# Patient Record
Sex: Male | Born: 1955 | Race: White | Hispanic: No | Smoking: Never smoker
Health system: Southern US, Community
[De-identification: ages and names within clinical notes are randomized; demographics above are authoritative.]

## PROBLEM LIST (undated history)

## (undated) DIAGNOSIS — T7840XA Allergy, unspecified, initial encounter: Secondary | ICD-10-CM

## (undated) DIAGNOSIS — K5792 Diverticulitis of intestine, part unspecified, without perforation or abscess without bleeding: Secondary | ICD-10-CM

## (undated) DIAGNOSIS — R112 Nausea with vomiting, unspecified: Secondary | ICD-10-CM

## (undated) DIAGNOSIS — E785 Hyperlipidemia, unspecified: Secondary | ICD-10-CM

## (undated) DIAGNOSIS — Z9889 Other specified postprocedural states: Secondary | ICD-10-CM

## (undated) DIAGNOSIS — I10 Essential (primary) hypertension: Secondary | ICD-10-CM

## (undated) HISTORY — DX: Diverticulitis of intestine, part unspecified, without perforation or abscess without bleeding: K57.92

## (undated) HISTORY — PX: OTHER SURGICAL HISTORY: SHX169

## (undated) HISTORY — PX: HERNIA REPAIR: SHX51

## (undated) HISTORY — DX: Allergy, unspecified, initial encounter: T78.40XA

## (undated) HISTORY — DX: Hyperlipidemia, unspecified: E78.5

## (undated) HISTORY — PX: EYE SURGERY: SHX253

## (undated) HISTORY — DX: Essential (primary) hypertension: I10

---

## 2009-12-24 HISTORY — PX: COLONOSCOPY: SHX174

## 2010-01-19 LAB — HM COLONOSCOPY

## 2012-01-09 ENCOUNTER — Ambulatory Visit (INDEPENDENT_AMBULATORY_CARE_PROVIDER_SITE_OTHER): Payer: BC Managed Care – PPO

## 2012-01-09 DIAGNOSIS — J019 Acute sinusitis, unspecified: Secondary | ICD-10-CM

## 2012-01-09 DIAGNOSIS — H66009 Acute suppurative otitis media without spontaneous rupture of ear drum, unspecified ear: Secondary | ICD-10-CM

## 2012-01-26 ENCOUNTER — Ambulatory Visit (INDEPENDENT_AMBULATORY_CARE_PROVIDER_SITE_OTHER): Payer: BC Managed Care – PPO | Admitting: Family Medicine

## 2012-01-26 VITALS — BP 118/76 | HR 78 | Temp 98.2°F | Resp 18 | Ht 73.5 in | Wt 214.6 lb

## 2012-01-26 DIAGNOSIS — E785 Hyperlipidemia, unspecified: Secondary | ICD-10-CM

## 2012-01-26 DIAGNOSIS — J019 Acute sinusitis, unspecified: Secondary | ICD-10-CM

## 2012-01-26 DIAGNOSIS — I10 Essential (primary) hypertension: Secondary | ICD-10-CM

## 2012-01-26 MED ORDER — LEVOFLOXACIN 500 MG PO TABS: 500.0000 mg | ORAL_TABLET | Freq: Every day | ORAL | Status: AC

## 2012-01-26 NOTE — Progress Notes (Signed)
  Subjective:    Patient ID: Ricardo Garcia, male    DOB: [birth year], 56 y.o.   MRN: [id number]  Cough The current episode started more than 1 month ago. The problem has been unchanged. The problem occurs constantly. The cough is non-productive. Associated symptoms include headaches and postnasal drip. Pertinent negatives include no chest pain, chills, ear congestion, ear pain, fever or sore throat. He has tried rest for the symptoms. The treatment provided mild relief. There is no history of asthma, bronchitis, COPD or emphysema.  Sore Throat  Associated symptoms include coughing and headaches. Pertinent negatives include no ear pain.  Sinusitis Associated symptoms include coughing and headaches. Pertinent negatives include no chills, ear pain or sore throat.   The [place] he took earlier in January helped but did not clear the infection   Review of Systems  Constitutional: Negative for fever and chills.  HENT: Positive for postnasal drip. Negative for ear pain and sore throat.   Respiratory: Positive for cough.   Cardiovascular: Negative for chest pain.  Neurological: Positive for headaches.       Objective:   Physical Exam  Constitutional: He is oriented to person, place, and time. He appears well-developed and well-nourished.  HENT:  Head: Normocephalic and atraumatic.  Right Ear: External ear normal.  Left Ear: External ear normal.  Eyes: Conjunctivae are normal. Pupils are equal, round, and reactive to light.  Neck: Normal range of motion. Neck supple.  Cardiovascular: Normal rate and normal heart sounds.   Pulmonary/Chest: Effort normal and breath sounds normal.  Neurological: He is alert and oriented to person, place, and time.  Skin: Skin is warm and dry.          Assessment & Plan:  Acute sinusitis over a month long.  Partial improvement with omnicef

## 2012-01-26 NOTE — Patient Instructions (Signed)

## 2012-03-07 ENCOUNTER — Other Ambulatory Visit: Payer: Self-pay | Admitting: Otolaryngology

## 2012-03-18 ENCOUNTER — Other Ambulatory Visit: Payer: Self-pay

## 2012-04-29 ENCOUNTER — Ambulatory Visit: Payer: Self-pay | Admitting: Emergency Medicine

## 2012-06-03 ENCOUNTER — Ambulatory Visit (INDEPENDENT_AMBULATORY_CARE_PROVIDER_SITE_OTHER): Payer: No Typology Code available for payment source | Admitting: Emergency Medicine

## 2012-06-03 ENCOUNTER — Encounter: Payer: Self-pay | Admitting: Emergency Medicine

## 2012-06-03 VITALS — BP 128/81 | HR 78 | Temp 97.7°F | Resp 16 | Ht 73.0 in | Wt 219.3 lb

## 2012-06-03 DIAGNOSIS — E785 Hyperlipidemia, unspecified: Secondary | ICD-10-CM

## 2012-06-03 DIAGNOSIS — I10 Essential (primary) hypertension: Secondary | ICD-10-CM

## 2012-06-03 DIAGNOSIS — E789 Disorder of lipoprotein metabolism, unspecified: Secondary | ICD-10-CM

## 2012-06-03 LAB — LIPID PANEL
Cholesterol: 165 mg/dL (ref 0–200)
Triglycerides: 77 mg/dL (ref <150)

## 2012-06-03 LAB — COMPREHENSIVE METABOLIC PANEL
Albumin: 4.4 g/dL (ref 3.5–5.2)
CO2: 26 mEq/L (ref 19–32)
Glucose, Bld: 102 mg/dL — ABNORMAL HIGH (ref 70–99)
Potassium: 4.5 mEq/L (ref 3.5–5.3)
Sodium: 138 mEq/L (ref 135–145)
Total Protein: 7 g/dL (ref 6.0–8.3)

## 2012-06-03 MED ORDER — SIMVASTATIN 40 MG PO TABS: 40.0000 mg | ORAL_TABLET | ORAL | Status: DC

## 2012-06-03 MED ORDER — CLONIDINE HCL 0.1 MG PO TABS: 0.1000 mg | ORAL_TABLET | Freq: Two times a day (BID) | ORAL | Status: DC

## 2012-06-03 MED ORDER — LISINOPRIL 10 MG PO TABS: 10.0000 mg | ORAL_TABLET | Freq: Every day | ORAL | Status: DC

## 2012-06-03 NOTE — Progress Notes (Signed)
  Subjective:    Patient ID: Ricardo Garcia, male    DOB: 04/29/1956, 56 y.o.   MRN: [id number]  HPI patient in followup high blood pressure and high cholesterol everything is essentially the same. Patient has no chest pain shortness of breath or other new complaints.    Review of Systems     Objective:   Physical Exam Betti Cruz complected male who is not in acute distress. His neck is supple chest clear heart regular rate no murmurs abdomen        Assessment & Plan:  Blood pressure is under good control we'll check a lipid panel make further changes at that time meds were refilled x1 year

## 2012-10-19 ENCOUNTER — Ambulatory Visit (INDEPENDENT_AMBULATORY_CARE_PROVIDER_SITE_OTHER): Payer: No Typology Code available for payment source | Admitting: Family Medicine

## 2012-10-19 VITALS — BP 149/94 | HR 79 | Temp 98.1°F | Resp 16 | Ht 73.0 in | Wt 227.4 lb

## 2012-10-19 DIAGNOSIS — J329 Chronic sinusitis, unspecified: Secondary | ICD-10-CM

## 2012-10-19 MED ORDER — PREDNISONE 20 MG PO TABS: ORAL_TABLET | ORAL | Status: DC

## 2012-10-19 MED ORDER — AMOXICILLIN-POT CLAVULANATE 875-125 MG PO TABS: 1.0000 | ORAL_TABLET | Freq: Two times a day (BID) | ORAL | Status: DC

## 2012-10-19 NOTE — Progress Notes (Signed)
[facility] [address], [place] [address] [phone]  Date:  10/19/2012   Name:  Ricardo Garcia   DOB:  [birth year]   MRN:  [id number]  PCP:  No primary provider on file.    Chief Complaint: Sinusitis   History of Present Illness:  Ricardo Garcia is a 56 y.o. very pleasant male patient who presents with the following:  He is here today with persistent sinus pressure and an "all day" dull headache.  He has noted these symptoms for about one month.  He has had this problem in the past several times.  He has gotten better with prednisone and antibiotics in the past. Nasal sprays have not been helpful    No fever, chills or aches. He notes a frontal HA, pressure and pain in his sinuses.  No tooth pain.  Minimal cough.  Nasal discharge is worse in the am.  No ST or earache.    He has tried sudafed- this does help.  No GI symptoms. No sneezing or itchy, runny eyes.  No other neurologic symptoms such as numbness, weakness, dropping items or coordination problems.    Patient Active Problem List  Diagnosis  . Hypertension  . Hyperlipidemia    Past Medical History  Diagnosis Date  . Hypertension     Past Surgical History  Procedure Date  . Hernia repair     History  Substance Use Topics  . Smoking status: Never Smoker   . Smokeless tobacco: Not on file  . Alcohol Use: 3.5 oz/week    7 drink(s) per week    Family History  Problem Relation Age of Onset  . Hypertension Mother     No Known Allergies  Medication list has been reviewed and updated.  Current Outpatient Prescriptions on File Prior to Visit  Medication Sig Dispense Refill  . aspirin 81 MG tablet Take 160 mg by mouth daily.      . cloNIDine (CATAPRES) 0.1 MG tablet Take 1 tablet (0.1 mg total) by mouth 2 (two) times daily.  60 tablet  11  . lisinopril (PRINIVIL,ZESTRIL) 10 MG tablet Take 1 tablet (10 mg total) by mouth daily. Rx is 20 mg takes 1/2 daily  30 tablet  11  .  simvastatin (ZOCOR) 40 MG tablet Take 1 tablet (40 mg total) by mouth every morning.  30 tablet  11    Review of Systems:  As per HPI- otherwise negative. He has seen ENT in the past for this issue- they considered getting a CT scan but did not do so as he got better.    Physical Examination: Filed Vitals:   10/19/12 0803  BP: 149/94  Pulse: 79  Temp: 98.1 F (36.7 C)  Resp: 16   Filed Vitals:   10/19/12 0803  Height: 6\' 1"  (1.854 m)  Weight: 227 lb 6.4 oz (103.148 kg)   Body mass index is 30.00 kg/(m^2). Ideal Body Weight: Weight in (lb) to have BMI = 25: 189.1   GEN: WDWN, NAD, Non-toxic, A & O x 3, overweight HEENT: Atraumatic, Normocephalic. Neck supple. No masses, No LAD.  Bilateral TM wnl, oropharynx normal.  PEERL,EOMI.   No sinus tenderness to percussion, nasal cavity is inflamed.  Ears and Nose: No external deformity. CV: RRR, No M/G/R. No JVD. No thrill. No extra heart sounds. PULM: CTA B, no wheezes, crackles, rhonchi. No retractions. No resp. distress. No accessory muscle use. EXTR: No c/c/e NEURO Normal gait.  PSYCH: Normally  interactive. Conversant. Not depressed or anxious appearing.  Calm demeanor.    Assessment and Plan: 1. Sinusitis  predniSONE (DELTASONE) 20 MG tablet, amoxicillin-clavulanate (AUGMENTIN) 875-125 MG per tablet   Recurrent sinus symptoms.  He has had success with prednisone and augmentin.  Will treat as below- if not better in the next few days please let me know- Sooner if worse.    Meds ordered this encounter  Medications  . predniSONE (DELTASONE) 20 MG tablet    Sig: Take 3 pills for 2 days, then 2 pills for 3 days, then 1 pill for 3 days    Dispense:  15 tablet    Refill:  0  . amoxicillin-clavulanate (AUGMENTIN) 875-125 MG per tablet    Sig: Take 1 tablet by mouth 2 (two) times daily.    Dispense:  20 tablet    Refill:  0     Mario Coronado, MD

## 2012-10-19 NOTE — Patient Instructions (Addendum)
Please let me know if you are not better in the next few days- in that case we may want to proceed with a CT scan of your sinuses.

## 2012-11-04 ENCOUNTER — Ambulatory Visit: Payer: No Typology Code available for payment source

## 2012-11-04 ENCOUNTER — Ambulatory Visit (INDEPENDENT_AMBULATORY_CARE_PROVIDER_SITE_OTHER): Payer: No Typology Code available for payment source | Admitting: Emergency Medicine

## 2012-11-04 ENCOUNTER — Encounter: Payer: Self-pay | Admitting: Emergency Medicine

## 2012-11-04 VITALS — BP 148/91 | HR 79 | Temp 98.2°F | Resp 16 | Ht 73.0 in | Wt 226.0 lb

## 2012-11-04 DIAGNOSIS — J3489 Other specified disorders of nose and nasal sinuses: Secondary | ICD-10-CM

## 2012-11-04 DIAGNOSIS — L309 Dermatitis, unspecified: Secondary | ICD-10-CM

## 2012-11-04 DIAGNOSIS — R195 Other fecal abnormalities: Secondary | ICD-10-CM

## 2012-11-04 DIAGNOSIS — Z0000 Encounter for general adult medical examination without abnormal findings: Secondary | ICD-10-CM

## 2012-11-04 DIAGNOSIS — J329 Chronic sinusitis, unspecified: Secondary | ICD-10-CM

## 2012-11-04 DIAGNOSIS — J019 Acute sinusitis, unspecified: Secondary | ICD-10-CM

## 2012-11-04 DIAGNOSIS — I10 Essential (primary) hypertension: Secondary | ICD-10-CM

## 2012-11-04 LAB — CBC WITH DIFFERENTIAL/PLATELET
Eosinophils Absolute: 0.1 10*3/uL (ref 0.0–0.7)
Eosinophils Relative: 2 % (ref 0–5)
HCT: 42 % (ref 39.0–52.0)
Hemoglobin: 14.6 g/dL (ref 13.0–17.0)
Lymphs Abs: 0.9 10*3/uL (ref 0.7–4.0)
MCH: 31.4 pg (ref 26.0–34.0)
MCV: 90.3 fL (ref 78.0–100.0)
Monocytes Absolute: 0.6 10*3/uL (ref 0.1–1.0)
Monocytes Relative: 9 % (ref 3–12)
RBC: 4.65 MIL/uL (ref 4.22–5.81)

## 2012-11-04 LAB — LIPID PANEL
Cholesterol: 182 mg/dL (ref 0–200)
HDL: 68 mg/dL (ref >39)
Total CHOL/HDL Ratio: 2.7 Ratio

## 2012-11-04 LAB — TSH: TSH: 1.21 u[IU]/mL (ref 0.350–4.500)

## 2012-11-04 LAB — COMPREHENSIVE METABOLIC PANEL
CO2: 27 mEq/L (ref 19–32)
Calcium: 9.9 mg/dL (ref 8.4–10.5)
Chloride: 103 mEq/L (ref 96–112)
Creat: 1.05 mg/dL (ref 0.50–1.35)
Glucose, Bld: 117 mg/dL — ABNORMAL HIGH (ref 70–99)
Total Bilirubin: 0.6 mg/dL (ref 0.3–1.2)

## 2012-11-04 LAB — POCT URINALYSIS DIPSTICK
Bilirubin, UA: NEGATIVE
Blood, UA: NEGATIVE
Glucose, UA: NEGATIVE
Nitrite, UA: NEGATIVE
Spec Grav, UA: >=1.03
pH, UA: 6.5

## 2012-11-04 LAB — POCT UA - MICROSCOPIC ONLY
Bacteria, U Microscopic: NEGATIVE
Casts, Ur, LPF, POC: NEGATIVE
Mucus, UA: NEGATIVE
WBC, Ur, HPF, POC: NEGATIVE
Yeast, UA: NEGATIVE

## 2012-11-04 MED ORDER — AMOXICILLIN-POT CLAVULANATE 875-125 MG PO TABS: 1.0000 | ORAL_TABLET | Freq: Two times a day (BID) | ORAL | Status: DC

## 2012-11-04 MED ORDER — PREDNISONE 20 MG PO TABS: ORAL_TABLET | ORAL | Status: DC

## 2012-11-04 MED ORDER — CLOBETASOL PROPIONATE 0.05 % EX SOLN: 1.0000 "application " | Freq: Two times a day (BID) | CUTANEOUS | Status: DC

## 2012-11-04 MED ORDER — HALOBETASOL PROPIONATE 0.05 % EX CREA: TOPICAL_CREAM | Freq: Two times a day (BID) | CUTANEOUS | Status: DC

## 2012-11-04 NOTE — Progress Notes (Signed)
  Subjective:    Patient ID: Ricardo Garcia, male    DOB: [birth year], 56 y.o.   MRN: [id number]  HPI patient here for physical examination. Apparently last year he has significant problem with sinusitis. This was treated eventually with Cleocin through Dr. Jodi Marble office. He recently completed a course of Augmentin and prednisone but continues to have mild sinus congestion drippy nose    Review of Systems  Constitutional: Negative.   HENT: Positive for congestion.   Eyes: Negative.   Respiratory: Negative.   Cardiovascular: Negative.   Gastrointestinal: Negative.   Genitourinary: Negative.   Musculoskeletal: Negative.   Skin: Negative.   Neurological: Negative.   Hematological: Negative.   Psychiatric/Behavioral: Negative.        Objective:   Physical Exam HEENT exam reveals redness of the turbinates there is mild sinus pain with compression over the maxillary sinuses. Throat is clear. Neck supple chest clear cardiac reveals a regular rate without murmurs there are no carotid bruits. The abdomen is soft liver and spleen not enlarged there is no tenderness GU is that of a normal male without hernias rectal reveals a normal sized prostate no nodules are felt stool is obtained for heme sure  Results for orders placed in visit on 11/04/12  POCT UA - MICROSCOPIC ONLY      Component Value Range   WBC, Ur, HPF, POC neg     RBC, urine, microscopic neg     Bacteria, U Microscopic neg     Mucus, UA neg     Epithelial cells, urine per micros 0-1     Crystals, Ur, HPF, POC neg     Casts, Ur, LPF, POC neg     Yeast, UA neg    POCT URINALYSIS DIPSTICK      Component Value Range   Color, UA yellow     Clarity, UA clear     Glucose, UA neg     Bilirubin, UA neg     Ketones, UA neg     Spec Grav, UA >=1.030     Blood, UA neg     pH, UA 6.5     Protein, UA neg     Urobilinogen, UA 0.2     Nitrite, UA neg     Leukocytes, UA Negative    IFOBT (OCCULT BLOOD)      Component Value Range     IFOBT Positive     [facility] reading (PRIMARY) by  Dr.Stephen Turnbaugh no evidence of sinusitis       Assessment & Plan:  Patient having recurrent problems with sinusitis. His blood pressure is not at goal and have encouraged him to continue to exercise and try and drop some weight since he has not exercised for the last 2 months. I've refilled his eczema medications. I've also refilled his Augmentin and prednisone that he recently took for sinusitis. His hemo  sure was positive here today patient will return in 6 weeks and we will repeat his positive heme sure that it is not still positive.

## 2012-11-05 ENCOUNTER — Encounter: Payer: Self-pay | Admitting: Physician Assistant

## 2012-11-07 ENCOUNTER — Telehealth: Payer: Self-pay

## 2012-11-07 NOTE — Telephone Encounter (Signed)
Patient is returning our phone call regarding his lab results.    BEST#:[phone]

## 2012-11-08 NOTE — Telephone Encounter (Signed)
See labs 

## 2012-12-20 ENCOUNTER — Ambulatory Visit (INDEPENDENT_AMBULATORY_CARE_PROVIDER_SITE_OTHER): Payer: No Typology Code available for payment source | Admitting: Family Medicine

## 2012-12-20 VITALS — BP 121/78 | HR 81 | Temp 98.7°F | Resp 16 | Ht 73.5 in | Wt 225.0 lb

## 2012-12-20 DIAGNOSIS — J329 Chronic sinusitis, unspecified: Secondary | ICD-10-CM

## 2012-12-20 MED ORDER — FLUTICASONE PROPIONATE 50 MCG/ACT NA SUSP: 2.0000 | Freq: Every day | NASAL | Status: DC

## 2012-12-20 MED ORDER — CLINDAMYCIN HCL 300 MG PO CAPS: 300.0000 mg | ORAL_CAPSULE | Freq: Three times a day (TID) | ORAL | Status: DC

## 2012-12-20 NOTE — Progress Notes (Signed)
Subjective: Patient seemed to have problems with his sinuses. He's had 2 cores of a course of Augmentin. He says last year clindamycin finally cleared him. He has seen an ENT about this once. He never did have scan done.  Objective TMs normal. Inflamed his right nasal passage with some dry blood up in there. His throat was clear. Neck supple without nodes. Chest clear. Heart regular without murmurs.  Assessment: Chronic sinusitis  Plan: Clindamycin 300 3 times a day. Discussed the risks. Patient asked me about use of his Nettie pot. I said I was not inclined to encourage those to strongly but it was up for him.  Flonase  Return if not improved. He sees Dr. Cleta Alberts back in several weeks anyhow.

## 2012-12-20 NOTE — Patient Instructions (Addendum)
Take clindamycin as ordered.  If bad diarrhea or any bleeding in stool return to office and discontinue medicine.

## 2013-01-06 ENCOUNTER — Ambulatory Visit: Payer: No Typology Code available for payment source | Admitting: Emergency Medicine

## 2013-01-16 ENCOUNTER — Ambulatory Visit (INDEPENDENT_AMBULATORY_CARE_PROVIDER_SITE_OTHER): Payer: No Typology Code available for payment source | Admitting: Emergency Medicine

## 2013-01-16 VITALS — BP 133/80 | HR 101 | Temp 98.8°F | Resp 18 | Ht 73.0 in | Wt 224.0 lb

## 2013-01-16 DIAGNOSIS — J329 Chronic sinusitis, unspecified: Secondary | ICD-10-CM

## 2013-01-16 DIAGNOSIS — R195 Other fecal abnormalities: Secondary | ICD-10-CM

## 2013-01-16 LAB — IFOBT (OCCULT BLOOD): IFOBT: NEGATIVE

## 2013-01-16 NOTE — Progress Notes (Signed)
  Subjective:    Patient ID: Ricardo Garcia, male    DOB: 1956-10-10, 57 y.o.   MRN: [id number]  HPI patient enters for recheck he tested positive for occult blood. He is in today for recheck he did. He continues to battle with severe recurrent sinusitis. He is seeing Dr. Tia Masker in the past for this and is interested in seeing he denies any GI symptoms the    Review of Systems     Objective:   Physical Exam TMs are clear. Nose is slightly congested. Throat is clear. Neck supple chest was clear examination of the perianal area shows at that area appears normal rectal exam was performed and there were no masses felt he may sure was obtained  Results for orders placed in visit on 01/16/13  IFOBT (OCCULT BLOOD)      Component Value Range   IFOBT Negative          Assessment & Plan:  Stool is now negative for occult blood. Make referral to ENT for chronic sinusitis.

## 2013-04-20 ENCOUNTER — Other Ambulatory Visit: Payer: Self-pay

## 2013-04-20 DIAGNOSIS — E785 Hyperlipidemia, unspecified: Secondary | ICD-10-CM

## 2013-04-20 DIAGNOSIS — I10 Essential (primary) hypertension: Secondary | ICD-10-CM

## 2013-04-20 DIAGNOSIS — J329 Chronic sinusitis, unspecified: Secondary | ICD-10-CM

## 2013-04-20 MED ORDER — SIMVASTATIN 40 MG PO TABS: 40.0000 mg | ORAL_TABLET | ORAL | Status: DC

## 2013-04-20 MED ORDER — FLUTICASONE PROPIONATE 50 MCG/ACT NA SUSP: 2.0000 | Freq: Every day | NASAL | Status: DC

## 2013-04-20 MED ORDER — LISINOPRIL 10 MG PO TABS: 10.0000 mg | ORAL_TABLET | Freq: Every day | ORAL | Status: DC

## 2013-04-20 MED ORDER — CLONIDINE HCL 0.1 MG PO TABS: 0.1000 mg | ORAL_TABLET | Freq: Two times a day (BID) | ORAL | Status: DC

## 2013-07-22 ENCOUNTER — Other Ambulatory Visit: Payer: Self-pay | Admitting: Emergency Medicine

## 2013-07-28 ENCOUNTER — Ambulatory Visit (INDEPENDENT_AMBULATORY_CARE_PROVIDER_SITE_OTHER): Payer: BC Managed Care – PPO | Admitting: Emergency Medicine

## 2013-07-28 ENCOUNTER — Encounter: Payer: Self-pay | Admitting: Emergency Medicine

## 2013-07-28 VITALS — BP 140/82 | HR 65 | Temp 98.5°F | Resp 16 | Ht 73.0 in | Wt 221.0 lb

## 2013-07-28 DIAGNOSIS — L309 Dermatitis, unspecified: Secondary | ICD-10-CM

## 2013-07-28 DIAGNOSIS — J329 Chronic sinusitis, unspecified: Secondary | ICD-10-CM

## 2013-07-28 DIAGNOSIS — I10 Essential (primary) hypertension: Secondary | ICD-10-CM

## 2013-07-28 DIAGNOSIS — E785 Hyperlipidemia, unspecified: Secondary | ICD-10-CM

## 2013-07-28 DIAGNOSIS — Z79899 Other long term (current) drug therapy: Secondary | ICD-10-CM

## 2013-07-28 LAB — CBC WITH DIFFERENTIAL/PLATELET
HCT: 42.8 % (ref 39.0–52.0)
Hemoglobin: 14.9 g/dL (ref 13.0–17.0)
Lymphocytes Relative: 17 % (ref 12–46)
Lymphs Abs: 1.1 10*3/uL (ref 0.7–4.0)
MCHC: 34.8 g/dL (ref 30.0–36.0)
Monocytes Absolute: 0.6 10*3/uL (ref 0.1–1.0)
Monocytes Relative: 9 % (ref 3–12)
Neutro Abs: 4.4 10*3/uL (ref 1.7–7.7)
RBC: 4.69 MIL/uL (ref 4.22–5.81)
WBC: 6.3 10*3/uL (ref 4.0–10.5)

## 2013-07-28 LAB — LIPID PANEL
Cholesterol: 167 mg/dL (ref 0–200)
LDL Cholesterol: 78 mg/dL (ref 0–99)
Total CHOL/HDL Ratio: 2.5 Ratio
Triglycerides: 114 mg/dL (ref <150)
VLDL: 23 mg/dL (ref 0–40)

## 2013-07-28 LAB — COMPREHENSIVE METABOLIC PANEL
ALT: 23 U/L (ref 0–53)
Alkaline Phosphatase: 50 U/L (ref 39–117)
CO2: 28 mEq/L (ref 19–32)
Creat: 1.17 mg/dL (ref 0.50–1.35)
Sodium: 139 mEq/L (ref 135–145)
Total Bilirubin: 0.6 mg/dL (ref 0.3–1.2)
Total Protein: 6.8 g/dL (ref 6.0–8.3)

## 2013-07-28 MED ORDER — LISINOPRIL 10 MG PO TABS: ORAL_TABLET | ORAL | Status: DC

## 2013-07-28 MED ORDER — CLONIDINE HCL 0.1 MG PO TABS: 0.1000 mg | ORAL_TABLET | Freq: Two times a day (BID) | ORAL | Status: DC

## 2013-07-28 MED ORDER — FLUTICASONE PROPIONATE 50 MCG/ACT NA SUSP: NASAL | Status: DC

## 2013-07-28 MED ORDER — HALOBETASOL PROPIONATE 0.05 % EX CREA: TOPICAL_CREAM | Freq: Two times a day (BID) | CUTANEOUS | Status: DC

## 2013-07-28 MED ORDER — SIMVASTATIN 40 MG PO TABS: ORAL_TABLET | ORAL | Status: DC

## 2013-07-28 MED ORDER — CLOBETASOL PROPIONATE 0.05 % EX SOLN: 1.0000 "application " | Freq: Two times a day (BID) | CUTANEOUS | Status: DC

## 2013-07-28 MED ORDER — MUPIROCIN 2 % EX OINT: TOPICAL_OINTMENT | Freq: Every day | CUTANEOUS | Status: DC

## 2013-07-28 NOTE — Progress Notes (Signed)
  Subjective:    Patient ID: Ileene Musa, male    DOB: 10-May-1956, 57 y.o.   MRN: [id number]  HPI patient here to followup hypertension and hyperlipidemia. His son has been having great deal of difficulty with anxiety and depression and he is currently seeing Dr. Merla Riches. He also is bothered with chronic sinus problems which required multiple doses of antibiotics. He currently uses an 80 pod about 3 times a day along with Neosporin and this has seemed to help .    Review of Systems     Objective:   Physical Exam patient is alert and cooperative. His neck is supple. Examination of the nose reveals crusting scab-like areas on the left nares. His chest is clear his heart is regular rate without murmurs abdomen soft nontender and        Assessment & Plan:  He was not happy with the last ENT he saw. I told him I can order a CT of the sinuses or refer him for second opinion from a different ENT. He was to continue his med he pod. I told him I would call in some mucopurulent nasal ointment to use along with an 80 pod and see if this helps. His other medications were refilled. Routine labs were done he is to return in 6 months for his physical

## 2013-08-20 ENCOUNTER — Ambulatory Visit (INDEPENDENT_AMBULATORY_CARE_PROVIDER_SITE_OTHER): Payer: BC Managed Care – PPO | Admitting: Emergency Medicine

## 2013-08-20 VITALS — BP 124/76 | HR 83 | Temp 100.0°F | Resp 16 | Ht 73.0 in | Wt 225.0 lb

## 2013-08-20 DIAGNOSIS — K5732 Diverticulitis of large intestine without perforation or abscess without bleeding: Secondary | ICD-10-CM

## 2013-08-20 DIAGNOSIS — R109 Unspecified abdominal pain: Secondary | ICD-10-CM

## 2013-08-20 LAB — POCT CBC
HCT, POC: 42.7 % — AB (ref 43.5–53.7)
Hemoglobin: 13.5 g/dL — AB (ref 14.1–18.1)
MPV: 8 fL (ref 0–99.8)
POC Granulocyte: 11.3 — AB (ref 2–6.9)
POC MID %: 6.2 %M (ref 0–12)
RBC: 4.37 M/uL — AB (ref 4.69–6.13)

## 2013-08-20 LAB — POCT URINALYSIS DIPSTICK
Blood, UA: NEGATIVE
Nitrite, UA: NEGATIVE
Protein, UA: NEGATIVE
Spec Grav, UA: 1.02
Urobilinogen, UA: 0.2

## 2013-08-20 MED ORDER — METRONIDAZOLE 500 MG PO TABS: 500.0000 mg | ORAL_TABLET | Freq: Two times a day (BID) | ORAL | Status: DC

## 2013-08-20 MED ORDER — CIPROFLOXACIN HCL 500 MG PO TABS: 500.0000 mg | ORAL_TABLET | Freq: Two times a day (BID) | ORAL | Status: DC

## 2013-08-20 NOTE — Patient Instructions (Addendum)
Diverticulitis °A diverticulum is a small pouch or sac on the colon. Diverticulosis is the presence of these diverticula on the colon. Diverticulitis is the irritation (inflammation) or infection of diverticula. °CAUSES  °The colon and its diverticula contain bacteria. If food particles block the tiny opening to a diverticulum, the bacteria inside can grow and cause an increase in pressure. This leads to infection and inflammation and is called diverticulitis. °SYMPTOMS  °· Abdominal pain and tenderness. Usually, the pain is located on the left side of your abdomen. However, it could be located elsewhere. °· Fever. °· Bloating. °· Feeling sick to your stomach (nausea). °· Throwing up (vomiting). °· Abnormal stools. °DIAGNOSIS  °Your caregiver will take a history and perform a physical exam. Since many things can cause abdominal pain, other tests may be necessary. Tests may include: °· Blood tests. °· Urine tests. °· X-ray of the abdomen. °· CT scan of the abdomen. °Sometimes, surgery is needed to determine if diverticulitis or other conditions are causing your symptoms. °TREATMENT  °Most of the time, you can be treated without surgery. Treatment includes: °· Resting the bowels by only having liquids for a few days. As you improve, you will need to eat a low-fiber diet. °· Intravenous (IV) fluids if you are losing body fluids (dehydrated). °· Antibiotic medicines that treat infections may be given. °· Pain and nausea medicine, if needed. °· Surgery if the inflamed diverticulum has burst. °HOME CARE INSTRUCTIONS  °· Try a clear liquid diet (broth, tea, or water for as long as directed by your caregiver). You may then gradually begin a low-fiber diet as tolerated.  °A low-fiber diet is a diet with less than 10 grams of fiber. Choose the foods below to reduce fiber in the diet: °· White breads, cereals, rice, and pasta. °· Cooked fruits and vegetables or soft fresh fruits and vegetables without the skin. °· Ground or  well-cooked tender beef, ham, veal, lamb, pork, or poultry. °· Eggs and seafood. °· After your diverticulitis symptoms have improved, your caregiver may put you on a high-fiber diet. A high-fiber diet includes 14 grams of fiber for every 1000 calories consumed. For a standard 2000 calorie diet, you would need 28 grams of fiber. Follow these diet guidelines to help you increase the fiber in your diet. It is important to slowly increase the amount fiber in your diet to avoid gas, constipation, and bloating. °· Choose whole-grain breads, cereals, pasta, and brown rice. °· Choose fresh fruits and vegetables with the skin on. Do not overcook vegetables because the more vegetables are cooked, the more fiber is lost. °· Choose more nuts, seeds, legumes, dried peas, beans, and lentils. °· Look for food products that have greater than 3 grams of fiber per serving on the Nutrition Facts label. °· Take all medicine as directed by your caregiver. °· If your caregiver has given you a follow-up appointment, it is very important that you go. Not going could result in lasting (chronic) or permanent injury, pain, and disability. If there is any problem keeping the appointment, call to reschedule. °SEEK MEDICAL CARE IF:  °· Your pain does not improve. °· You have a hard time advancing your diet beyond clear liquids. °· Your bowel movements do not return to normal. °SEEK IMMEDIATE MEDICAL CARE IF:  °· Your pain becomes worse. °· You have an oral temperature above 102° F (38.9° C), not controlled by medicine. °· You have repeated vomiting. °· You have bloody or black, tarry stools. °·   Symptoms that brought you to your caregiver become worse or are not getting better. °MAKE SURE YOU:  °· Understand these instructions. °· Will watch your condition. °· Will get help right away if you are not doing well or get worse. °Document Released: 09/19/2005 Document Revised: 03/03/2012 Document Reviewed: 01/15/2011 °[employer]® Patient Information  ©2014 [employer]. ° °

## 2013-08-20 NOTE — Progress Notes (Signed)
[facility] [address], [place] [address] [phone]  Date:  08/20/2013   Name:  Ricardo Garcia   DOB:  [birth year]   MRN:  [id number]  PCP:  No primary provider on file.    Chief Complaint: Abdominal Pain   History of Present Illness:  Ricardo Garcia is a 57 y.o. very pleasant male patient who presents with the following:  Abdominal pain that he describes as suprapubic in location for the past two days.  Fatigued and anorectic.  No dysuria, urgency or frequency.  No discharge.  No prostatism symptoms.  No vomiting or stool change.  Nauseated.    Patient Active Problem List   Diagnosis Date Noted  . Unspecified sinusitis (chronic) 07/28/2013  . Hypertension 01/26/2012  . Hyperlipidemia 01/26/2012    Past Medical History  Diagnosis Date  . Hypertension     Past Surgical History  Procedure Laterality Date  . Hernia repair      History  Substance Use Topics  . Smoking status: Never Smoker   . Smokeless tobacco: Not on file  . Alcohol Use: 3.5 oz/week    7 drink(s) per week    Family History  Problem Relation Age of Onset  . Hypertension Mother     No Known Allergies  Medication list has been reviewed and updated.  Current Outpatient Prescriptions on File Prior to Visit  Medication Sig Dispense Refill  . aspirin 81 MG tablet Take 160 mg by mouth daily.      . cloNIDine (CATAPRES) 0.1 MG tablet Take 1 tablet (0.1 mg total) by mouth 2 (two) times daily.  180 tablet  3  . fluticasone (FLONASE) 50 MCG/ACT nasal spray SPRAY TWICE IN EACH NOSTRIL EVERY DAY  16 g  11  . lisinopril (PRINIVIL,ZESTRIL) 10 MG tablet TAKE 1 TABLET BY MOUTH EVERY DAY  90 tablet  3  . simvastatin (ZOCOR) 40 MG tablet TAKE 1 TABLET BY MOUTH EVERY MORNING  90 tablet  3  . clindamycin (CLEOCIN) 300 MG capsule Take 1 capsule (300 mg total) by mouth 3 (three) times daily.  30 capsule  0  . clobetasol (TEMOVATE) 0.05 % external solution Apply 1 application topically 2  (two) times daily.  50 mL  5  . halobetasol (ULTRAVATE) 0.05 % cream Apply topically 2 (two) times daily.  50 g  5  . mupirocin ointment (BACTROBAN) 2 % Apply topically daily.  22 g  0   No current facility-administered medications on file prior to visit.    Review of Systems:  As per HPI, otherwise negative.     Physical Examination: Filed Vitals:   08/20/13 1716  BP: 124/76  Pulse: 83  Temp: 100 F (37.8 C)  Resp: 16   Filed Vitals:   08/20/13 1716  Height: 6\' 1"  (1.854 m)  Weight: 225 lb (102.059 kg)   Body mass index is 29.69 kg/(m^2). Ideal Body Weight: Weight in (lb) to have BMI = 25: 189.1 GEN: WDWN, NAD, Non-toxic, A & O x 3 HEENT: Atraumatic, Normocephalic. Neck supple. No masses, No LAD. Ears and Nose: No external deformity. CV: RRR, No M/G/R. No JVD. No thrill. No extra heart sounds. PULM: CTA B, no wheezes, crackles, rhonchi. No retractions. No resp. distress. No accessory muscle use. ABD: S, tender LLQ with guarding ND, +BS. Direct and referred LLQ rebound. No HSM. EXTR: No c/c/e NEURO Normal gait.  PSYCH: Normally interactive. Conversant. Not depressed or anxious appearing.  Calm demeanor.  Assessment and Plan: Diverticulitis Flagyl cipro  Signed,  Phillips Odor, MD

## 2013-10-01 ENCOUNTER — Ambulatory Visit: Payer: Self-pay | Admitting: Emergency Medicine

## 2013-10-01 VITALS — BP 118/74 | HR 78 | Temp 98.3°F | Resp 18 | Ht 73.5 in | Wt 212.0 lb

## 2013-10-01 DIAGNOSIS — R109 Unspecified abdominal pain: Secondary | ICD-10-CM

## 2013-10-01 LAB — POCT CBC
MCH, POC: 31.4 pg — AB (ref 27–31.2)
MCV: 97.2 fL — AB (ref 80–97)
MID (cbc): 0.7 (ref 0–0.9)
POC LYMPH PERCENT: 12.3 %L (ref 10–50)
Platelet Count, POC: 229 10*3/uL (ref 142–424)
RBC: 4.72 M/uL (ref 4.69–6.13)
RDW, POC: 13.3 %
WBC: 10.6 10*3/uL — AB (ref 4.6–10.2)

## 2013-10-01 LAB — POCT URINALYSIS DIPSTICK
Glucose, UA: NEGATIVE
Spec Grav, UA: 1.015
Urobilinogen, UA: 0.2

## 2013-10-01 MED ORDER — CIPROFLOXACIN HCL 500 MG PO TABS: 500.0000 mg | ORAL_TABLET | Freq: Two times a day (BID) | ORAL | Status: DC

## 2013-10-01 MED ORDER — METRONIDAZOLE 500 MG PO TABS: 500.0000 mg | ORAL_TABLET | Freq: Two times a day (BID) | ORAL | Status: DC

## 2013-10-01 NOTE — Progress Notes (Signed)
[address]   [place], [place]  [address]   [phone]  Subjective:    Patient ID: Ricardo Garcia, male    DOB: 06-27-1956, 57 y.o.   MRN: [id number]  This chart was scribed for Lesle Chris, MD by Blanchard Kelch, ED Scribe. The patient was seen in room 12. Patient's care was started at 9:45 AM.   HPI  HPI Comments: Ricardo Garcia is a 57 y.o. male who presents to the Emergency Department for a potential reappearance of diverticulitis that began about three days ago. He describes the pain as a twinge in his abdomen. The pain has been worsening and he was unable to sleep last night. He denies fever, chills, vomiting or loss of appetite. His first episode of diverticulitis was about a month ago. He took all of his antibiotics and was feeling normal with no changes in bowel movements or fever prior to this reappearance. He did not get an x-ray with the last episode. His last colonoscopy was three years ago.  He has a family history of diverticulitis on his father's side. He did not want to get an abdominal x-ray done today because he is currently in between jobs and does not have insurance.  Past Medical History  Diagnosis Date  . Hypertension    Past Surgical History  Procedure Laterality Date  . Hernia repair     Family History  Problem Relation Age of Onset  . Hypertension Mother    History   Social History  . Marital Status: Married    Spouse Name: N/A    Number of Children: N/A  . Years of Education: N/A   Occupational History  . Not on file.   Social History Main Topics  . Smoking status: Never Smoker   . Smokeless tobacco: Not on file  . Alcohol Use: 3.5 oz/week    7 drink(s) per week  . Drug Use: No  . Sexual Activity: Yes    Partners: Female   Other Topics Concern  . Not on file   Social History Narrative  . No narrative on file   No Known Allergies Current Outpatient Prescriptions on File Prior to Visit  Medication Sig Dispense Refill  . aspirin 81 MG  tablet Take 160 mg by mouth daily.      . clobetasol (TEMOVATE) 0.05 % external solution Apply 1 application topically 2 (two) times daily.  50 mL  5  . cloNIDine (CATAPRES) 0.1 MG tablet Take 1 tablet (0.1 mg total) by mouth 2 (two) times daily.  180 tablet  3  . fluticasone (FLONASE) 50 MCG/ACT nasal spray SPRAY TWICE IN EACH NOSTRIL EVERY DAY  16 g  11  . halobetasol (ULTRAVATE) 0.05 % cream Apply topically 2 (two) times daily.  50 g  5  . lisinopril (PRINIVIL,ZESTRIL) 10 MG tablet TAKE 1 TABLET BY MOUTH EVERY DAY  90 tablet  3  . mupirocin ointment (BACTROBAN) 2 % Apply topically daily.  22 g  0  . simvastatin (ZOCOR) 40 MG tablet TAKE 1 TABLET BY MOUTH EVERY MORNING  90 tablet  3  . ciprofloxacin (CIPRO) 500 MG tablet Take 1 tablet (500 mg total) by mouth 2 (two) times daily.  20 tablet  0  . clindamycin (CLEOCIN) 300 MG capsule Take 1 capsule (300 mg total) by mouth 3 (three) times daily.  30 capsule  0  . metroNIDAZOLE (FLAGYL) 500 MG tablet Take 1 tablet (500 mg total) by mouth 2 (two) times daily  with a meal. DO NOT CONSUME ALCOHOL WHILE TAKING THIS MEDICATION.  20 tablet  0   No current facility-administered medications on file prior to visit.      Review of Systems  Constitutional: Negative for fever, chills and appetite change.  HENT: Negative.   Gastrointestinal: Positive for abdominal pain. Negative for vomiting.       Objective:   Physical Exam  Nursing note and vitals reviewed. Constitutional: He is oriented to person, place, and time. He appears well-developed and well-nourished.  HENT:  Head: Normocephalic and atraumatic.  Eyes: EOM are normal.  Neck: Normal range of motion. Neck supple.  Cardiovascular: Normal rate and regular rhythm.   Pulmonary/Chest: Effort normal and breath sounds normal. No respiratory distress.  Abdominal: Bowel sounds are normal. There is tenderness. There is no rebound and no guarding.  Exquisite tenderness deep lower left quadrant.    Neurological: He is alert and oriented to person, place, and time.  Skin: Skin is warm and dry.          Assessment & Plan:   Orders Placed This Encounter  Procedures  . POCT CBC  . POCT urinalysis dipstick   Problem List Items Addressed This Visit   None    Visit Diagnoses   Abdominal  pain, other specified site    -  Primary    Relevant Orders       POCT CBC       POCT urinalysis dipstick      Results for orders placed in visit on 10/01/13  POCT CBC      Result Value Range   WBC 10.6 (*) 4.6 - 10.2 K/uL   Lymph, poc 1.3  0.6 - 3.4   POC LYMPH PERCENT 12.3  10 - 50 %L   MID (cbc) 0.7  0 - 0.9   POC MID % 6.2  0 - 12 %M   POC Granulocyte 8.6 (*) 2 - 6.9   Granulocyte percent 81.5 (*) 37 - 80 %G   RBC 4.72  4.69 - 6.13 M/uL   Hemoglobin 14.8  14.1 - 18.1 g/dL   HCT, POC 16.1  09.6 - 53.7 %   MCV 97.2 (*) 80 - 97 fL   MCH, POC 31.4 (*) 27 - 31.2 pg   MCHC 32.2  31.8 - 35.4 g/dL   RDW, POC 04.5     Platelet Count, POC 229  142 - 424 K/uL   MPV 9.1  0 - 99.8 fL  POCT URINALYSIS DIPSTICK      Result Value Range   Color, UA yellow     Clarity, UA clear     Glucose, UA neg     Bilirubin, UA neg     Ketones, UA neg     Spec Grav, UA 1.015     Blood, UA trace-intact     pH, UA 7.0     Protein, UA neg     Urobilinogen, UA 0.2     Nitrite, UA neg     Leukocytes, UA Negative     White count is up slightly. Physical exam and history are most consistent with a flare of his diverticulitis. We'll treat with Cipro and Flagyl. He was cautioned about drinking. I would like to scan his abdomen once he has insurance. He is up-to-date on his colonoscopy.  I personally performed the services described in this documentation, which was scribed in my presence. The recorded information has been reviewed and is accurate.

## 2013-10-01 NOTE — Patient Instructions (Signed)
Diverticulitis °A diverticulum is a small pouch or sac on the colon. Diverticulosis is the presence of these diverticula on the colon. Diverticulitis is the irritation (inflammation) or infection of diverticula. °CAUSES  °The colon and its diverticula contain bacteria. If food particles block the tiny opening to a diverticulum, the bacteria inside can grow and cause an increase in pressure. This leads to infection and inflammation and is called diverticulitis. °SYMPTOMS  °· Abdominal pain and tenderness. Usually, the pain is located on the left side of your abdomen. However, it could be located elsewhere. °· Fever. °· Bloating. °· Feeling sick to your stomach (nausea). °· Throwing up (vomiting). °· Abnormal stools. °DIAGNOSIS  °Your caregiver will take a history and perform a physical exam. Since many things can cause abdominal pain, other tests may be necessary. Tests may include: °· Blood tests. °· Urine tests. °· X-ray of the abdomen. °· CT scan of the abdomen. °Sometimes, surgery is needed to determine if diverticulitis or other conditions are causing your symptoms. °TREATMENT  °Most of the time, you can be treated without surgery. Treatment includes: °· Resting the bowels by only having liquids for a few days. As you improve, you will need to eat a low-fiber diet. °· Intravenous (IV) fluids if you are losing body fluids (dehydrated). °· Antibiotic medicines that treat infections may be given. °· Pain and nausea medicine, if needed. °· Surgery if the inflamed diverticulum has burst. °HOME CARE INSTRUCTIONS  °· Try a clear liquid diet (broth, tea, or water for as long as directed by your caregiver). You may then gradually begin a low-fiber diet as tolerated.  °A low-fiber diet is a diet with less than 10 grams of fiber. Choose the foods below to reduce fiber in the diet: °· White breads, cereals, rice, and pasta. °· Cooked fruits and vegetables or soft fresh fruits and vegetables without the skin. °· Ground or  well-cooked tender beef, ham, veal, lamb, pork, or poultry. °· Eggs and seafood. °· After your diverticulitis symptoms have improved, your caregiver may put you on a high-fiber diet. A high-fiber diet includes 14 grams of fiber for every 1000 calories consumed. For a standard 2000 calorie diet, you would need 28 grams of fiber. Follow these diet guidelines to help you increase the fiber in your diet. It is important to slowly increase the amount fiber in your diet to avoid gas, constipation, and bloating. °· Choose whole-grain breads, cereals, pasta, and brown rice. °· Choose fresh fruits and vegetables with the skin on. Do not overcook vegetables because the more vegetables are cooked, the more fiber is lost. °· Choose more nuts, seeds, legumes, dried peas, beans, and lentils. °· Look for food products that have greater than 3 grams of fiber per serving on the Nutrition Facts label. °· Take all medicine as directed by your caregiver. °· If your caregiver has given you a follow-up appointment, it is very important that you go. Not going could result in lasting (chronic) or permanent injury, pain, and disability. If there is any problem keeping the appointment, call to reschedule. °SEEK MEDICAL CARE IF:  °· Your pain does not improve. °· You have a hard time advancing your diet beyond clear liquids. °· Your bowel movements do not return to normal. °SEEK IMMEDIATE MEDICAL CARE IF:  °· Your pain becomes worse. °· You have an oral temperature above 102° F (38.9° C), not controlled by medicine. °· You have repeated vomiting. °· You have bloody or black, tarry stools. °·   Symptoms that brought you to your caregiver become worse or are not getting better. °MAKE SURE YOU:  °· Understand these instructions. °· Will watch your condition. °· Will get help right away if you are not doing well or get worse. °Document Released: 09/19/2005 Document Revised: 03/03/2012 Document Reviewed: 01/15/2011 °[employer]® Patient Information  ©2014 [employer]. ° °

## 2014-01-26 ENCOUNTER — Ambulatory Visit (INDEPENDENT_AMBULATORY_CARE_PROVIDER_SITE_OTHER): Payer: BC Managed Care – PPO | Admitting: Emergency Medicine

## 2014-01-26 ENCOUNTER — Encounter: Payer: Self-pay | Admitting: Emergency Medicine

## 2014-01-26 VITALS — BP 130/90 | HR 72 | Temp 98.2°F | Resp 16 | Ht 74.0 in | Wt 228.6 lb

## 2014-01-26 DIAGNOSIS — Z125 Encounter for screening for malignant neoplasm of prostate: Secondary | ICD-10-CM

## 2014-01-26 DIAGNOSIS — J329 Chronic sinusitis, unspecified: Secondary | ICD-10-CM

## 2014-01-26 DIAGNOSIS — I10 Essential (primary) hypertension: Secondary | ICD-10-CM

## 2014-01-26 DIAGNOSIS — Z0000 Encounter for general adult medical examination without abnormal findings: Secondary | ICD-10-CM

## 2014-01-26 DIAGNOSIS — E785 Hyperlipidemia, unspecified: Secondary | ICD-10-CM

## 2014-01-26 LAB — POCT URINALYSIS DIPSTICK
BILIRUBIN UA: NEGATIVE
Blood, UA: NEGATIVE
GLUCOSE UA: NEGATIVE
Ketones, UA: NEGATIVE
Leukocytes, UA: NEGATIVE
NITRITE UA: NEGATIVE
Protein, UA: NEGATIVE
Spec Grav, UA: 1.015
Urobilinogen, UA: 0.2
pH, UA: 5.5

## 2014-01-26 LAB — CBC WITH DIFFERENTIAL/PLATELET
BASOS ABS: 0 10*3/uL (ref 0.0–0.1)
BASOS PCT: 1 % (ref 0–1)
Eosinophils Absolute: 0.2 10*3/uL (ref 0.0–0.7)
Eosinophils Relative: 3 % (ref 0–5)
HCT: 45.1 % (ref 39.0–52.0)
Hemoglobin: 15.7 g/dL (ref 13.0–17.0)
LYMPHS PCT: 15 % (ref 12–46)
Lymphs Abs: 1.1 10*3/uL (ref 0.7–4.0)
MCH: 30.9 pg (ref 26.0–34.0)
MCHC: 34.8 g/dL (ref 30.0–36.0)
MCV: 88.8 fL (ref 78.0–100.0)
Monocytes Absolute: 0.9 10*3/uL (ref 0.1–1.0)
Monocytes Relative: 12 % (ref 3–12)
NEUTROS ABS: 5 10*3/uL (ref 1.7–7.7)
NEUTROS PCT: 69 % (ref 43–77)
Platelets: 223 10*3/uL (ref 150–400)
RBC: 5.08 MIL/uL (ref 4.22–5.81)
RDW: 14 % (ref 11.5–15.5)
WBC: 7.3 10*3/uL (ref 4.0–10.5)

## 2014-01-26 LAB — LIPID PANEL
CHOL/HDL RATIO: 2.4 ratio
Cholesterol: 186 mg/dL (ref 0–200)
HDL: 76 mg/dL (ref >39)
LDL CALC: 92 mg/dL (ref 0–99)
Triglycerides: 92 mg/dL (ref <150)
VLDL: 18 mg/dL (ref 0–40)

## 2014-01-26 LAB — COMPLETE METABOLIC PANEL WITH GFR
ALBUMIN: 4.9 g/dL (ref 3.5–5.2)
ALK PHOS: 54 U/L (ref 39–117)
ALT: 28 U/L (ref 0–53)
AST: 26 U/L (ref 0–37)
BUN: 15 mg/dL (ref 6–23)
CHLORIDE: 97 meq/L (ref 96–112)
CO2: 28 mEq/L (ref 19–32)
Calcium: 10.3 mg/dL (ref 8.4–10.5)
Creat: 1.18 mg/dL (ref 0.50–1.35)
GFR, Est African American: 78 mL/min
GFR, Est Non African American: 68 mL/min
Glucose, Bld: 103 mg/dL — ABNORMAL HIGH (ref 70–99)
POTASSIUM: 4.7 meq/L (ref 3.5–5.3)
SODIUM: 136 meq/L (ref 135–145)
TOTAL PROTEIN: 7.7 g/dL (ref 6.0–8.3)
Total Bilirubin: 0.7 mg/dL (ref 0.2–1.2)

## 2014-01-26 LAB — TSH: TSH: 1.573 u[IU]/mL (ref 0.350–4.500)

## 2014-01-26 LAB — PSA: PSA: 0.83 ng/mL (ref <=4.00)

## 2014-01-26 LAB — IFOBT (OCCULT BLOOD): IMMUNOLOGICAL FECAL OCCULT BLOOD TEST: NEGATIVE

## 2014-01-26 NOTE — Progress Notes (Signed)
Subjective:    Patient ID: Ricardo Garcia, male    DOB: [birth year], 58 y.o.   MRN: [id number] This chart was scribed for Darlyne Russian, MD by Rolanda Lundborg, ED Scribe. This patient was seen in room 21 and the patient's care was started at 8:05 AM.  No chief complaint on file.  HPI HPI Comments: Ricardo Garcia is a 58 y.o. male who presents to [facility] complaining of persistent sinus pressure with headaches for the past 15 months. Pt states he went to Dr Benjamine Mola with [facility] and was given saline spray with no relief. He was given the option of getting a CT but states he was unable to afford it. Pt is asking for a referral to Dr Azzie Glatter in [place]. He had a flu shot this year. Pt has a new job with IT.  Past Medical History  Diagnosis Date  . Hypertension    Current Outpatient Prescriptions on File Prior to Visit  Medication Sig Dispense Refill  . aspirin 81 MG tablet Take 160 mg by mouth daily.      . cloNIDine (CATAPRES) 0.1 MG tablet Take 1 tablet (0.1 mg total) by mouth 2 (two) times daily.  180 tablet  3  . fluticasone (FLONASE) 50 MCG/ACT nasal spray SPRAY TWICE IN EACH NOSTRIL EVERY DAY  16 g  11  . lisinopril (PRINIVIL,ZESTRIL) 10 MG tablet TAKE 1 TABLET BY MOUTH EVERY DAY  90 tablet  3  . simvastatin (ZOCOR) 40 MG tablet TAKE 1 TABLET BY MOUTH EVERY MORNING  90 tablet  3  . clindamycin (CLEOCIN) 300 MG capsule Take 1 capsule (300 mg total) by mouth 3 (three) times daily.  30 capsule  0  . clobetasol (TEMOVATE) 0.05 % external solution Apply 1 application topically 2 (two) times daily.  50 mL  5  . halobetasol (ULTRAVATE) 0.05 % cream Apply topically 2 (two) times daily.  50 g  5  . metroNIDAZOLE (FLAGYL) 500 MG tablet Take 1 tablet (500 mg total) by mouth 2 (two) times daily with a meal. DO NOT CONSUME ALCOHOL WHILE TAKING THIS MEDICATION.  20 tablet  0  . mupirocin ointment (BACTROBAN) 2 % Apply topically daily.  22 g  0   No current facility-administered medications on file prior  to visit.   No Known Allergies     Review of Systems  Constitutional: Negative for fatigue and unexpected weight change.  HENT: Positive for sinus pressure.   Eyes: Negative for visual disturbance.  Respiratory: Negative for cough, chest tightness and shortness of breath.   Cardiovascular: Negative for chest pain, palpitations and leg swelling.  Gastrointestinal: Negative for abdominal pain and blood in stool.  Neurological: Positive for headaches. Negative for dizziness and light-headedness.       Objective:   Physical Exam CONSTITUTIONAL: Well developed/well nourished HEAD: Normocephalic/atraumatic. Purulent, crusty drainage on the right side of his nose. EYES: EOMI/PERRL ENMT: Mucous membranes moist NECK: supple no meningeal signs SPINE:entire spine nontender CV: S1/S2 noted, no murmurs/rubs/gallops noted LUNGS: Lungs are clear to auscultation bilaterally, no apparent distress ABDOMEN: soft, nontender, no rebound or guarding GU:no cva tenderness NEURO: Pt is awake/alert, moves all extremitiesx4 EXTREMITIES: pulses normal, full ROM SKIN: warm, color normal PSYCH: no abnormalities of mood noted   Filed Vitals:   01/26/14 0757  BP: 130/90  Pulse: 72  Temp: 98.2 F (36.8 C)  TempSrc: Oral  Resp: 16  Height: 6\' 2"  (1.88 m)  Weight: 228 lb 9.6 oz (103.692 kg)  SpO2: 98%        Assessment & Plan:   1. Hyperlipidemia   2. Hypertension   3. Routine general medical examination at a health care facility   4. Screening for prostate cancer   5. Sinusitis, chronic      Referral made to Dr. Azzie Glatter to help evaluate his sinuses. He has had chronic problems with this over the years. Other medications to remain the same. He is back to work and this is the great thing. He will continue to work on limiting his alcohol intake. He is nonsmoker. He did have a flu shot last year.

## 2014-01-29 ENCOUNTER — Telehealth: Payer: Self-pay | Admitting: Radiology

## 2014-01-29 NOTE — Telephone Encounter (Signed)
Patient called back concerning labs. Gave normal results. Patient understands

## 2014-02-02 ENCOUNTER — Telehealth: Payer: Self-pay

## 2014-02-02 NOTE — Telephone Encounter (Signed)
Pt has questions about his my chart it is stating that it is time for a colonoscopy but he states that is not true and would like to talk with someone   Work number is [phone] ext [phone] or cell number [phone]

## 2014-02-03 NOTE — Telephone Encounter (Signed)
Pt had a colonoscopy in Jan 2011. Chart updated with this info

## 2014-02-03 NOTE — Telephone Encounter (Signed)
Left message on machine to call back  

## 2014-07-27 ENCOUNTER — Encounter: Payer: Self-pay | Admitting: Emergency Medicine

## 2014-07-27 ENCOUNTER — Ambulatory Visit (INDEPENDENT_AMBULATORY_CARE_PROVIDER_SITE_OTHER): Payer: 59 | Admitting: Emergency Medicine

## 2014-07-27 ENCOUNTER — Telehealth: Payer: Self-pay | Admitting: Emergency Medicine

## 2014-07-27 VITALS — BP 135/82 | HR 75 | Temp 98.3°F | Resp 16 | Ht 73.25 in | Wt 234.2 lb

## 2014-07-27 DIAGNOSIS — E785 Hyperlipidemia, unspecified: Secondary | ICD-10-CM

## 2014-07-27 DIAGNOSIS — I10 Essential (primary) hypertension: Secondary | ICD-10-CM

## 2014-07-27 LAB — POCT URINALYSIS DIPSTICK
Bilirubin, UA: NEGATIVE
Glucose, UA: NEGATIVE
Ketones, UA: NEGATIVE
LEUKOCYTES UA: NEGATIVE
Nitrite, UA: NEGATIVE
Protein, UA: NEGATIVE
Urobilinogen, UA: 0.2
pH, UA: 5

## 2014-07-27 LAB — CBC WITH DIFFERENTIAL/PLATELET
Basophils Absolute: 0.1 10*3/uL (ref 0.0–0.1)
Basophils Relative: 1 % (ref 0–1)
Eosinophils Absolute: 0.3 10*3/uL (ref 0.0–0.7)
Eosinophils Relative: 4 % (ref 0–5)
HEMATOCRIT: 41.7 % (ref 39.0–52.0)
Hemoglobin: 14.4 g/dL (ref 13.0–17.0)
LYMPHS PCT: 14 % (ref 12–46)
Lymphs Abs: 0.9 10*3/uL (ref 0.7–4.0)
MCH: 31.1 pg (ref 26.0–34.0)
MCHC: 34.5 g/dL (ref 30.0–36.0)
MCV: 90.1 fL (ref 78.0–100.0)
MONO ABS: 0.7 10*3/uL (ref 0.1–1.0)
Monocytes Relative: 10 % (ref 3–12)
NEUTROS ABS: 4.6 10*3/uL (ref 1.7–7.7)
Neutrophils Relative %: 71 % (ref 43–77)
Platelets: 238 10*3/uL (ref 150–400)
RBC: 4.63 MIL/uL (ref 4.22–5.81)
RDW: 13.5 % (ref 11.5–15.5)
WBC: 6.5 10*3/uL (ref 4.0–10.5)

## 2014-07-27 LAB — LIPID PANEL
Cholesterol: 183 mg/dL (ref 0–200)
HDL: 65 mg/dL (ref >39)
LDL CALC: 90 mg/dL (ref 0–99)
TRIGLYCERIDES: 140 mg/dL (ref <150)
Total CHOL/HDL Ratio: 2.8 Ratio
VLDL: 28 mg/dL (ref 0–40)

## 2014-07-27 LAB — COMPLETE METABOLIC PANEL WITH GFR
ALK PHOS: 53 U/L (ref 39–117)
ALT: 30 U/L (ref 0–53)
AST: 27 U/L (ref 0–37)
Albumin: 4.3 g/dL (ref 3.5–5.2)
BUN: 12 mg/dL (ref 6–23)
CO2: 26 mEq/L (ref 19–32)
Calcium: 9.8 mg/dL (ref 8.4–10.5)
Chloride: 100 mEq/L (ref 96–112)
Creat: 1.15 mg/dL (ref 0.50–1.35)
GFR, EST NON AFRICAN AMERICAN: 70 mL/min
GFR, Est African American: 81 mL/min
GLUCOSE: 100 mg/dL — AB (ref 70–99)
Potassium: 4.4 mEq/L (ref 3.5–5.3)
Sodium: 137 mEq/L (ref 135–145)
Total Bilirubin: 0.6 mg/dL (ref 0.2–1.2)
Total Protein: 6.9 g/dL (ref 6.0–8.3)

## 2014-07-27 NOTE — Progress Notes (Addendum)
Subjective:  This chart was scribed for Arlyss Queen, MD by Donato Schultz, Medical Scribe. This patient was seen in Room 21 and the patient's care was started at 8:30 AM.   Patient ID: [name], male    DOB: 01/01/1956, 58 y.o.   MRN: [id number]  HPI HPI Comments: BAIN WHICHARD is a 58 y.o. male with a history of hypertension who presents to the [facility] for an annual physical.  He is complaining of recurring sinusitis that started a few days after his balloon sinuplasty procedure after he came in contact with his sick son.  He was referred to ENT by Dr. Everlene Farrier and had a balloon sinuplasty procedure performed on June 10, 2014.  He has been taking a sinus medication for the past 18 months with no relief to his symptoms.  He will see the ENT specialist again at the end of August.    He is also complaining of constant fatigue with associated headache and decreased concentration that he believes stems from his current sinus problems.  He denies SOB and chest pain as associated symptoms.  He states that he is unable to walk around the block without feeling winded and tired.    He is UTD on his Shingles vaccine, TDAP, and colonoscopy.    Otherwise, he states that life is good.  One of his children attends [employer] and the other attends [employer].       Past Medical History  Diagnosis Date  . Hypertension    Past Surgical History  Procedure Laterality Date  . Hernia repair     Family History  Problem Relation Age of Onset  . Hypertension Mother    History   Social History  . Marital Status: Married    Spouse Name: N/A    Number of Children: N/A  . Years of Education: N/A   Occupational History  . Not on file.   Social History Main Topics  . Smoking status: Never Smoker   . Smokeless tobacco: Not on file  . Alcohol Use: 3.5 oz/week    7 drink(s) per week  . Drug Use: No  . Sexual Activity: Yes    Partners: Female   Other Topics  Concern  . Not on file   Social History Narrative  . No narrative on file   No Known Allergies  Review of Systems  Constitutional: Positive for fatigue.  Respiratory: Negative for shortness of breath.   Cardiovascular: Negative for chest pain.  Neurological: Positive for headaches.     Objective:  Physical Exam  Nursing note and vitals reviewed. Constitutional: He is oriented to person, place, and time. He appears well-developed and well-nourished.  HENT:  Head: Normocephalic and atraumatic.  Right Ear: External ear normal.  Left Ear: External ear normal.  Mouth/Throat: Oropharynx is clear and moist. No oropharyngeal exudate.  Eyes: EOM are normal. Pupils are equal, round, and reactive to light.  Neck: Normal range of motion.  Cardiovascular: Normal rate, regular rhythm and normal heart sounds.  Exam reveals no gallop and no friction rub.   No murmur heard. Pulmonary/Chest: Effort normal and breath sounds normal. No respiratory distress. He has no wheezes. He has no rales.  Abdominal: Soft. There is no tenderness.  Musculoskeletal: Normal range of motion. He exhibits no edema.  Neurological: He is alert and oriented to person, place, and time. No cranial nerve deficit.  Skin: Skin is warm and dry.  Psychiatric: He  has a normal mood and affect. His behavior is normal.   Results for orders placed in visit on 07/27/14  POCT URINALYSIS DIPSTICK      Result Value Ref Range   Color, UA yellow     Clarity, UA clear     Glucose, UA neg     Bilirubin, UA neg     Ketones, UA neg     Spec Grav, UA <=1.005     Blood, UA trace     pH, UA 5.0     Protein, UA neg     Urobilinogen, UA 0.2     Nitrite, UA neg     Leukocytes, UA Negative       BP 135/82  Pulse 75  Temp(Src) 98.3 F (36.8 C) (Oral)  Resp 16  Ht 6' 1.25" (1.861 m)  Wt 234 lb 3.2 oz (106.232 kg)  BMI 30.67 kg/m2  SpO2 96% Assessment & Plan:  I personally performed the services described in this  documentation, which was scribed in my presence. The recorded information has been reviewed and is accurate. Major problem recently has been his persistent problems with sinus. He is followed by an ENT in [place]. He had balloon septoplasty followed by an infection which she has had difficulty recovering from . Otherwise he is due and well some stress related to finances and getting his kids off to college but otherwise he is doing well. We'll go ahead and set out for him to see a cardiologist maybe in about 4 weeks when his sinus infection is clear so he can have a stress test in evaluation because of his risk factors for heart disease with hypertension and  high cholesterol .

## 2014-07-27 NOTE — Telephone Encounter (Signed)
Please call patient labs are good

## 2014-07-28 NOTE — Telephone Encounter (Signed)
Lm labs were good call back if any questions.

## 2014-07-29 ENCOUNTER — Telehealth: Payer: Self-pay

## 2014-07-29 NOTE — Telephone Encounter (Signed)
Please contact the patient and discuss that patient the patient does need to see dr Einar Gip and that this is the way his office works and once mr Magallon understands this he can call beverly at dr Einar Gip office to schedule  For his evaluation before a stress test if dr Einar Gip feels he needs one as well as dr Everlene Farrier.

## 2014-07-29 NOTE — Telephone Encounter (Signed)
Spoke to pt, he is aware he will need to contact Dr Einar Gip (phone number and address given). Pt stated he would call and make an appointment.

## 2014-08-06 ENCOUNTER — Other Ambulatory Visit: Payer: Self-pay | Admitting: Emergency Medicine

## 2015-02-01 ENCOUNTER — Ambulatory Visit (INDEPENDENT_AMBULATORY_CARE_PROVIDER_SITE_OTHER): Payer: 59 | Admitting: Emergency Medicine

## 2015-02-01 ENCOUNTER — Encounter: Payer: Self-pay | Admitting: Emergency Medicine

## 2015-02-01 VITALS — BP 126/79 | HR 78 | Temp 98.4°F | Resp 16 | Ht 73.25 in | Wt 235.8 lb

## 2015-02-01 DIAGNOSIS — I10 Essential (primary) hypertension: Secondary | ICD-10-CM

## 2015-02-01 DIAGNOSIS — E785 Hyperlipidemia, unspecified: Secondary | ICD-10-CM

## 2015-02-01 LAB — LIPID PANEL
Cholesterol: 173 mg/dL (ref 0–200)
HDL: 60 mg/dL (ref >39)
LDL CALC: 93 mg/dL (ref 0–99)
Total CHOL/HDL Ratio: 2.9 Ratio
Triglycerides: 99 mg/dL (ref <150)
VLDL: 20 mg/dL (ref 0–40)

## 2015-02-01 LAB — COMPLETE METABOLIC PANEL WITH GFR
ALBUMIN: 4.2 g/dL (ref 3.5–5.2)
ALK PHOS: 53 U/L (ref 39–117)
ALT: 25 U/L (ref 0–53)
AST: 23 U/L (ref 0–37)
BUN: 14 mg/dL (ref 6–23)
CO2: 24 meq/L (ref 19–32)
Calcium: 9.6 mg/dL (ref 8.4–10.5)
Chloride: 101 mEq/L (ref 96–112)
Creat: 1.05 mg/dL (ref 0.50–1.35)
GFR, EST AFRICAN AMERICAN: 89 mL/min
GFR, EST NON AFRICAN AMERICAN: 77 mL/min
Glucose, Bld: 97 mg/dL (ref 70–99)
POTASSIUM: 4.5 meq/L (ref 3.5–5.3)
SODIUM: 137 meq/L (ref 135–145)
TOTAL PROTEIN: 6.8 g/dL (ref 6.0–8.3)
Total Bilirubin: 0.8 mg/dL (ref 0.2–1.2)

## 2015-02-01 MED ORDER — SIMVASTATIN 40 MG PO TABS: ORAL_TABLET | ORAL | Status: DC

## 2015-02-01 MED ORDER — LISINOPRIL 10 MG PO TABS: 10.0000 mg | ORAL_TABLET | Freq: Every day | ORAL | Status: DC

## 2015-02-01 MED ORDER — CLONIDINE HCL 0.1 MG PO TABS: 0.1000 mg | ORAL_TABLET | Freq: Two times a day (BID) | ORAL | Status: DC

## 2015-02-01 NOTE — Addendum Note (Signed)
Addended by: Yvette Rack on: 02/01/2015 09:08 AM   Modules accepted: Orders

## 2015-02-01 NOTE — Progress Notes (Signed)
   Subjective:  This chart was scribed for Darlyne Russian, MD by Ladene Artist, ED Scribe. The patient was seen in room 21. Patient's care was started at 8:10 AM.   Patient ID: Ricardo Garcia, male    DOB: 20-May-1956, 59 y.o.   MRN: [id number]  Chief Complaint  Patient presents with  . Hyperlipidemia    6 month follow up  . Hypertension   HPI HPI Comments: Ricardo Garcia is a 59 y.o. male, with a h/o HTN and hyperlipidemia, who presents to the [facility] for a 6 month follow-up. Pt reports that he has been taking his medication as prescribed and monitoring his alcohol consumption. Pt states that he started exercising x4 weekly; he went to the gym this morning. Pt expresses his desire to be taken off of medications with successful weight loss. Pt reports average BP readings of 113-119/mid 70s. BP on examination: 116/80  Sinus Pt reports persistent postnasal drip that he describes as chronic. He is scheduled for upcoming allergy testing in [place] later this month. He states that his last balloon procedure improved his symptoms but did not resolve them. He has been treating with nasal saline rinses a few times a day.   Past Medical History  Diagnosis Date  . Hypertension   . Hyperlipidemia    Current Outpatient Prescriptions on File Prior to Visit  Medication Sig Dispense Refill  . aspirin 81 MG tablet Take 160 mg by mouth daily.    . cloNIDine (CATAPRES) 0.1 MG tablet TAKE ONE TABLET BY MOUTH TWICE DAILY 180 tablet 1  . lisinopril (PRINIVIL,ZESTRIL) 10 MG tablet TAKE ONE TABLET BY MOUTH ONCE DAILY 90 tablet 1  . simvastatin (ZOCOR) 40 MG tablet TAKE ONE TABLET BY MOUTH ONCE DAILY IN THE MORNING 90 tablet 1   No current facility-administered medications on file prior to visit.   No Known Allergies   Review of Systems  Constitutional: Negative for fever.  HENT: Positive for postnasal drip.       Objective:   Physical Exam CONSTITUTIONAL: Well  developed/well nourished HEAD: Normocephalic/atraumatic EYES: EOMI/PERRL ENMT: Mucous membranes moist NECK: supple no meningeal signs SPINE/BACK:entire spine nontender CV: S1/S2 noted, no murmurs/rubs/gallops noted LUNGS: Lungs are clear to auscultation bilaterally, no apparent distress ABDOMEN: soft, nontender, no rebound or guarding, bowel sounds noted throughout abdomen GU:no cva tenderness NEURO: Pt is awake/alert/appropriate, moves all extremitiesx4.  No facial droop.   EXTREMITIES: pulses normal/equal, full ROM SKIN: warm, color normal PSYCH: no abnormalities of mood noted, alert and oriented to situation    Assessment & Plan:  Pt exercising on a  Regular basis. He is taking his medications as instructed. His alcohol intake has been the same. He is having allergy testing done in [place]. He will forward me these results. No change in medications at present. Will check a CMP and lipid panel today. Physical exam in 6 months. I personally performed the services described in this documentation, which was scribed in my presence. The recorded information has been reviewed and is accurate.

## 2015-03-03 ENCOUNTER — Ambulatory Visit (INDEPENDENT_AMBULATORY_CARE_PROVIDER_SITE_OTHER): Payer: 59 | Admitting: Family Medicine

## 2015-03-03 VITALS — BP 116/70 | HR 90 | Temp 98.6°F | Resp 16 | Ht 73.5 in | Wt 232.0 lb

## 2015-03-03 DIAGNOSIS — J324 Chronic pansinusitis: Secondary | ICD-10-CM

## 2015-03-03 DIAGNOSIS — Z9109 Other allergy status, other than to drugs and biological substances: Secondary | ICD-10-CM

## 2015-03-03 DIAGNOSIS — Z91048 Other nonmedicinal substance allergy status: Secondary | ICD-10-CM

## 2015-03-03 MED ORDER — AMOXICILLIN-POT CLAVULANATE 875-125 MG PO TABS: 1.0000 | ORAL_TABLET | Freq: Two times a day (BID) | ORAL | Status: DC

## 2015-03-03 MED ORDER — MONTELUKAST SODIUM 10 MG PO TABS: 10.0000 mg | ORAL_TABLET | Freq: Every day | ORAL | Status: DC

## 2015-03-03 MED ORDER — AZELASTINE HCL 0.15 % NA SOLN: 2.0000 | Freq: Two times a day (BID) | NASAL | Status: DC

## 2015-03-03 MED ORDER — PREDNISONE 20 MG PO TABS: 20.0000 mg | ORAL_TABLET | Freq: Every day | ORAL | Status: DC

## 2015-03-03 NOTE — Patient Instructions (Signed)
Stay on the Augmentin until your symptoms are completely resolved - likely at least 2 weeks. You can start the prednisone 20mg  daily x 5 days.  If this doesn't help, the next time you come in lets try a Depo-Medrol shot. Try taking the singular in addition to the zyrtec everynight along with the flonase every night and the azelastine nasal spray twice a day - hopefully this will be wider coverage of the allergy cascade to get better baseline symptom control especially during high pollen seasons. There is a combo product of the azelastine and flonase nasal spray if this works for you. There are also some newer allegy meds on the market - not sure they work that much better but may and likely have less side effects-  xyzal is the new improved zyrtec. If your symptoms persist, I would recommend coming back to have your white blood cell and inflammation levels checked and consider sinus xrays.. . . .    Hay Fever Hay fever is an allergic reaction to particles in the air. It cannot be passed from person to person. It cannot be cured, but it can be controlled. CAUSES  Hay fever is caused by something that triggers an allergic reaction (allergens). The following are examples of allergens:  Ragweed.  Feathers.  Animal dander.  Grass and tree pollens.  Cigarette smoke.  House dust.  Pollution. SYMPTOMS   Sneezing.  Runny or stuffy nose.  Tearing eyes.  Itchy eyes, nose, mouth, throat, skin, or other area.  Sore throat.  Headache.  Decreased sense of smell or taste. DIAGNOSIS Your caregiver will perform a physical exam and ask questions about the symptoms you are having.Allergy testing may be done to determine exactly what triggers your hay fever.  TREATMENT   Over-the-counter medicines may help symptoms. These include:  Antihistamines.  Decongestants. These may help with nasal congestion.  Your caregiver may prescribe medicines if over-the-counter medicines do not  work.  Some people benefit from allergy shots when other medicines are not helpful. HOME CARE INSTRUCTIONS   Avoid the allergen that is causing your symptoms, if possible.  Take all medicine as told by your caregiver. SEEK MEDICAL CARE IF:   You have severe allergy symptoms and your current medicines are not helping.  Your treatment was working at one time, but you are now experiencing symptoms.  You have sinus congestion and pressure.  You develop a fever or headache.  You have thick nasal discharge.  You have asthma and have a worsening cough and wheezing. SEEK IMMEDIATE MEDICAL CARE IF:   You have swelling of your tongue or lips.  You have trouble breathing.  You feel lightheaded or like you are going to faint.  You have cold sweats.  You have a fever. Document Released: 12/10/2005 Document Revised: 03/03/2012 Document Reviewed: 03/07/2011 [facility] Patient Information 2015 [place], [place]. This information is not intended to replace advice given to you by your health care provider. Make sure you discuss any questions you have with your health care provider.  Sinusitis Sinusitis is redness, soreness, and inflammation of the paranasal sinuses. Paranasal sinuses are air pockets within the bones of your face (beneath the eyes, the middle of the forehead, or above the eyes). In healthy paranasal sinuses, mucus is able to drain out, and air is able to circulate through them by way of your nose. However, when your paranasal sinuses are inflamed, mucus and air can become trapped. This can allow bacteria and other germs to grow and  cause infection. Sinusitis can develop quickly and last only a short time (acute) or continue over a long period (chronic). Sinusitis that lasts for more than 12 weeks is considered chronic.  CAUSES  Causes of sinusitis include:  Allergies.  Structural abnormalities, such as displacement of the cartilage that separates your nostrils (deviated septum),  which can decrease the air flow through your nose and sinuses and affect sinus drainage.  Functional abnormalities, such as when the small hairs (cilia) that line your sinuses and help remove mucus do not work properly or are not present. SIGNS AND SYMPTOMS  Symptoms of acute and chronic sinusitis are the same. The primary symptoms are pain and pressure around the affected sinuses. Other symptoms include:  Upper toothache.  Earache.  Headache.  Bad breath.  Decreased sense of smell and taste.  A cough, which worsens when you are lying flat.  Fatigue.  Fever.  Thick drainage from your nose, which often is green and may contain pus (purulent).  Swelling and warmth over the affected sinuses. DIAGNOSIS  Your health care provider will perform a physical exam. During the exam, your health care provider may:  Look in your nose for signs of abnormal growths in your nostrils (nasal polyps).  Tap over the affected sinus to check for signs of infection.  View the inside of your sinuses (endoscopy) using an imaging device that has a light attached (endoscope). If your health care provider suspects that you have chronic sinusitis, one or more of the following tests may be recommended:  Allergy tests.  Nasal culture. A sample of mucus is taken from your nose, sent to a lab, and screened for bacteria.  Nasal cytology. A sample of mucus is taken from your nose and examined by your health care provider to determine if your sinusitis is related to an allergy. TREATMENT  Most cases of acute sinusitis are related to a viral infection and will resolve on their own within 10 days. Sometimes medicines are prescribed to help relieve symptoms (pain medicine, decongestants, nasal steroid sprays, or saline sprays).  However, for sinusitis related to a bacterial infection, your health care provider will prescribe antibiotic medicines. These are medicines that will help kill the bacteria causing the  infection.  Rarely, sinusitis is caused by a fungal infection. In theses cases, your health care provider will prescribe antifungal medicine. For some cases of chronic sinusitis, surgery is needed. Generally, these are cases in which sinusitis recurs more than 3 times per year, despite other treatments. HOME CARE INSTRUCTIONS   Drink plenty of water. Water helps thin the mucus so your sinuses can drain more easily.  Use a humidifier.  Inhale steam 3 to 4 times a day (for example, sit in the bathroom with the shower running).  Apply a warm, moist washcloth to your face 3 to 4 times a day, or as directed by your health care provider.  Use saline nasal sprays to help moisten and clean your sinuses.  Take medicines only as directed by your health care provider.  If you were prescribed either an antibiotic or antifungal medicine, finish it all even if you start to feel better. SEEK IMMEDIATE MEDICAL CARE IF:  You have increasing pain or severe headaches.  You have nausea, vomiting, or drowsiness.  You have swelling around your face.  You have vision problems.  You have a stiff neck.  You have difficulty breathing. MAKE SURE YOU:   Understand these instructions.  Will watch your condition.  Will get  help right away if you are not doing well or get worse. Document Released: 12/10/2005 Document Revised: 04/26/2014 Document Reviewed: 12/25/2011 [facility] Patient Information 2015 [place], [place]. This information is not intended to replace advice given to you by your health care provider. Make sure you discuss any questions you have with your health care provider.

## 2015-03-03 NOTE — Progress Notes (Signed)
This chart was scribed for Ricardo Cheadle, MD by Ricardo Garcia, ED Scribe. This patient was seen in room 13 and the patient's care was started at 9:02 AM.  Subjective:    Patient ID: Ricardo Garcia, male    DOB: 1956-01-29, 59 y.o.   MRN: [id number]  Chief Complaint  Patient presents with  . Generalized Body Aches    x 3-4 days--has chronic sinusitis  . Fatigue    HPI Ricardo Garcia is a 59 y.o. male with PMhx of chronic sinusitis presents to the office with complaints of generalized body aches that have been going on for the 3-4 days now. Pt is also complaining of associated fatigue, decreased appetite, and productive cough with yellow colored sputum. He endorses some chills and sleeping 12 hours at night, which is unusual for him. He denies any ear pain, fever, abdominal pain, emesis secondary to the forceful coughing, and nausea.  He was here last month to see his PCP for refills on his unspecified chronic sinusitis. At the time he was complaining about chronic postnasal drip and was set up with an allergist in [facility]. He had a balloon procedure done secondary to him having to use nasal sprays frequently. The allergy testing was from a clinic called [facility] which lwas scanned into his chart. His balloon sinuplasty was in July 2015. He was placed on Flonase and Zyrtec daily.   Pt states that the Flonase and Zyrtec long-term have not had any effect on his chronic sinusitis, so he takes them intermittently.  Patient Active Problem List   Diagnosis Date Noted  . Unspecified sinusitis (chronic) 07/28/2013  . Hypertension 01/26/2012  . Hyperlipidemia 01/26/2012   Past Medical History  Diagnosis Date  . Hypertension   . Hyperlipidemia    Past Surgical History  Procedure Laterality Date  . Hernia repair     No Known Allergies Prior to Admission medications   Medication Sig Start Date End Date Taking? Authorizing Provider  aspirin 81 MG tablet Take 160 mg by mouth daily.     Historical Provider, MD  cloNIDine (CATAPRES) 0.1 MG tablet Take 1 tablet (0.1 mg total) by mouth 2 (two) times daily. 02/01/15   Ricardo Russian, MD  lisinopril (PRINIVIL,ZESTRIL) 10 MG tablet Take 1 tablet (10 mg total) by mouth daily. 02/01/15   Ricardo Russian, MD  simvastatin (ZOCOR) 40 MG tablet TAKE ONE TABLET BY MOUTH ONCE DAILY IN THE MORNING 02/01/15   Ricardo Russian, MD   History   Social History  . Marital Status: Divorced    Spouse Name: N/A  . Number of Children: N/A  . Years of Education: N/A   Occupational History  . Not on file.   Social History Main Topics  . Smoking status: Never Smoker   . Smokeless tobacco: Not on file  . Alcohol Use: 3.5 oz/week    7 drink(s) per week  . Drug Use: No  . Sexual Activity:    Partners: Female   Other Topics Concern  . Not on file   Social History Narrative     Review of Systems  Constitutional: Positive for chills and fatigue. Negative for fever.  HENT: Positive for congestion, postnasal drip and sinus pressure. Negative for ear pain and sore throat.   Eyes: Negative for visual disturbance.  Respiratory: Positive for cough. Negative for shortness of breath.   Cardiovascular: Negative for chest pain and leg swelling.  Gastrointestinal: Negative for nausea, vomiting, abdominal pain and  diarrhea.  Genitourinary: Negative for dysuria.  Musculoskeletal: Positive for myalgias. Negative for back pain and neck pain.  Skin: Negative for rash.  Neurological: Negative for headaches.  Hematological: Does not bruise/bleed easily.  Psychiatric/Behavioral: Negative for confusion.     BP 116/70 mmHg  Pulse 90  Temp(Src) 98.6 F (37 C)  Resp 16  Ht 6' 1.5" (1.867 m)  Wt 232 lb (105.235 kg)  BMI 30.19 kg/m2  SpO2 96%   Objective:   Physical Exam  Constitutional: He appears well-developed and well-nourished. No distress.  HENT:  Head: Normocephalic and atraumatic.  Right Ear: Tympanic membrane and external ear normal. Tympanic  membrane is not injected. No middle ear effusion.  Left Ear: Tympanic membrane and external ear normal. Tympanic membrane is not injected.  No middle ear effusion.  Nose: Mucosal edema and rhinorrhea present.  Mouth/Throat: Posterior oropharyngeal erythema present.  No significant mucosal edema but with purulent rhinorrhea with friability bilaterally worse at the septum.   Eyes: Conjunctivae and EOM are normal. Pupils are equal, round, and reactive to light. Right eye exhibits no discharge. Left eye exhibits no discharge.  Neck: Normal range of motion. Neck supple. No thyromegaly present.  Cardiovascular: Normal rate, regular rhythm and normal heart sounds.  Exam reveals no gallop and no friction rub.   No murmur heard. Pulmonary/Chest: Effort normal and breath sounds normal. No respiratory distress. He has no wheezes. He has no rales.  Abdominal: Soft. He exhibits no distension. There is no tenderness.  Musculoskeletal: He exhibits no edema or tenderness.  Lymphadenopathy:    He has no cervical adenopathy.  Neurological: He is alert.  Skin: Skin is warm and dry.  Psychiatric: He has a normal mood and affect. His behavior is normal. Thought content normal.  Nursing note and vitals reviewed.  Assessment & Plan:   Discussed with pt he may respond better to IM DepoMedrol that oral prednisone. He does not remember having tried singular or azelastine for chronic allergies so will try. If he doesnt respond to this he may want to consider Xyzal. Pt does okay on low dose of prednisone but higher than 30mg  side effect are intolerable so will try prednisone 20 mg for 5 days, if course is prolonged to much longer he may notice recurrent symptoms. Also recommend longer antibiotic course to make sure symptoms are completely resolved before stopping. Will try continued Augmentin for 2 weeks. Chronic pansinusitis  Environmental allergies  Meds ordered this encounter  Medications  .  amoxicillin-clavulanate (AUGMENTIN) 875-125 MG per tablet    Sig: Take 1 tablet by mouth 2 (two) times daily.    Dispense:  40 tablet    Refill:  0  . predniSONE (DELTASONE) 20 MG tablet    Sig: Take 1 tablet (20 mg total) by mouth daily with breakfast.    Dispense:  5 tablet    Refill:  2  . montelukast (SINGULAIR) 10 MG tablet    Sig: Take 1 tablet (10 mg total) by mouth at bedtime.    Dispense:  30 tablet    Refill:  5  . Azelastine HCl 0.15 % SOLN    Sig: Place 2 sprays into the nose 2 (two) times daily.    Dispense:  30 mL    Refill:  1    I personally performed the services described in this documentation, which was scribed in my presence. The recorded information has been reviewed and considered, and addended by me as needed.  Ricardo Cheadle, MD MPH

## 2015-05-28 ENCOUNTER — Other Ambulatory Visit: Payer: Self-pay | Admitting: Emergency Medicine

## 2015-05-30 NOTE — Telephone Encounter (Signed)
Pt called to check on req bc he stated he had asked pharm for RF last Tues. We did not receive req until Sat. Pt stated he uses this cream for his eczema and doesn't need it very often. Dr Everlene Farrier, you saw pt in Feb for check up but haven't had to address eczema in a couple of years. OK to give RFs?

## 2015-05-31 NOTE — Telephone Encounter (Signed)
Notified pt that RF had been sent. Pt had p/up yesterday when sent and reported that his ins has changed and they are not covering it for as low a co-pay and in smaller quantity. He will call [facility] and see if there is a better option and will call back to see if Dr Everlene Farrier can change Rx, if so.

## 2015-07-11 ENCOUNTER — Other Ambulatory Visit: Payer: Self-pay | Admitting: Podiatry

## 2015-07-11 DIAGNOSIS — M25571 Pain in right ankle and joints of right foot: Secondary | ICD-10-CM

## 2015-07-14 ENCOUNTER — Ambulatory Visit
Admission: RE | Admit: 2015-07-14 | Discharge: 2015-07-14 | Disposition: A | Discharge status: Home / Self Care | Payer: 59 | Source: Ambulatory Visit | Attending: Podiatry | Admitting: Podiatry

## 2015-07-14 DIAGNOSIS — M25571 Pain in right ankle and joints of right foot: Secondary | ICD-10-CM

## 2015-07-16 ENCOUNTER — Other Ambulatory Visit: Payer: Self-pay

## 2015-07-21 ENCOUNTER — Other Ambulatory Visit: Payer: Self-pay | Admitting: Emergency Medicine

## 2015-07-26 ENCOUNTER — Ambulatory Visit (INDEPENDENT_AMBULATORY_CARE_PROVIDER_SITE_OTHER): Payer: 59 | Admitting: Emergency Medicine

## 2015-07-26 ENCOUNTER — Encounter: Payer: Self-pay | Admitting: Emergency Medicine

## 2015-07-26 VITALS — BP 130/74 | HR 70 | Temp 97.1°F | Resp 16 | Ht 74.0 in | Wt 235.0 lb

## 2015-07-26 DIAGNOSIS — S99911S Unspecified injury of right ankle, sequela: Secondary | ICD-10-CM

## 2015-07-26 DIAGNOSIS — E785 Hyperlipidemia, unspecified: Secondary | ICD-10-CM

## 2015-07-26 DIAGNOSIS — J324 Chronic pansinusitis: Secondary | ICD-10-CM

## 2015-07-26 DIAGNOSIS — I10 Essential (primary) hypertension: Secondary | ICD-10-CM

## 2015-07-26 DIAGNOSIS — L409 Psoriasis, unspecified: Secondary | ICD-10-CM

## 2015-07-26 DIAGNOSIS — Z0000 Encounter for general adult medical examination without abnormal findings: Secondary | ICD-10-CM

## 2015-07-26 LAB — CBC WITH DIFFERENTIAL/PLATELET
Basophils Absolute: 0.1 10*3/uL (ref 0.0–0.1)
Basophils Relative: 1 % (ref 0–1)
EOS PCT: 2 % (ref 0–5)
Eosinophils Absolute: 0.1 10*3/uL (ref 0.0–0.7)
HEMATOCRIT: 41 % (ref 39.0–52.0)
HEMOGLOBIN: 13.9 g/dL (ref 13.0–17.0)
Lymphocytes Relative: 13 % (ref 12–46)
Lymphs Abs: 0.9 10*3/uL (ref 0.7–4.0)
MCH: 30.1 pg (ref 26.0–34.0)
MCHC: 33.9 g/dL (ref 30.0–36.0)
MCV: 88.7 fL (ref 78.0–100.0)
MONO ABS: 0.8 10*3/uL (ref 0.1–1.0)
MPV: 10.1 fL (ref 8.6–12.4)
Monocytes Relative: 11 % (ref 3–12)
Neutro Abs: 5.1 10*3/uL (ref 1.7–7.7)
Neutrophils Relative %: 73 % (ref 43–77)
Platelets: 230 10*3/uL (ref 150–400)
RBC: 4.62 MIL/uL (ref 4.22–5.81)
RDW: 13.2 % (ref 11.5–15.5)
WBC: 7 10*3/uL (ref 4.0–10.5)

## 2015-07-26 LAB — POCT URINALYSIS DIPSTICK
BILIRUBIN UA: NEGATIVE
Blood, UA: NEGATIVE
Glucose, UA: NEGATIVE
KETONES UA: NEGATIVE
Leukocytes, UA: NEGATIVE
Nitrite, UA: NEGATIVE
Protein, UA: NEGATIVE
Spec Grav, UA: 1.015
Urobilinogen, UA: 0.2
pH, UA: 5.5

## 2015-07-26 LAB — POCT UA - MICROSCOPIC ONLY
Bacteria, U Microscopic: NEGATIVE
CRYSTALS, UR, HPF, POC: NEGATIVE
Casts, Ur, LPF, POC: NEGATIVE
Epithelial cells, urine per micros: NEGATIVE
Mucus, UA: NEGATIVE
WBC, Ur, HPF, POC: NEGATIVE
YEAST UA: NEGATIVE

## 2015-07-26 LAB — LIPID PANEL
CHOL/HDL RATIO: 3.1 ratio (ref <=5.0)
CHOLESTEROL: 175 mg/dL (ref 125–200)
HDL: 56 mg/dL (ref >=40)
LDL Cholesterol: 102 mg/dL (ref <130)
TRIGLYCERIDES: 85 mg/dL (ref <150)
VLDL: 17 mg/dL (ref <30)

## 2015-07-26 LAB — COMPLETE METABOLIC PANEL WITH GFR
ALK PHOS: 56 U/L (ref 40–115)
ALT: 23 U/L (ref 9–46)
AST: 21 U/L (ref 10–35)
Albumin: 4 g/dL (ref 3.6–5.1)
BILIRUBIN TOTAL: 0.6 mg/dL (ref 0.2–1.2)
BUN: 12 mg/dL (ref 7–25)
CHLORIDE: 101 mmol/L (ref 98–110)
CO2: 26 mmol/L (ref 20–31)
Calcium: 9.6 mg/dL (ref 8.6–10.3)
Creat: 1.11 mg/dL (ref 0.70–1.33)
GFR, EST AFRICAN AMERICAN: 84 mL/min (ref >=60)
GFR, Est Non African American: 72 mL/min (ref >=60)
GLUCOSE: 113 mg/dL — AB (ref 65–99)
POTASSIUM: 4.5 mmol/L (ref 3.5–5.3)
SODIUM: 137 mmol/L (ref 135–146)
TOTAL PROTEIN: 6.7 g/dL (ref 6.1–8.1)

## 2015-07-26 NOTE — Progress Notes (Addendum)
Patient ID: Ricardo Garcia, male   DOB: 07-24-1956, 59 y.o.   MRN: [id number]    This chart was scribed for Nena Jordan, MD by [facility], medical scribe at [facility].The patient was seen in exam room 22 and the patient's care was started at 8:40 AM.  Chief Complaint:  Chief Complaint  Patient presents with  . Annual Exam    already had ekg, stress test within the past year   HPI: Ricardo Garcia is a 59 y.o. male who reports to [facility] today for a complete physical exam.   Recurrent right ankle pain since childhood injuries. Initial flare up while standing up in the locker. Then while working at his home on a ladder he re injured his ankle. MRI showed a tear at the right medial deltoid ligament.Date of injury was 5 weeks ago.  Referred to [facility]. Also complains of persistent sinus allergies. Followed by an allergist in [place]. Drinking two glasses of Wine a night. Living situation is good. UTD on colonoscopy.  Past Medical History  Diagnosis Date  . Hypertension   . Hyperlipidemia    Past Surgical History  Procedure Laterality Date  . Hernia repair     History   Social History  . Marital Status: Divorced    Spouse Name: N/A  . Number of Children: N/A  . Years of Education: N/A   Social History Main Topics  . Smoking status: Never Smoker   . Smokeless tobacco: Not on file  . Alcohol Use: 3.5 oz/week    7 Standard drinks or equivalent per week  . Drug Use: No  . Sexual Activity:    Partners: Female   Other Topics Concern  . None   Social History Narrative   Family History  Problem Relation Age of Onset  . Hypertension Mother   . Stroke Mother    No Known Allergies Prior to Admission medications   Medication Sig Start Date End Date Taking? Authorizing Provider  aspirin 81 MG tablet Take 160 mg by mouth daily.   Yes Historical Provider, MD  cloNIDine (CATAPRES) 0.1 MG tablet TAKE ONE TABLET BY MOUTH TWICE DAILY 07/22/15  Yes  Darlyne Russian, MD  halobetasol (ULTRAVATE) 0.05 % cream APPLY TOPICALLY 2 (TWO) TIMES DAILY. 05/30/15  Yes Darlyne Russian, MD  lisinopril (PRINIVIL,ZESTRIL) 10 MG tablet TAKE ONE TABLET BY MOUTH ONCE DAILY 07/22/15  Yes Darlyne Russian, MD  montelukast (SINGULAIR) 10 MG tablet Take 1 tablet (10 mg total) by mouth at bedtime. 03/03/15  Yes Shawnee Knapp, MD  simvastatin (ZOCOR) 40 MG tablet TAKE ONE TABLET BY MOUTH ONCE DAILY IN THE MORNING 02/01/15  Yes Darlyne Russian, MD     ROS: The patient denies fevers, chills, night sweats, unintentional weight loss, chest pain, palpitations, wheezing, dyspnea on exertion, nausea, vomiting, abdominal pain, dysuria, hematuria, melena, numbness, weakness, or tingling.  All other systems have been reviewed and were otherwise negative with the exception of those mentioned in the HPI and as above.    PHYSICAL EXAM: Filed Vitals:   07/26/15 0818  BP: 130/74  Pulse: 70  Temp: 97.1 F (36.2 C)  Resp: 16   Body mass index is 30.16 kg/(m^2).  General: Alert, no acute distress HEENT:  Normocephalic, atraumatic, oropharynx patent. Eye: Juliet[facility] [address] [facility] Cardiovascular:  Regular rate and rhythm, no rubs murmurs or gallops.  No Carotid bruits, radial pulse intact. No pedal edema.  Respiratory: Clear to auscultation bilaterally.  No wheezes, rales, or rhonchi.  No cyanosis, no use of accessory musculature Abdominal: No organomegaly, abdomen is soft and non-tender, positive bowel sounds.  No masses. Musculoskeletal: Gait intact. No edema, tenderness. Significant swelling over the right ankle with very limited ROM. 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 Psychiatric: Patient acts appropriately throughout our interaction. Lymphatic: No cervical or submandibular lymphadenopathy  LABS: Results for orders placed or performed in visit on 02/01/15  COMPLETE METABOLIC PANEL WITH GFR  Result Value Ref Range   Sodium  137 135 - 145 mEq/L   Potassium 4.5 3.5 - 5.3 mEq/L   Chloride 101 96 - 112 mEq/L   CO2 24 19 - 32 mEq/L   Glucose, Bld 97 70 - 99 mg/dL   BUN 14 6 - 23 mg/dL   Creat 1.05 0.50 - 1.35 mg/dL   Total Bilirubin 0.8 0.2 - 1.2 mg/dL   Alkaline Phosphatase 53 39 - 117 U/L   AST 23 0 - 37 U/L   ALT 25 0 - 53 U/L   Total Protein 6.8 6.0 - 8.3 g/dL   Albumin 4.2 3.5 - 5.2 g/dL   Calcium 9.6 8.4 - 10.5 mg/dL   GFR, Est African American 89 mL/min   GFR, Est Non African American 77 mL/min  Lipid panel  Result Value Ref Range   Cholesterol 173 0 - 200 mg/dL   Triglycerides 99 <150 mg/dL   HDL 60 >39 mg/dL   Total CHOL/HDL Ratio 2.9 Ratio   VLDL 20 0 - 40 mg/dL   LDL Cholesterol 93 0 - 99 mg/dL   ASSESSMENT/PLAN: Patient doing well. He will continue nonweightbearing exercises due to his right ankle injury. No change in medications at present. He was again encouraged to decrease alcohol intake. He had cardiovascular evaluation last year so no EKG was done today.I personally performed the services described in this documentation, which was scribed in my presence. The recorded information has been reviewed and is accurate.  Nena Jordan, MD  Gross sideeffects, risk and benefits, and alternatives of medications d/w patient. Patient is aware that all medications have potential sideeffects and we are unable to predict every sideeffect or drug-drug interaction that may occur.    Arlyss Queen MD 07/26/2015 8:30 AM

## 2015-07-27 ENCOUNTER — Encounter: Payer: Self-pay | Admitting: Family Medicine

## 2015-07-27 LAB — PSA: PSA: 0.71 ng/mL (ref <=4.00)

## 2015-08-14 ENCOUNTER — Other Ambulatory Visit: Payer: Self-pay | Admitting: Emergency Medicine

## 2015-09-27 ENCOUNTER — Encounter: Payer: Self-pay | Admitting: Emergency Medicine

## 2015-10-01 ENCOUNTER — Ambulatory Visit (INDEPENDENT_AMBULATORY_CARE_PROVIDER_SITE_OTHER): Payer: 59 | Admitting: Family Medicine

## 2015-10-01 VITALS — BP 142/74 | HR 75 | Temp 98.5°F | Resp 16 | Ht 75.0 in | Wt 236.0 lb

## 2015-10-01 DIAGNOSIS — Z23 Encounter for immunization: Secondary | ICD-10-CM

## 2015-10-01 NOTE — Progress Notes (Signed)
   Subjective:    Patient ID: Ricardo Garcia, male    DOB: 1956/05/19, 59 y.o.   MRN: [id number]  HPI    Review of Systems     Objective:   Physical Exam        Assessment & Plan:

## 2015-11-01 ENCOUNTER — Ambulatory Visit (INDEPENDENT_AMBULATORY_CARE_PROVIDER_SITE_OTHER): Payer: 59 | Admitting: Family Medicine

## 2015-11-01 ENCOUNTER — Other Ambulatory Visit: Payer: Self-pay | Admitting: Orthopaedic Surgery

## 2015-11-01 VITALS — BP 128/72 | HR 85 | Temp 98.8°F | Resp 16 | Ht 74.0 in | Wt 237.8 lb

## 2015-11-01 DIAGNOSIS — J0191 Acute recurrent sinusitis, unspecified: Secondary | ICD-10-CM

## 2015-11-01 MED ORDER — AMOXICILLIN-POT CLAVULANATE 875-125 MG PO TABS: 1.0000 | ORAL_TABLET | Freq: Two times a day (BID) | ORAL | Status: DC

## 2015-11-01 NOTE — Progress Notes (Signed)
Subjective:  This chart was scribed for Ricardo Garcia, by Ricardo Garcia, at [facility].  This patient was seen in room 2 and the patient's care was started at 9:31 AM.    Patient ID: Ricardo Garcia, male    DOB: [birth year], 59 y.o.   MRN: [id number] Chief Complaint  Patient presents with  . Headache    x 2 months  . Sinusitis    HPI  HPI Comments: Ricardo Garcia is a 59 y.o. male who presents to the [facility] complaining of intermittent sinus pressure/pain onset one month ago with associated symptoms of a headache, drainage, as well as fatigue.  Patient states that these symptoms occur frequently for him as the weather changes regardless of the many things he has tried in the past.  He states that he felt miserable two days ago and felt "great" three days ago.   He has been using a nasal salt water rinse as well as steam which he always does when his symptoms begin. He has been using allergy medication (three drops daily under the tongue- not yet FDA approved).  States that Flonase does not help him nor does Singulair (which he has tried plenty of times in the past).  Denies fevers/chills. He has used prednisone in the past but didn't feel like it helped (and makes him very fidgety).  The last time he was on antibiotics (Augmentin)  for similar symptoms ,  took 14 days to clear up the congestion but he was very happy with how effective it was. Patient sells EHR.  Patient has an ENT (Dr. Azzie Glatter in [facility]- sees every 6-8 weeks.  Patient has had sinus surgery (June) and uses allergy drops.  He states that the sinus surgery has helped him but he still has symptoms.   He has been fighting these re-occuring symptoms for the past 10 years.       Patient Active Problem List   Diagnosis Date Noted  . Unspecified sinusitis (chronic) 07/28/2013  . Hypertension 01/26/2012  . Hyperlipidemia 01/26/2012   Past Medical History  Diagnosis Date    . Hypertension   . Hyperlipidemia    Past Surgical History  Procedure Laterality Date  . Hernia repair     No Known Allergies Prior to Admission medications   Medication Sig Start Date End Date Taking? Authorizing Provider  aspirin 81 MG tablet Take 160 mg by mouth daily.   Yes Historical Provider, Garcia  cloNIDine (CATAPRES) 0.1 MG tablet TAKE ONE TABLET BY MOUTH TWICE DAILY. 08/15/15  Yes Chelle Jeffery, PA-C  halobetasol (ULTRAVATE) 0.05 % cream APPLY TOPICALLY 2 (TWO) TIMES DAILY. 05/30/15  Yes Darlyne Russian, Garcia  lisinopril (PRINIVIL,ZESTRIL) 10 MG tablet TAKE ONE TABLET BY MOUTH ONCE DAILY 08/15/15  Yes Chelle Jeffery, PA-C  montelukast (SINGULAIR) 10 MG tablet Take 1 tablet (10 mg total) by mouth at bedtime. 03/03/15  Yes Shawnee Knapp, Garcia  simvastatin (ZOCOR) 40 MG tablet TAKE ONE TABLET BY MOUTH ONCE DAILY IN THE MORNING 08/15/15  Yes Harrison Mons, PA-C   Social History   Social History  . Marital Status: Divorced    Spouse Name: N/A  . Number of Children: N/A  . Years of Education: N/A   Occupational History  . Not on file.   Social History Main Topics  . Smoking status: Never Smoker   . Smokeless tobacco: Never Used  . Alcohol Use: 4.2 oz/week  7 Standard drinks or equivalent per week  . Drug Use: No  . Sexual Activity:    Partners: Female   Other Topics Concern  . Not on file   Social History Narrative        Review of Systems  Constitutional: Positive for fatigue. Negative for fever and chills.  HENT: Positive for congestion, postnasal drip and sinus pressure. Negative for nosebleeds.   Eyes: Negative for pain and redness.  Respiratory: Negative for cough, choking and shortness of breath.   Gastrointestinal: Negative for nausea and vomiting.  Neurological: Positive for headaches. Negative for syncope and speech difficulty.       Objective:   Physical Exam  Constitutional: He is oriented to person, place, and time. He appears well-developed and  well-nourished.  HENT:  Head: Normocephalic and atraumatic.  Right Ear: Tympanic membrane, external ear and ear canal normal.  Left Ear: Tympanic membrane, external ear and ear canal normal.  Nose: No rhinorrhea.  Mouth/Throat: Oropharynx is clear and moist and mucous membranes are normal. No oropharyngeal exudate or posterior oropharyngeal erythema.  Eyes: Conjunctivae are normal. Pupils are equal, round, and reactive to light.  Neck: Neck supple.  Cardiovascular: Normal rate, regular rhythm, normal heart sounds and intact distal pulses.   No murmur heard. Pulmonary/Chest: Effort normal and breath sounds normal. He has no wheezes. He has no rhonchi. He has no rales.  Abdominal: Soft. There is no tenderness.  Lymphadenopathy:    He has no cervical adenopathy.  Neurological: He is alert and oriented to person, place, and time.  Skin: Skin is warm and dry. No rash noted.  Psychiatric: He has a normal mood and affect. His behavior is normal.  Vitals reviewed.  Filed Vitals:   11/01/15 0811  BP: 128/72  Pulse: 85  Temp: 98.8 F (37.1 C)  TempSrc: Oral  Resp: 16  Height: 6\' 2"  (1.88 m)  Weight: 237 lb 12.8 oz (107.865 kg)  SpO2: 98%          Assessment & Plan:   Ricardo Garcia is a 59 y.o. male Acute recurrent sinusitis, unspecified location - Plan: amoxicillin-clavulanate (AUGMENTIN) 875-125 MG tablet  - trial of Augmentin BID for 2 weeks as improved last episode. Deferred prednisone as did not appear to help last time per pt and experienced side effects.    -cont saline ns/rinses.   -trial of allegra as on allergy drops from ENT, so allergic component.   -if persistent after 2 weeks course Augmentin - recommended discussing next step with ENT.   -RTC precautions.    Meds ordered this encounter  Medications  . amoxicillin-clavulanate (AUGMENTIN) 875-125 MG tablet    Sig: Take 1 tablet by mouth 2 (two) times daily.    Dispense:  28 tablet    Refill:  0   Patient  Instructions  Start augmentin - 2 week course. Can try allegra if possible allergy component as well. Continue saline wash/spray.  If not improving with course of Augmentin - recommend discussing next step with your ENT provider. Return to the clinic or go to the nearest emergency room if any of your symptoms worsen or new symptoms occur.  Sinusitis, Adult Sinusitis is redness, soreness, and inflammation of the paranasal sinuses. Paranasal sinuses are air pockets within the bones of your face. They are located beneath your eyes, in the middle of your forehead, and above your eyes. In healthy paranasal sinuses, mucus is able to drain out, and air is able to circulate through  them by way of your nose. However, when your paranasal sinuses are inflamed, mucus and air can become trapped. This can allow bacteria and other germs to grow and cause infection. Sinusitis can develop quickly and last only a short time (acute) or continue over a long period (chronic). Sinusitis that lasts for more than 12 weeks is considered chronic. CAUSES Causes of sinusitis include:  Allergies.  Structural abnormalities, such as displacement of the cartilage that separates your nostrils (deviated septum), which can decrease the air flow through your nose and sinuses and affect sinus drainage.  Functional abnormalities, such as when the small hairs (cilia) that line your sinuses and help remove mucus do not work properly or are not present. SIGNS AND SYMPTOMS Symptoms of acute and chronic sinusitis are the same. The primary symptoms are pain and pressure around the affected sinuses. Other symptoms include:  Upper toothache.  Earache.  Headache.  Bad breath.  Decreased sense of smell and taste.  A cough, which worsens when you are lying flat.  Fatigue.  Fever.  Thick drainage from your nose, which often is green and may contain pus (purulent).  Swelling and warmth over the affected sinuses. DIAGNOSIS Your  health care provider will perform a physical exam. During your exam, your health care provider may perform any of the following to help determine if you have acute sinusitis or chronic sinusitis:  Look in your nose for signs of abnormal growths in your nostrils (nasal polyps).  Tap over the affected sinus to check for signs of infection.  View the inside of your sinuses using an imaging device that has a light attached (endoscope). If your health care provider suspects that you have chronic sinusitis, one or more of the following tests may be recommended:  Allergy tests.  Nasal culture. A sample of mucus is taken from your nose, sent to a lab, and screened for bacteria.  Nasal cytology. A sample of mucus is taken from your nose and examined by your health care provider to determine if your sinusitis is related to an allergy. TREATMENT Most cases of acute sinusitis are related to a viral infection and will resolve on their own within 10 days. Sometimes, medicines are prescribed to help relieve symptoms of both acute and chronic sinusitis. These may include pain medicines, decongestants, nasal steroid sprays, or saline sprays. However, for sinusitis related to a bacterial infection, your health care provider will prescribe antibiotic medicines. These are medicines that will help kill the bacteria causing the infection. Rarely, sinusitis is caused by a fungal infection. In these cases, your health care provider will prescribe antifungal medicine. For some cases of chronic sinusitis, surgery is needed. Generally, these are cases in which sinusitis recurs more than 3 times per year, despite other treatments. HOME CARE INSTRUCTIONS  Drink plenty of water. Water helps thin the mucus so your sinuses can drain more easily.  Use a humidifier.  Inhale steam 3-4 times a day (for example, sit in the bathroom with the shower running).  Apply a warm, moist washcloth to your face 3-4 times a day, or as  directed by your health care provider.  Use saline nasal sprays to help moisten and clean your sinuses.  Take medicines only as directed by your health care provider.  If you were prescribed either an antibiotic or antifungal medicine, finish it all even if you start to feel better. SEEK IMMEDIATE MEDICAL CARE IF:  You have increasing pain or severe headaches.  You have nausea, vomiting,  or drowsiness.  You have swelling around your face.  You have vision problems.  You have a stiff neck.  You have difficulty breathing.   This information is not intended to replace advice given to you by your health care provider. Make sure you discuss any questions you have with your health care provider.   Document Released: 12/10/2005 Document Revised: 12/31/2014 Document Reviewed: 12/25/2011 Elsevier Interactive Patient Education [employer].

## 2015-11-01 NOTE — Patient Instructions (Signed)
Start augmentin - 2 week course. Can try allegra if possible allergy component as well. Continue saline wash/spray.  If not improving with course of Augmentin - recommend discussing next step with your ENT provider. Return to the clinic or go to the nearest emergency room if any of your symptoms worsen or new symptoms occur.  Sinusitis, Adult Sinusitis is redness, soreness, and inflammation of the paranasal sinuses. Paranasal sinuses are air pockets within the bones of your face. They are located beneath your eyes, in the middle of your forehead, and above your eyes. In healthy paranasal sinuses, mucus is able to drain out, and air is able to circulate through them by way of your nose. However, when your paranasal sinuses are inflamed, mucus and air can become trapped. This can allow bacteria and other germs to grow and cause infection. Sinusitis can develop quickly and last only a short time (acute) or continue over a long period (chronic). Sinusitis that lasts for more than 12 weeks is considered chronic. CAUSES Causes of sinusitis include:  Allergies.  Structural abnormalities, such as displacement of the cartilage that separates your nostrils (deviated septum), which can decrease the air flow through your nose and sinuses and affect sinus drainage.  Functional abnormalities, such as when the small hairs (cilia) that line your sinuses and help remove mucus do not work properly or are not present. SIGNS AND SYMPTOMS Symptoms of acute and chronic sinusitis are the same. The primary symptoms are pain and pressure around the affected sinuses. Other symptoms include:  Upper toothache.  Earache.  Headache.  Bad breath.  Decreased sense of smell and taste.  A cough, which worsens when you are lying flat.  Fatigue.  Fever.  Thick drainage from your nose, which often is green and may contain pus (purulent).  Swelling and warmth over the affected sinuses. DIAGNOSIS Your health care  provider will perform a physical exam. During your exam, your health care provider may perform any of the following to help determine if you have acute sinusitis or chronic sinusitis:  Look in your nose for signs of abnormal growths in your nostrils (nasal polyps).  Tap over the affected sinus to check for signs of infection.  View the inside of your sinuses using an imaging device that has a light attached (endoscope). If your health care provider suspects that you have chronic sinusitis, one or more of the following tests may be recommended:  Allergy tests.  Nasal culture. A sample of mucus is taken from your nose, sent to a lab, and screened for bacteria.  Nasal cytology. A sample of mucus is taken from your nose and examined by your health care provider to determine if your sinusitis is related to an allergy. TREATMENT Most cases of acute sinusitis are related to a viral infection and will resolve on their own within 10 days. Sometimes, medicines are prescribed to help relieve symptoms of both acute and chronic sinusitis. These may include pain medicines, decongestants, nasal steroid sprays, or saline sprays. However, for sinusitis related to a bacterial infection, your health care provider will prescribe antibiotic medicines. These are medicines that will help kill the bacteria causing the infection. Rarely, sinusitis is caused by a fungal infection. In these cases, your health care provider will prescribe antifungal medicine. For some cases of chronic sinusitis, surgery is needed. Generally, these are cases in which sinusitis recurs more than 3 times per year, despite other treatments. HOME CARE INSTRUCTIONS  Drink plenty of water. Water helps thin the  mucus so your sinuses can drain more easily.  Use a humidifier.  Inhale steam 3-4 times a day (for example, sit in the bathroom with the shower running).  Apply a warm, moist washcloth to your face 3-4 times a day, or as directed by  your health care provider.  Use saline nasal sprays to help moisten and clean your sinuses.  Take medicines only as directed by your health care provider.  If you were prescribed either an antibiotic or antifungal medicine, finish it all even if you start to feel better. SEEK IMMEDIATE MEDICAL CARE IF:  You have increasing pain or severe headaches.  You have nausea, vomiting, or drowsiness.  You have swelling around your face.  You have vision problems.  You have a stiff neck.  You have difficulty breathing.   This information is not intended to replace advice given to you by your health care provider. Make sure you discuss any questions you have with your health care provider.   Document Released: 12/10/2005 Document Revised: 12/31/2014 Document Reviewed: 12/25/2011 Elsevier Interactive Patient Education [employer].

## 2015-11-09 NOTE — H&P (Signed)
Ricardo Garcia is an 59 y.o. male.   Chief Complaint: Right ankle pain HPI: Ricardo Garcia continues with some ankle pain.  He says he feels as though something moves around.  The injection we gave him did help a little bit but he thinks it's basically rest which is making him somewhat better.  He has days when he is fine but then he will feel something move and things are bad again.  This will cause a mechanical catch in the front of his ankle.  He localizes his pain straight midline anterior.  The pain is intermittent and severe.  He wonders what we can do to affect a change.  His ankle does not feel unstable and does not twist easily.  He wears a brace rarely at this point.   Radiographs:  X-rays that were ordered, performed, and interpreted by me today included a lateral view of the right ankle with a BB over the prominent area.  This shows a prominence of the anterior talus at the talonavicular joint along with a loose fragment of bone.  Past Medical History  Diagnosis Date  . Hypertension   . Hyperlipidemia     Past Surgical History  Procedure Laterality Date  . Hernia repair      Family History  Problem Relation Age of Onset  . Hypertension Mother   . Stroke Mother    Social History:  reports that he has never smoked. He has never used smokeless tobacco. He reports that he drinks about 4.2 oz of alcohol per week. He reports that he does not use illicit drugs.  Allergies: No Known Allergies  No prescriptions prior to admission    No results found for this or any previous visit (from the past 48 hour(s)). No results found.  Review of Systems  Musculoskeletal: Positive for joint pain.       Right ankle  All other systems reviewed and are negative.   There were no vitals taken for this visit. Physical Exam  Constitutional: He is oriented to person, place, and time. He appears well-developed and well-nourished.  HENT:  Head: Normocephalic.  Eyes: Pupils are equal, round, and reactive  to light.  Neck: Normal range of motion.  Cardiovascular: Normal rate and regular rhythm.   Respiratory: Effort normal.  GI: Soft.  Musculoskeletal:  Right ankle motion is a little limited.  He has pain over a palpable firm area just anterior to the joint.  I do not get much anterolateral pain today.  There is no medial pain.  There is no apprehension to inversion.  Sensation and motor function are intact with a palpable pulse.  His arches are average and well-maintained.  There is no palpable lymphadenopathy behind his knee.    Neurological: He is alert and oriented to person, place, and time.  Skin: Skin is warm and dry.  Psychiatric: He has a normal mood and affect. His behavior is normal. Judgment and thought content normal.     Assessment/Plan Assessment: Right ankle pain injected 07/27/15  Plan: Ricardo Garcia has some pain at his ankle directly over a palpable large osteophyte and loose body.  He wonders what can be done to affect a change at this point.  This has bothered him for quite some time despite the cam boot, injection, bracing, and therapy.  I think a good option would be a small incision with removal of the loose piece of bone and associated osteophyte under fluoroscopic guidance.  I reviewed risk of anesthesia and infection.  I think we can forego an arthroscopy.  He would have to concentrate on elevation of his foot for about a week postoperatively to minimize the chance of a complication.  Emmilia Sowder, Larwance Sachs 11/09/2015, 12:16 PM

## 2015-11-11 NOTE — Pre-Procedure Instructions (Signed)
    Orlando Penner Stough  11/11/2015      [id number] - [place], [place] - [address] [address] Phone: [phone] Fax: [phone]  [facility] [place], [place] [place] [address] Phone: [phone] Fax: [phone]  [facility] [place], [place] [address] Phone: [phone] Fax: [phone]    Your procedure is scheduled on 11/15/15.  Report to [facility] Ad[facility] at [place] [facility]  Call this number if you have problems the morning of surgery:  [phone]   Remember:  Do not eat food or drink liquids after midnight.  Take these medicines the morning of surgery with A SIP OF WATER --clonidine,augmentin   Do not wear jewelry, make-up or nail polish.  Do not wear lotions, powders, or perfumes.  You may wear deodorant.  Do not shave 48 hours prior to surgery.  Men may shave face and neck.  Do not bring valuables to the hospital.  [facility] is not responsible for any belongings or valuables.  Contacts, dentures or bridgework may not be worn into surgery.  Leave your suitcase in the car.  After surgery it may be brought to your room.  For patients admitted to the hospital, discharge time will be determined by your treatment team.  Patients discharged the day of surgery will not be allowed to drive home.   Name and phone number of your driver:    Special instructions:   Please read over the following fact sheets that you were given. Pain Booklet, Coughing and Deep Breathing and Surgical Site Infection Prevention

## 2015-11-14 ENCOUNTER — Encounter (HOSPITAL_COMMUNITY): Payer: Self-pay

## 2015-11-14 ENCOUNTER — Encounter (HOSPITAL_COMMUNITY)
Admission: RE | Admit: 2015-11-14 | Discharge: 2015-11-14 | Disposition: A | Discharge status: Home / Self Care | Payer: 59 | Source: Ambulatory Visit | Attending: Orthopaedic Surgery | Admitting: Orthopaedic Surgery

## 2015-11-14 DIAGNOSIS — E785 Hyperlipidemia, unspecified: Secondary | ICD-10-CM | POA: Diagnosis not present

## 2015-11-14 DIAGNOSIS — Z79899 Other long term (current) drug therapy: Secondary | ICD-10-CM | POA: Diagnosis not present

## 2015-11-14 DIAGNOSIS — I10 Essential (primary) hypertension: Secondary | ICD-10-CM | POA: Diagnosis not present

## 2015-11-14 DIAGNOSIS — M24071 Loose body in right ankle: Secondary | ICD-10-CM | POA: Diagnosis not present

## 2015-11-14 DIAGNOSIS — Z8249 Family history of ischemic heart disease and other diseases of the circulatory system: Secondary | ICD-10-CM | POA: Diagnosis not present

## 2015-11-14 DIAGNOSIS — M25771 Osteophyte, right ankle: Secondary | ICD-10-CM | POA: Diagnosis not present

## 2015-11-14 DIAGNOSIS — Z823 Family history of stroke: Secondary | ICD-10-CM | POA: Diagnosis not present

## 2015-11-14 LAB — BASIC METABOLIC PANEL
ANION GAP: 6 (ref 5–15)
BUN: 14 mg/dL (ref 6–20)
CALCIUM: 9.6 mg/dL (ref 8.9–10.3)
CO2: 29 mmol/L (ref 22–32)
CREATININE: 1.25 mg/dL — AB (ref 0.61–1.24)
Chloride: 101 mmol/L (ref 101–111)
GFR calc Af Amer: >60 mL/min (ref >60)
GLUCOSE: 100 mg/dL — AB (ref 65–99)
Potassium: 4.5 mmol/L (ref 3.5–5.1)
Sodium: 136 mmol/L (ref 135–145)

## 2015-11-14 LAB — CBC
HEMATOCRIT: 42.8 % (ref 39.0–52.0)
Hemoglobin: 14.4 g/dL (ref 13.0–17.0)
MCH: 31.2 pg (ref 26.0–34.0)
MCHC: 33.6 g/dL (ref 30.0–36.0)
MCV: 92.6 fL (ref 78.0–100.0)
PLATELETS: 215 10*3/uL (ref 150–400)
RBC: 4.62 MIL/uL (ref 4.22–5.81)
RDW: 12.9 % (ref 11.5–15.5)
WBC: 5.7 10*3/uL (ref 4.0–10.5)

## 2015-11-14 MED ORDER — CHLORHEXIDINE GLUCONATE 4 % EX LIQD: 60.0000 mL | Freq: Once | CUTANEOUS | Status: DC

## 2015-11-14 MED ORDER — LACTATED RINGERS IV SOLN
INTRAVENOUS | Status: DC
  Administered 2015-11-15: 11:00:00 via INTRAVENOUS

## 2015-11-14 MED ORDER — CEFAZOLIN SODIUM-DEXTROSE 2-3 GM-% IV SOLR: 2.0000 g | INTRAVENOUS | Status: DC

## 2015-11-14 NOTE — Progress Notes (Signed)
Anesthesia Chart Review:  Pt is 59 year old male scheduled for R ankle arthrotomy, R ankle loose body removal on 11/15/2015 with Dr. Rhona Raider.   Cardiologist is Dr. Vear Clock, last office visit 08/09/14 (in media tab)  PMH includes:  HTN, hyperlipidemia, seizures. Never smoker. BMI 30.5  Medications include: ASA, clonidine, lisinopril, simvastatin.   Preoperative labs reviewed.    EKG from Dr. Woody Seller' office older than 1 year. Will need EKG DOS.   Echo 09/02/14 (media tab):  - LV cavity is normal in size. Mild concentric hypertrophy. Normal global wall motion. Normal systolic function, EF [id number]. Evidence of grade I (impaired) diastolic dysfunction - Trace MR - Structurally normal tricuspid valve with trace TR.   Exercise stress test 08/31/14 (media tab):  - Negative for ischemia.  - Excellent exercise tolerance.  - 10 METS  If EKG acceptable DOS, I anticipate pt can proceed as scheduled.   Willeen Cass, FNP-BC [facility]/Anesthesiology Phone: [phone] 11/14/2015 3:37 PM

## 2015-11-15 ENCOUNTER — Ambulatory Visit (HOSPITAL_COMMUNITY): Payer: 59 | Admitting: Emergency Medicine

## 2015-11-15 ENCOUNTER — Ambulatory Visit (HOSPITAL_COMMUNITY): Payer: 59 | Admitting: Anesthesiology

## 2015-11-15 ENCOUNTER — Encounter (HOSPITAL_COMMUNITY)
Admission: RE | Disposition: A | Discharge status: Home / Self Care | Payer: Self-pay | Source: Ambulatory Visit | Attending: Orthopaedic Surgery

## 2015-11-15 ENCOUNTER — Ambulatory Visit (HOSPITAL_BASED_OUTPATIENT_CLINIC_OR_DEPARTMENT_OTHER)
Admission: RE | Admit: 2015-11-15 | Discharge: 2015-11-15 | Disposition: A | Discharge status: Home / Self Care | Payer: 59 | Source: Ambulatory Visit | Attending: Orthopaedic Surgery | Admitting: Orthopaedic Surgery

## 2015-11-15 DIAGNOSIS — E785 Hyperlipidemia, unspecified: Secondary | ICD-10-CM | POA: Diagnosis not present

## 2015-11-15 DIAGNOSIS — Z8249 Family history of ischemic heart disease and other diseases of the circulatory system: Secondary | ICD-10-CM | POA: Insufficient documentation

## 2015-11-15 DIAGNOSIS — M24071 Loose body in right ankle: Secondary | ICD-10-CM | POA: Insufficient documentation

## 2015-11-15 DIAGNOSIS — I10 Essential (primary) hypertension: Secondary | ICD-10-CM | POA: Diagnosis not present

## 2015-11-15 DIAGNOSIS — Z79899 Other long term (current) drug therapy: Secondary | ICD-10-CM | POA: Insufficient documentation

## 2015-11-15 DIAGNOSIS — M25771 Osteophyte, right ankle: Secondary | ICD-10-CM | POA: Insufficient documentation

## 2015-11-15 DIAGNOSIS — Z823 Family history of stroke: Secondary | ICD-10-CM | POA: Insufficient documentation

## 2015-11-15 HISTORY — PX: ARTHROTOMY: SHX5728

## 2015-11-15 HISTORY — PX: FOREIGN BODY REMOVAL: SHX962

## 2015-11-15 SURGERY — ARTHROTOMY
Anesthesia: General | Laterality: Right

## 2015-11-15 MED ORDER — PROPOFOL 10 MG/ML IV BOLUS
INTRAVENOUS | Status: AC
  Filled 2015-11-15: qty 20

## 2015-11-15 MED ORDER — BUPIVACAINE HCL (PF) 0.25 % IJ SOLN
INTRAMUSCULAR | Status: AC
  Filled 2015-11-15: qty 30

## 2015-11-15 MED ORDER — BUPIVACAINE-EPINEPHRINE (PF) 0.25% -1:200000 IJ SOLN
INTRAMUSCULAR | Status: AC
  Filled 2015-11-15: qty 30

## 2015-11-15 MED ORDER — MIDAZOLAM HCL 2 MG/2ML IJ SOLN
INTRAMUSCULAR | Status: AC
  Filled 2015-11-15: qty 2

## 2015-11-15 MED ORDER — 0.9 % SODIUM CHLORIDE (POUR BTL) OPTIME
TOPICAL | Status: DC | PRN
  Administered 2015-11-15: 1000 mL

## 2015-11-15 MED ORDER — FENTANYL CITRATE (PF) 250 MCG/5ML IJ SOLN
INTRAMUSCULAR | Status: AC
  Filled 2015-11-15: qty 5

## 2015-11-15 MED ORDER — PROMETHAZINE HCL 25 MG/ML IJ SOLN: 6.2500 mg | INTRAMUSCULAR | Status: DC | PRN

## 2015-11-15 MED ORDER — HYDROMORPHONE HCL 1 MG/ML IJ SOLN
0.2500 mg | INTRAMUSCULAR | Status: DC | PRN
  Administered 2015-11-15: 0.5 mg via INTRAVENOUS

## 2015-11-15 MED ORDER — LACTATED RINGERS IV SOLN
INTRAVENOUS | Status: DC | PRN
  Administered 2015-11-15: 12:00:00 via INTRAVENOUS

## 2015-11-15 MED ORDER — ONDANSETRON HCL 4 MG/2ML IJ SOLN
INTRAMUSCULAR | Status: AC
  Filled 2015-11-15: qty 4

## 2015-11-15 MED ORDER — PHENYLEPHRINE 40 MCG/ML (10ML) SYRINGE FOR IV PUSH (FOR BLOOD PRESSURE SUPPORT)
PREFILLED_SYRINGE | INTRAVENOUS | Status: AC
  Filled 2015-11-15: qty 10

## 2015-11-15 MED ORDER — BUPIVACAINE-EPINEPHRINE (PF) 0.25% -1:200000 IJ SOLN
INTRAMUSCULAR | Status: DC | PRN
  Administered 2015-11-15: 18 mL

## 2015-11-15 MED ORDER — CEFAZOLIN SODIUM-DEXTROSE 2-3 GM-% IV SOLR
INTRAVENOUS | Status: AC
Start: 2015-11-15 — End: 2015-11-15
  Filled 2015-11-15: qty 50

## 2015-11-15 MED ORDER — FENTANYL CITRATE (PF) 100 MCG/2ML IJ SOLN
INTRAMUSCULAR | Status: DC | PRN
  Administered 2015-11-15: 100 ug via INTRAVENOUS
  Administered 2015-11-15: 50 ug via INTRAVENOUS

## 2015-11-15 MED ORDER — ONDANSETRON HCL 4 MG/2ML IJ SOLN
INTRAMUSCULAR | Status: DC | PRN
  Administered 2015-11-15: 4 mg via INTRAVENOUS

## 2015-11-15 MED ORDER — LIDOCAINE HCL (CARDIAC) 20 MG/ML IV SOLN
INTRAVENOUS | Status: DC | PRN
  Administered 2015-11-15: 100 mg via INTRAVENOUS

## 2015-11-15 MED ORDER — LIDOCAINE HCL (CARDIAC) 20 MG/ML IV SOLN
INTRAVENOUS | Status: AC
  Filled 2015-11-15: qty 10

## 2015-11-15 MED ORDER — MIDAZOLAM HCL 5 MG/5ML IJ SOLN
INTRAMUSCULAR | Status: DC | PRN
  Administered 2015-11-15: 2 mg via INTRAVENOUS

## 2015-11-15 MED ORDER — PROPOFOL 10 MG/ML IV BOLUS
INTRAVENOUS | Status: DC | PRN
  Administered 2015-11-15: 200 mg via INTRAVENOUS

## 2015-11-15 MED ORDER — DEXMEDETOMIDINE HCL IN NACL 200 MCG/50ML IV SOLN
INTRAVENOUS | Status: AC
  Filled 2015-11-15: qty 50

## 2015-11-15 MED ORDER — HYDROMORPHONE HCL 1 MG/ML IJ SOLN
INTRAMUSCULAR | Status: AC
  Filled 2015-11-15: qty 1

## 2015-11-15 MED ORDER — MIDAZOLAM HCL 2 MG/2ML IJ SOLN: 0.5000 mg | Freq: Once | INTRAMUSCULAR | Status: DC | PRN

## 2015-11-15 MED ORDER — CEFAZOLIN SODIUM-DEXTROSE 2-3 GM-% IV SOLR
2.0000 g | Freq: Once | INTRAVENOUS | Status: AC
  Administered 2015-11-15: 2 g via INTRAVENOUS

## 2015-11-15 MED ORDER — MEPERIDINE HCL 25 MG/ML IJ SOLN: 6.2500 mg | INTRAMUSCULAR | Status: DC | PRN

## 2015-11-15 MED ORDER — SCOPOLAMINE 1 MG/3DAYS TD PT72
1.0000 | MEDICATED_PATCH | Freq: Once | TRANSDERMAL | Status: DC
  Administered 2015-11-15: 1.5 mg via TRANSDERMAL
  Filled 2015-11-15: qty 1

## 2015-11-15 SURGICAL SUPPLY — 50 items
BANDAGE ELASTIC 4 VELCRO ST LF (GAUZE/BANDAGES/DRESSINGS) ×2 IMPLANT
BANDAGE ELASTIC 6 VELCRO ST LF (GAUZE/BANDAGES/DRESSINGS) ×2 IMPLANT
BANDAGE ESMARK 6X9 LF (GAUZE/BANDAGES/DRESSINGS) IMPLANT
BLADE AVERAGE 25X9 (BLADE) ×2 IMPLANT
BLADE SURG 10 STRL SS (BLADE) ×2 IMPLANT
BNDG ESMARK 6X9 LF (GAUZE/BANDAGES/DRESSINGS)
BNDG GAUZE ELAST 4 BULKY (GAUZE/BANDAGES/DRESSINGS) ×2 IMPLANT
COVER SURGICAL LIGHT HANDLE (MISCELLANEOUS) ×2 IMPLANT
CUFF TOURNIQUET SINGLE 18IN (TOURNIQUET CUFF) ×2 IMPLANT
CUFF TOURNIQUET SINGLE 24IN (TOURNIQUET CUFF) IMPLANT
CUFF TOURNIQUET SINGLE 34IN LL (TOURNIQUET CUFF) IMPLANT
CUFF TOURNIQUET SINGLE 44IN (TOURNIQUET CUFF) IMPLANT
DECANTER SPIKE VIAL GLASS SM (MISCELLANEOUS) IMPLANT
DRAPE C-ARM 42X72 X-RAY (DRAPES) IMPLANT
DRAPE OEC MINIVIEW 54X84 (DRAPES) IMPLANT
DRAPE X RAY CASS MED 25220 (DRAPES) IMPLANT
DRSG ADAPTIC 3X8 NADH LF (GAUZE/BANDAGES/DRESSINGS) ×2 IMPLANT
DRSG PAD ABDOMINAL 8X10 ST (GAUZE/BANDAGES/DRESSINGS) ×2 IMPLANT
DURAPREP 26ML APPLICATOR (WOUND CARE) ×2 IMPLANT
ELECT REM PT RETURN 9FT ADLT (ELECTROSURGICAL) ×2
ELECTRODE REM PT RTRN 9FT ADLT (ELECTROSURGICAL) ×1 IMPLANT
GAUZE SPONGE 4X4 12PLY STRL (GAUZE/BANDAGES/DRESSINGS) ×2 IMPLANT
GLOVE BIO SURGEON STRL SZ8 (GLOVE) ×8 IMPLANT
GLOVE BIOGEL PI IND STRL 8 (GLOVE) ×1 IMPLANT
GLOVE BIOGEL PI INDICATOR 8 (GLOVE) ×1
GOWN STRL REUS W/ TWL LRG LVL3 (GOWN DISPOSABLE) ×3 IMPLANT
GOWN STRL REUS W/TWL 2XL LVL3 (GOWN DISPOSABLE) ×2 IMPLANT
GOWN STRL REUS W/TWL LRG LVL3 (GOWN DISPOSABLE) ×3
KIT BASIN OR (CUSTOM PROCEDURE TRAY) ×2 IMPLANT
KIT ROOM TURNOVER OR (KITS) ×2 IMPLANT
MANIFOLD NEPTUNE II (INSTRUMENTS) ×2 IMPLANT
NS IRRIG 1000ML POUR BTL (IV SOLUTION) ×2 IMPLANT
PACK ORTHO EXTREMITY (CUSTOM PROCEDURE TRAY) ×2 IMPLANT
PAD ARMBOARD 7.5X6 YLW CONV (MISCELLANEOUS) ×2 IMPLANT
PAD CAST 4YDX4 CTTN HI CHSV (CAST SUPPLIES) ×1 IMPLANT
PADDING CAST COTTON 4X4 STRL (CAST SUPPLIES) ×1
SPONGE GAUZE 4X4 12PLY STER LF (GAUZE/BANDAGES/DRESSINGS) ×2 IMPLANT
SPONGE LAP 4X18 X RAY DECT (DISPOSABLE) ×4 IMPLANT
STAPLER VISISTAT 35W (STAPLE) IMPLANT
STRIP CLOSURE SKIN 1/2X4 (GAUZE/BANDAGES/DRESSINGS) ×2 IMPLANT
SUCTION FRAZIER TIP 10 FR DISP (SUCTIONS) ×2 IMPLANT
SUT VIC AB 0 CT1 27 (SUTURE) ×2
SUT VIC AB 0 CT1 27XBRD ANBCTR (SUTURE) ×2 IMPLANT
SUT VIC AB 2-0 CT1 27 (SUTURE) ×2
SUT VIC AB 2-0 CT1 TAPERPNT 27 (SUTURE) ×2 IMPLANT
TOWEL OR 17X24 6PK STRL BLUE (TOWEL DISPOSABLE) ×2 IMPLANT
TOWEL OR 17X26 10 PK STRL BLUE (TOWEL DISPOSABLE) ×2 IMPLANT
TUBE CONNECTING 12X1/4 (SUCTIONS) ×2 IMPLANT
WATER STERILE IRR 1000ML POUR (IV SOLUTION) IMPLANT
YANKAUER SUCT BULB TIP NO VENT (SUCTIONS) IMPLANT

## 2015-11-15 NOTE — Interval H&P Note (Signed)
OK for surgery PD 

## 2015-11-15 NOTE — Anesthesia Procedure Notes (Signed)
Procedure Name: LMA Insertion Date/Time: 11/15/2015 1:52 PM Performed by: Mariea Clonts Pre-anesthesia Checklist: Emergency Drugs available, Patient identified, Timeout performed, Suction available and Patient being monitored Patient Re-evaluated:Patient Re-evaluated prior to inductionOxygen Delivery Method: Circle system utilized Preoxygenation: Pre-oxygenation with 100% oxygen Intubation Type: IV induction Ventilation: Mask ventilation without difficulty LMA: LMA inserted LMA Size: 5.0 Number of attempts: 1 Placement Confirmation: positive ETCO2 and breath sounds checked- equal and bilateral Tube secured with: Tape Dental Injury: Teeth and Oropharynx as per pre-operative assessment

## 2015-11-15 NOTE — Anesthesia Postprocedure Evaluation (Signed)
Anesthesia Post Note  Patient: Ricardo Garcia  Procedure(s) Performed: Procedure(s) (LRB): RIGHT ANKLE ARTHROTOMY  (Right) RIGHT ANKLE LOOSE BODY REMOVAL (Right)  Patient location during evaluation: PACU Anesthesia Type: General Level of consciousness: awake and alert Pain management: pain level controlled Vital Signs Assessment: post-procedure vital signs reviewed and stable Respiratory status: spontaneous breathing, nonlabored ventilation and respiratory function stable Cardiovascular status: blood pressure returned to baseline and stable Postop Assessment: No signs of nausea or vomiting Anesthetic complications: no    Last Vitals:  Filed Vitals:   11/15/15 1535 11/15/15 1540  BP: 137/78 149/77  Pulse: 59   Temp: 36.4 C   Resp: 14 16    Last Pain:  Filed Vitals:   11/15/15 1542  PainSc: 3         RLE Motor Response: Purposeful movement, Responds to commands (wiggles toes) RLE Sensation: Full sensation      Ivory Bail,W. EDMOND

## 2015-11-15 NOTE — Op Note (Signed)
NAME:  Ricardo Garcia, Ricardo Garcia                 ACCOUNT NO.:  [id number]  MEDICAL RECORD NO.:  [id number]  LOCATION:  [facility]                         FACILITY:  [facility]  PHYSICIAN:  Monico Blitz. Jammie Clink, M.D.DATE OF BIRTH:  1956-07-25  DATE OF PROCEDURE:  11/15/2015 DATE OF DISCHARGE:  11/15/2015                              OPERATIVE REPORT   PREOPERATIVE DIAGNOSES: 1. Right ankle loose body. 2. Right ankle osteophyte.  POSTOPERATIVE DIAGNOSES: 1. Right ankle loose body. 2. Right ankle osteophyte.  PROCEDURES: 1. Right ankle loose body removal. 2. Right ankle osteophyte excision.  ANESTHESIA:  General.  ATTENDING SURGEON:  Monico Blitz. Rhona Raider, M.D.  ASSISTANT:  Loni Dolly, PA.  INDICATION FOR PROCEDURE:  The patient is a 59 year old male with long history of right ankle issues.  He persists with a catch and lock in his ankle.  This persists despite bracing and therapy and an injection, which did afford him transient relief.  He has an instability of his ankle and had catches and locks up and he has pain.  He is offered an arthrotomy with removal of the prominent osteophyte along the talar neck along with removal of loose body.  Informed operative consent was obtained after discussion of possible complications including reaction to anesthesia, infection and neurovascular injury.  SUMMARY OF FINDINGS AND PROCEDURE:  Under general anesthesia through a small anterior arthrotomy, I removed a 1-cm loose body and a slightly larger osteophyte.  This was done under fluoroscopic guidance.  He was closed primarily and discharged home.  DESCRIPTION OF PROCEDURE:  The patient was taken to the operating suite where general anesthetic was applied without difficulty.  He was positioned supine and prepped and draped in normal sterile fashion. After administration of preop IV Kefzol and an appropriate time-out, the right ankle was elevated, exsanguinated, tourniquet inflated about the calf.  I made an  anterior incision just lateral to the neurovascular bundle.  Dissection was carried down to the anterior aspect of the joint.  An obvious loose body was removed and measured about a centimeter in diameter.  I then used fluoroscopy to identify the bony prominence.  This was exposed and then removed with the saw and rongeur. Fluoroscopy was used to confirm adequate resection.  Thorough irrigation was performed.  The tourniquet was deflated and a small amount of bleeding was easily controlled with some pressure.  We injected some Marcaine with epinephrine about the incision site followed by reapproximation of subcutaneous tissues with 2-0 undyed Vicryl and skin closure with a subcuticular stitch and some loose Steri-Strips.  Adaptic was applied followed by dry gauze and a loose Ace wrap.  Estimated blood loss and intraoperative fluids as well as accurate tourniquet time can be obtained from anesthesia records.  DISPOSITION:  The patient was extubated in the operating room and taken to recovery room in stable addition.  He was to go home same-day and follow up in the office next week.  I will contact him by phone tonight.     Monico Blitz Rhona Raider, M.D.     PGD/MEDQ  D:  11/15/2015  T:  11/15/2015  Job:  [id number]

## 2015-11-15 NOTE — Progress Notes (Signed)
Orthopedic Tech Progress Note Patient Details:  Ricardo Garcia 1956-04-04 [id number]  Ortho Devices Type of Ortho Device: Postop shoe/boot Ortho Device/Splint Location: rle Ortho Device/Splint Interventions: Application Viewed order from doctor's order list  Hildred Priest 11/15/2015, 3:11 PM

## 2015-11-15 NOTE — Transfer of Care (Signed)
Immediate Anesthesia Transfer of Care Note  Patient: Ricardo Garcia  Procedure(s) Performed: Procedure(s): RIGHT ANKLE ARTHROTOMY  (Right) RIGHT ANKLE LOOSE BODY REMOVAL (Right)  Patient Location: PACU  Anesthesia Type:General  Level of Consciousness: awake, alert  and oriented  Airway & Oxygen Therapy: Patient Spontanous Breathing and Patient connected to nasal cannula oxygen  Post-op Assessment: Report given to RN and Post -op Vital signs reviewed and stable  Post vital signs: Reviewed and stable  Last Vitals:  Filed Vitals:   11/15/15 1133 11/15/15 1450  BP: 149/76   Pulse: 73   Temp: 36.7 C 36.5 C  Resp: 16     Complications: No apparent anesthesia complications

## 2015-11-15 NOTE — Op Note (Signed)
#  [id number] 

## 2015-11-15 NOTE — Anesthesia Preprocedure Evaluation (Addendum)
Anesthesia Evaluation  Patient identified by MRN, date of birth, ID band Patient awake    Reviewed: Allergy & Precautions, NPO status , Patient's Chart, lab work & pertinent test results  History of Anesthesia Complications (+) PONV and history of anesthetic complications  Airway Mallampati: II  TM Distance: >3 FB Neck ROM: Full    Dental  (+) Dental Advisory Given   Pulmonary neg pulmonary ROS,    breath sounds clear to auscultation       Cardiovascular hypertension, Pt. on medications (-) angina Rhythm:Regular Rate:Normal  '15 ECHO: EF 55%, valves OK   Neuro/Psych Seizures -,  negative neurological ROS     GI/Hepatic negative GI ROS, Neg liver ROS,   Endo/Other  Morbid obesity  Renal/GU negative Renal ROS     Musculoskeletal  (+) Arthritis , Osteoarthritis,    Abdominal (+) + obese,   Peds  Hematology negative hematology ROS (+)   Anesthesia Other Findings   Reproductive/Obstetrics                          Anesthesia Physical Anesthesia Plan  ASA: II  Anesthesia Plan: General   Post-op Pain Management:    Induction: Intravenous  Airway Management Planned: LMA  Additional Equipment:   Intra-op Plan:   Post-operative Plan:   Informed Consent: I have reviewed the patients History and Physical, chart, labs and discussed the procedure including the risks, benefits and alternatives for the proposed anesthesia with the patient or authorized representative who has indicated his/her understanding and acceptance.   Dental advisory given  Plan Discussed with: CRNA and Surgeon  Anesthesia Plan Comments: (Plan routine monitors, GA- LMA OK)        Anesthesia Quick Evaluation

## 2015-11-21 ENCOUNTER — Encounter (HOSPITAL_COMMUNITY): Payer: Self-pay | Admitting: Orthopaedic Surgery

## 2015-12-10 ENCOUNTER — Telehealth: Payer: Self-pay

## 2015-12-10 NOTE — Telephone Encounter (Signed)
Patient is requesting his bills for the year.   Please mail to current address.     [phone] Jerilynn Mages)

## 2015-12-12 NOTE — Telephone Encounter (Signed)
Patient was not seen in 2016 so I had nothing to print to send to the patient.

## 2016-01-31 ENCOUNTER — Ambulatory Visit: Payer: 59 | Admitting: Emergency Medicine

## 2016-02-02 ENCOUNTER — Ambulatory Visit (INDEPENDENT_AMBULATORY_CARE_PROVIDER_SITE_OTHER): Payer: 59 | Admitting: Emergency Medicine

## 2016-02-02 ENCOUNTER — Encounter: Payer: Self-pay | Admitting: Emergency Medicine

## 2016-02-02 VITALS — BP 131/79 | HR 77 | Temp 98.3°F | Resp 16 | Wt 234.0 lb

## 2016-02-02 DIAGNOSIS — E785 Hyperlipidemia, unspecified: Secondary | ICD-10-CM

## 2016-02-02 DIAGNOSIS — L821 Other seborrheic keratosis: Secondary | ICD-10-CM | POA: Diagnosis not present

## 2016-02-02 DIAGNOSIS — J0191 Acute recurrent sinusitis, unspecified: Secondary | ICD-10-CM | POA: Diagnosis not present

## 2016-02-02 DIAGNOSIS — I10 Essential (primary) hypertension: Secondary | ICD-10-CM

## 2016-02-02 LAB — CBC WITH DIFFERENTIAL/PLATELET
BASOS ABS: 0.1 10*3/uL (ref 0.0–0.1)
Basophils Relative: 1 % (ref 0–1)
EOS PCT: 3 % (ref 0–5)
Eosinophils Absolute: 0.2 10*3/uL (ref 0.0–0.7)
HEMATOCRIT: 45.3 % (ref 39.0–52.0)
Hemoglobin: 15.1 g/dL (ref 13.0–17.0)
LYMPHS PCT: 18 % (ref 12–46)
Lymphs Abs: 1 10*3/uL (ref 0.7–4.0)
MCH: 30.6 pg (ref 26.0–34.0)
MCHC: 33.3 g/dL (ref 30.0–36.0)
MCV: 91.9 fL (ref 78.0–100.0)
MPV: 9.2 fL (ref 8.6–12.4)
Monocytes Absolute: 0.4 10*3/uL (ref 0.1–1.0)
Monocytes Relative: 8 % (ref 3–12)
NEUTROS PCT: 70 % (ref 43–77)
Neutro Abs: 3.9 10*3/uL (ref 1.7–7.7)
PLATELETS: 229 10*3/uL (ref 150–400)
RBC: 4.93 MIL/uL (ref 4.22–5.81)
RDW: 13.6 % (ref 11.5–15.5)
WBC: 5.5 10*3/uL (ref 4.0–10.5)

## 2016-02-02 NOTE — Progress Notes (Signed)
Patient ID: Ricardo Garcia, male   DOB: 1956/05/13, 60 y.o.   MRN: [id number]     By signing my name below, I, Zola Button, attest that this documentation has been prepared under the direction and in the presence of Arlyss Queen, MD.  Electronically Signed: Zola Button, Medical Scribe. 02/02/2016. 4:46 PM.   Chief Complaint:  Chief Complaint  Patient presents with  . medication check    HPI: Ricardo Garcia is a 60 y.o. male with a history of hypertension, hyperlipidemia, and chronic sinusitis who reports to [facility] today for a medication check. Patient has been doing well overall. He has been exercising every day. He did receive the flu vaccine this year.  Patient notes he has not had a chance to send in the Cologuard yet but plans to do so.   Patient notes that he was started on allergy drops 6 months ago for his chronic sinusitis. He has not noticed any improvement with this.  Patient has a mole  he would like to be examined. He has seen Dr. Allyson Sabal in the past for skin tags and last saw him about a year and a half ago.  His daughter is in the science club.   Past Medical History  Diagnosis Date  . Hypertension   . Hyperlipidemia   . Seizures ([place])   . Chemotherapeutic agent or infusion extravasation    Past Surgical History  Procedure Laterality Date  . Hernia repair    . Arthrotomy Right 11/15/2015    Procedure: RIGHT ANKLE ARTHROTOMY ;  Surgeon: Melrose Nakayama, MD;  Location: [facility];  Service: Orthopedics;  Laterality: Right;  . Foreign body removal Right 11/15/2015    Procedure: RIGHT ANKLE LOOSE BODY REMOVAL;  Surgeon: Melrose Nakayama, MD;  Location: [facility];  Service: Orthopedics;  Laterality: Right;   Social History   Social History  . Marital Status: Divorced    Spouse Name: N/A  . Number of Children: N/A  . Years of Education: N/A   Social History Main Topics  . Smoking status: Never Smoker   . Smokeless tobacco: Never Used  . Alcohol Use: 4.2 oz/week    7 Standard  drinks or equivalent per week     Comment: daily  . Drug Use: No  . Sexual Activity:    Partners: Female   Other Topics Concern  . None   Social History Narrative   Family History  Problem Relation Age of Onset  . Hypertension Mother   . Stroke Mother    Not on File Prior to Admission medications   Medication Sig Start Date End Date Taking? Authorizing Provider  aspirin 81 MG tablet Take 81 mg by mouth daily.    Yes Historical Provider, MD  cloNIDine (CATAPRES) 0.1 MG tablet TAKE ONE TABLET BY MOUTH TWICE DAILY. 08/15/15  Yes Chelle Jeffery, PA-C  halobetasol (ULTRAVATE) 0.05 % cream APPLY TOPICALLY 2 (TWO) TIMES DAILY. 05/30/15  Yes Darlyne Russian, MD  lisinopril (PRINIVIL,ZESTRIL) 10 MG tablet TAKE ONE TABLET BY MOUTH ONCE DAILY 08/15/15  Yes Chelle Jeffery, PA-C  simvastatin (ZOCOR) 40 MG tablet TAKE ONE TABLET BY MOUTH ONCE DAILY IN THE MORNING 08/15/15  Yes Chelle Jeffery, PA-C  amoxicillin-clavulanate (AUGMENTIN) 875-125 MG tablet Take 1 tablet by mouth 2 (two) times daily. Patient not taking: Reported on 02/02/2016 11/01/15   Wendie Agreste, MD  montelukast (SINGULAIR) 10 MG tablet Take 1 tablet (10 mg total) by mouth at bedtime. Patient not taking: Reported on 11/10/2015 03/03/15  Shawnee Knapp, MD     ROS: The patient denies fevers, chills, night sweats, unintentional weight loss, chest pain, palpitations, wheezing, dyspnea on exertion, nausea, vomiting, abdominal pain, dysuria, hematuria, melena, numbness, weakness, or tingling.   All other systems have been reviewed and were otherwise negative with the exception of those mentioned in the HPI and as above.    PHYSICAL EXAM: Filed Vitals:   02/02/16 1632  BP: 131/79  Pulse: 77  Temp: 98.3 F (36.8 C)  Resp: 16   Body mass index is 30.03 kg/(m^2).   General: Alert, no acute distress HEENT:  Normocephalic, atraumatic, oropharynx patent. Eye: [facility] Cardiovascular:  Regular rate and rhythm, no rubs murmurs or  gallops.  No Carotid bruits, radial pulse intact. No pedal edema.  Respiratory: Clear to auscultation bilaterally.  No wheezes, rales, or rhonchi.  No cyanosis, no use of accessory musculature Abdominal: No organomegaly, abdomen is soft and non-tender, positive bowel sounds.  No masses. Musculoskeletal: Gait intact. No edema, tenderness Skin: There is a 1 1.5 cm waxy mole, left lower abdomen consistent with a seborrheic keratosis. Neurologic: Facial musculature symmetric. Psychiatric: Patient acts appropriately throughout our interaction. Lymphatic: No cervical or submandibular lymphadenopathy    LABS:    EKG/XRAY:   Primary read interpreted by Dr. Everlene Farrier at [facility].   ASSESSMENT/PLAN: Patient states he is doing well, trying to limit his alcohol intake. Taking his medications as prescribed. His foot problem has improved since his right ankle surgery. He continues to struggle with sinus disease. Referral to dermatology made to have full body skin check.I personally performed the services described in this documentation, which was scribed in my presence. The recorded information has been reviewed and is accurate.    Gross sideeffects, risk and benefits, and alternatives of medications d/w patient. Patient is aware that all medications have potential sideeffects and we are unable to predict every sideeffect or drug-drug interaction that may occur.  Arlyss Queen MD 02/02/2016 4:46 PM

## 2016-02-03 ENCOUNTER — Encounter: Payer: Self-pay | Admitting: Emergency Medicine

## 2016-02-03 LAB — COMPLETE METABOLIC PANEL WITH GFR
ALT: 23 U/L (ref 9–46)
AST: 20 U/L (ref 10–35)
Albumin: 4.1 g/dL (ref 3.6–5.1)
Alkaline Phosphatase: 56 U/L (ref 40–115)
BUN: 14 mg/dL (ref 7–25)
CALCIUM: 9.6 mg/dL (ref 8.6–10.3)
CHLORIDE: 101 mmol/L (ref 98–110)
CO2: 24 mmol/L (ref 20–31)
CREATININE: 1.04 mg/dL (ref 0.70–1.25)
GFR, Est African American: >89 mL/min (ref >=60)
GFR, Est Non African American: 78 mL/min (ref >=60)
Glucose, Bld: 97 mg/dL (ref 65–99)
Potassium: 4.5 mmol/L (ref 3.5–5.3)
Sodium: 139 mmol/L (ref 135–146)
Total Bilirubin: 0.4 mg/dL (ref 0.2–1.2)
Total Protein: 6.8 g/dL (ref 6.1–8.1)

## 2016-02-03 LAB — LIPID PANEL
Cholesterol: 186 mg/dL (ref 125–200)
HDL: 55 mg/dL (ref >=40)
LDL CALC: 102 mg/dL (ref <130)
Total CHOL/HDL Ratio: 3.4 Ratio (ref <=5.0)
Triglycerides: 143 mg/dL (ref <150)
VLDL: 29 mg/dL (ref <30)

## 2016-02-15 ENCOUNTER — Other Ambulatory Visit: Payer: Self-pay | Admitting: Physician Assistant

## 2016-04-01 ENCOUNTER — Telehealth: Payer: Self-pay | Admitting: Radiology

## 2016-04-01 ENCOUNTER — Ambulatory Visit (INDEPENDENT_AMBULATORY_CARE_PROVIDER_SITE_OTHER): Payer: 59 | Admitting: Internal Medicine

## 2016-04-01 VITALS — BP 140/78 | HR 86 | Temp 97.8°F | Resp 16 | Ht 72.0 in | Wt 232.0 lb

## 2016-04-01 DIAGNOSIS — J0191 Acute recurrent sinusitis, unspecified: Secondary | ICD-10-CM

## 2016-04-01 MED ORDER — AMOXICILLIN-POT CLAVULANATE 875-125 MG PO TABS: 1.0000 | ORAL_TABLET | Freq: Two times a day (BID) | ORAL | Status: DC

## 2016-04-01 NOTE — Progress Notes (Signed)
Subjective:  By signing my name below, I, Ricardo Garcia, attest that this documentation has been prepared under the direction and in the presence of [name], MD. Electronically Signed: Moises Garcia, [place]. 04/01/2016 , 8:29 AM .  Patient was seen in Room 11 .   Patient ID: Ricardo Garcia, male    DOB: September 18, 1956, 60 y.o.   MRN: [id number] Chief Complaint  Patient presents with  . URI    x1 week   HPI DAISHAUN Garcia is a 60 y.o. male who has h/o recurrent sinusitis presents to [facility] complaining of persistent cough with some sinus pressure that started about a week ago. He mostly coughs in the morning and not as much at night. He's also noticed some tinnitus yesterday. He denies fever or nausea.   Patient Active Problem List   Diagnosis Date Noted  . Unspecified sinusitis (chronic) 07/28/2013  . Hypertension 01/26/2012  . Hyperlipidemia 01/26/2012    Current outpatient prescriptions:  .  aspirin 81 MG tablet, Take 81 mg by mouth daily. , Disp: , Rfl:  .  cloNIDine (CATAPRES) 0.1 MG tablet, TAKE ONE TABLET BY MOUTH TWICE DAILY., Disp: 180 tablet, Rfl: 5 .  lisinopril (PRINIVIL,ZESTRIL) 10 MG tablet, TAKE ONE TABLET BY MOUTH ONCE DAILY, Disp: 90 tablet, Rfl: 1 .  simvastatin (ZOCOR) 40 MG tablet, TAKE ONE TABLET BY MOUTH ONCE DAILY IN THE MORNING, Disp: 90 tablet, Rfl: 3 .  amoxicillin-clavulanate (AUGMENTIN) 875-125 MG tablet, Take 1 tablet by mouth 2 (two) times daily., Disp: 20 tablet, Rfl: 1 .  halobetasol (ULTRAVATE) 0.05 % cream, APPLY TOPICALLY 2 (TWO) TIMES DAILY. (Patient not taking: Reported on 04/01/2016), Disp: 50 g, Rfl: 7 .  montelukast (SINGULAIR) 10 MG tablet, Take 1 tablet (10 mg total) by mouth at bedtime. (Patient not taking: Reported on 11/10/2015), Disp: 30 tablet, Rfl: 5  Review of Systems  Constitutional: Negative for fever, chills, appetite change and fatigue.  HENT: Positive for congestion, rhinorrhea and sinus pressure.   Respiratory: Positive for  cough. Negative for shortness of breath and wheezing.   Gastrointestinal: Negative for nausea and vomiting.  Neurological: Negative for headaches.      Objective:   Physical Exam  Constitutional: He is oriented to person, place, and time. He appears well-developed and well-nourished. No distress.  HENT:  Head: Normocephalic and atraumatic.  Right Ear: Tympanic membrane normal.  Left Ear: Tympanic membrane normal.  Mouth/Throat: Posterior oropharyngeal erythema present.  Purulent mucus  Eyes: EOM are normal. Pupils are equal, round, and reactive to light.  Neck: Neck supple.  Cardiovascular: Normal rate.   Pulmonary/Chest: Effort normal and breath sounds normal. No respiratory distress. He has no wheezes.  Musculoskeletal: Normal range of motion.  Neurological: He is alert and oriented to person, place, and time.  Skin: Skin is warm and dry.  Psychiatric: He has a normal mood and affect. His behavior is normal.  Nursing note and vitals reviewed.  BP 140/78 mmHg  Pulse 86  Temp(Src) 97.8 F (36.6 C)  Resp 16  Ht 6' (1.829 m)  Wt 232 lb (105.235 kg)  BMI 31.46 kg/m2  SpO2 98%    Assessment & Plan:  I have completed the patient encounter in its entirety as documented by the scribe, with editing by me where necessary. [name] Pastor, M.D.  Acute recurrent sinusitis, unspecified location   Meds ordered this encounter  Medications  . amoxicillin-clavulanate (AUGMENTIN) 875-125 MG tablet    Sig: Take 1 tablet by mouth 2 (  two) times daily.    Dispense:  20 tablet    Refill:  1  Sudafed 12 hr flonase 53mo

## 2016-04-01 NOTE — Patient Instructions (Signed)
     IF you received an x-ray today, you will receive an invoice from [facility]. Please contact [facility] at [phone] with questions or concerns regarding your invoice.   IF you received labwork today, you will receive an invoice from [facility]. Please contact [facility] at [phone] with questions or concerns regarding your invoice.   Our billing staff will not be able to assist you with questions regarding bills from these companies.  You will be contacted with the lab results as soon as they are available. The fastest way to get your results is to activate your My Chart account. Instructions are located on the last page of this paperwork. If you have not heard from us regarding the results in 2 weeks, please contact this office.      

## 2016-04-01 NOTE — Telephone Encounter (Signed)
Called patient/ he left his jacket here this am, he will come by to p/u

## 2016-07-19 ENCOUNTER — Encounter: Payer: Self-pay | Admitting: Emergency Medicine

## 2016-07-19 ENCOUNTER — Ambulatory Visit (INDEPENDENT_AMBULATORY_CARE_PROVIDER_SITE_OTHER): Payer: 59 | Admitting: Emergency Medicine

## 2016-07-19 VITALS — BP 114/80 | HR 75 | Temp 98.3°F | Ht 73.0 in | Wt 236.2 lb

## 2016-07-19 DIAGNOSIS — Z125 Encounter for screening for malignant neoplasm of prostate: Secondary | ICD-10-CM

## 2016-07-19 DIAGNOSIS — Z1159 Encounter for screening for other viral diseases: Secondary | ICD-10-CM

## 2016-07-19 DIAGNOSIS — I10 Essential (primary) hypertension: Secondary | ICD-10-CM

## 2016-07-19 DIAGNOSIS — J324 Chronic pansinusitis: Secondary | ICD-10-CM

## 2016-07-19 DIAGNOSIS — E785 Hyperlipidemia, unspecified: Secondary | ICD-10-CM

## 2016-07-19 LAB — LIPID PANEL
CHOL/HDL RATIO: 2.9 ratio (ref <=5.0)
Cholesterol: 176 mg/dL (ref 125–200)
HDL: 60 mg/dL (ref >=40)
LDL CALC: 100 mg/dL (ref <130)
TRIGLYCERIDES: 80 mg/dL (ref <150)
VLDL: 16 mg/dL (ref <30)

## 2016-07-19 LAB — CBC WITH DIFFERENTIAL/PLATELET
BASOS ABS: 60 {cells}/uL (ref 0–200)
Basophils Relative: 1 %
Eosinophils Absolute: 240 cells/uL (ref 15–500)
Eosinophils Relative: 4 %
HEMATOCRIT: 44.2 % (ref 38.5–50.0)
HEMOGLOBIN: 15.2 g/dL (ref 13.2–17.1)
LYMPHS ABS: 1080 {cells}/uL (ref 850–3900)
Lymphocytes Relative: 18 %
MCH: 31.3 pg (ref 27.0–33.0)
MCHC: 34.4 g/dL (ref 32.0–36.0)
MCV: 91.1 fL (ref 80.0–100.0)
MONO ABS: 660 {cells}/uL (ref 200–950)
MPV: 9.8 fL (ref 7.5–12.5)
Monocytes Relative: 11 %
NEUTROS ABS: 3960 {cells}/uL (ref 1500–7800)
NEUTROS PCT: 66 %
Platelets: 245 10*3/uL (ref 140–400)
RBC: 4.85 MIL/uL (ref 4.20–5.80)
RDW: 13.3 % (ref 11.0–15.0)
WBC: 6 10*3/uL (ref 3.8–10.8)

## 2016-07-19 LAB — BASIC METABOLIC PANEL WITH GFR
BUN: 12 mg/dL (ref 7–25)
CALCIUM: 9.6 mg/dL (ref 8.6–10.3)
CHLORIDE: 103 mmol/L (ref 98–110)
CO2: 26 mmol/L (ref 20–31)
CREATININE: 1.24 mg/dL (ref 0.70–1.25)
GFR, Est African American: 73 mL/min (ref >=60)
GFR, Est Non African American: 63 mL/min (ref >=60)
GLUCOSE: 115 mg/dL — AB (ref 65–99)
Potassium: 4.5 mmol/L (ref 3.5–5.3)
Sodium: 137 mmol/L (ref 135–146)

## 2016-07-19 LAB — HEPATITIS C ANTIBODY: HCV Ab: NEGATIVE

## 2016-07-19 NOTE — Patient Instructions (Addendum)
   IF you received an x-ray today, you will receive an invoice from [facility]. Please contact [facility] at [phone] with questions or concerns regarding your invoice.   IF you received labwork today, you will receive an invoice from [facility]. Please contact [facility] at [phone] with questions or concerns regarding your invoice.   Our billing staff will not be able to assist you with questions regarding bills from these companies.  You will be contacted with the lab results as soon as they are available. The fastest way to get your results is to activate your My Chart account. Instructions are located on the last page of this paperwork. If you have not heard from us regarding the results in 2 weeks, please contact this office.    Health Maintenance, Male A healthy lifestyle and preventative care can promote health and wellness.  Maintain regular health, dental, and eye exams.  Eat a healthy diet. Foods like vegetables, fruits, whole grains, low-fat dairy products, and lean protein foods contain the nutrients you need and are low in calories. Decrease your intake of foods high in solid fats, added sugars, and salt. Get information about a proper diet from your health care provider, if necessary.  Regular physical exercise is one of the most important things you can do for your health. Most adults should get at least 150 minutes of moderate-intensity exercise (any activity that increases your heart rate and causes you to sweat) each week. In addition, most adults need muscle-strengthening exercises on 2 or more days a week.   Maintain a healthy weight. The body mass index (BMI) is a screening tool to identify possible weight problems. It provides an estimate of body fat based on height and weight. Your health care provider can find your BMI and can help you achieve or maintain a healthy weight. For males 20 years and older:  A BMI  below 18.5 is considered underweight.  A BMI of 18.5 to 24.9 is normal.  A BMI of 25 to 29.9 is considered overweight.  A BMI of 30 and above is considered obese.  Maintain normal blood lipids and cholesterol by exercising and minimizing your intake of saturated fat. Eat a balanced diet with plenty of fruits and vegetables. Blood tests for lipids and cholesterol should begin at age 20 and be repeated every 5 years. If your lipid or cholesterol levels are high, you are over age 50, or you are at high risk for heart disease, you may need your cholesterol levels checked more frequently.Ongoing high lipid and cholesterol levels should be treated with medicines if diet and exercise are not working.  If you smoke, find out from your health care provider how to quit. If you do not use tobacco, do not start.  Lung cancer screening is recommended for adults aged 55-80 years who are at high risk for developing lung cancer because of a history of smoking. A yearly low-dose CT scan of the lungs is recommended for people who have at least a 30-pack-year history of smoking and are current smokers or have quit within the past 15 years. A pack year of smoking is smoking an average of 1 pack of cigarettes a day for 1 year (for example, a 30-pack-year history of smoking could mean smoking 1 pack a day for 30 years or 2 packs a day for 15 years). Yearly screening should continue until the smoker has stopped smoking for at least 15 years. Yearly screening should be stopped   for people who develop a health problem that would prevent them from having lung cancer treatment.  If you choose to drink alcohol, do not have more than 2 drinks per day. One drink is considered to be 12 oz (360 mL) of beer, 5 oz (150 mL) of wine, or 1.5 oz (45 mL) of liquor.  Avoid the use of street drugs. Do not share needles with anyone. Ask for help if you need support or instructions about stopping the use of drugs.  High blood pressure  causes heart disease and increases the risk of stroke. High blood pressure is more likely to develop in:  People who have blood pressure in the end of the normal range (100-139/85-89 mm Hg).  People who are overweight or obese.  People who are African American.  If you are 18-39 years of age, have your blood pressure checked every 3-5 years. If you are 40 years of age or older, have your blood pressure checked every year. You should have your blood pressure measured twice--once when you are at a hospital or clinic, and once when you are not at a hospital or clinic. Record the average of the two measurements. To check your blood pressure when you are not at a hospital or clinic, you can use:  An automated blood pressure machine at a pharmacy.  A home blood pressure monitor.  If you are 45-79 years old, ask your health care provider if you should take aspirin to prevent heart disease.  Diabetes screening involves taking a blood sample to check your fasting blood sugar level. This should be done once every 3 years after age 45 if you are at a normal weight and without risk factors for diabetes. Testing should be considered at a younger age or be carried out more frequently if you are overweight and have at least 1 risk factor for diabetes.  Colorectal cancer can be detected and often prevented. Most routine colorectal cancer screening begins at the age of 50 and continues through age 75. However, your health care provider may recommend screening at an earlier age if you have risk factors for colon cancer. On a yearly basis, your health care provider may provide home test kits to check for hidden blood in the stool. A small camera at the end of a tube may be used to directly examine the colon (sigmoidoscopy or colonoscopy) to detect the earliest forms of colorectal cancer. Talk to your health care provider about this at age 50 when routine screening begins. A direct exam of the colon should be repeated  every 5-10 years through age 75, unless early forms of precancerous polyps or small growths are found.  People who are at an increased risk for hepatitis B should be screened for this virus. You are considered at high risk for hepatitis B if:  You were born in a country where hepatitis B occurs often. Talk with your health care provider about which countries are considered high risk.  Your parents were born in a high-risk country and you have not received a shot to protect against hepatitis B (hepatitis B vaccine).  You have HIV or AIDS.  You use needles to inject street drugs.  You live with, or have sex with, someone who has hepatitis B.  You are a man who has sex with other men (MSM).  You get hemodialysis treatment.  You take certain medicines for conditions like cancer, organ transplantation, and autoimmune conditions.  Hepatitis C blood testing is recommended for   all people born from 1945 through 1965 and any individual with known risk factors for hepatitis C.  Healthy men should no longer receive prostate-specific antigen (PSA) blood tests as part of routine cancer screening. Talk to your health care provider about prostate cancer screening.  Testicular cancer screening is not recommended for adolescents or adult males who have no symptoms. Screening includes self-exam, a health care provider exam, and other screening tests. Consult with your health care provider about any symptoms you have or any concerns you have about testicular cancer.  Practice safe sex. Use condoms and avoid high-risk sexual practices to reduce the spread of sexually transmitted infections (STIs).  You should be screened for STIs, including gonorrhea and chlamydia if:  You are sexually active and are younger than 24 years.  You are older than 24 years, and your health care provider tells you that you are at risk for this type of infection.  Your sexual activity has changed since you were last screened,  and you are at an increased risk for chlamydia or gonorrhea. Ask your health care provider if you are at risk.  If you are at risk of being infected with HIV, it is recommended that you take a prescription medicine daily to prevent HIV infection. This is called pre-exposure prophylaxis (PrEP). You are considered at risk if:  You are a man who has sex with other men (MSM).  You are a heterosexual man who is sexually active with multiple partners.  You take drugs by injection.  You are sexually active with a partner who has HIV.  Talk with your health care provider about whether you are at high risk of being infected with HIV. If you choose to begin PrEP, you should first be tested for HIV. You should then be tested every 3 months for as long as you are taking PrEP.  Use sunscreen. Apply sunscreen liberally and repeatedly throughout the day. You should seek shade when your shadow is shorter than you. Protect yourself by wearing long sleeves, pants, a wide-brimmed hat, and sunglasses year round whenever you are outdoors.  Tell your health care provider of new moles or changes in moles, especially if there is a change in shape or color. Also, tell your health care provider if a mole is larger than the size of a pencil eraser.  A one-time screening for abdominal aortic aneurysm (AAA) and surgical repair of large AAAs by ultrasound is recommended for men aged 65-75 years who are current or former smokers.  Stay current with your vaccines (immunizations).   This information is not intended to replace advice given to you by your health care provider. Make sure you discuss any questions you have with your health care provider.   Document Released: 06/07/2008 Document Revised: 12/31/2014 Document Reviewed: 05/07/2011 Elsevier Interactive Patient Education 2016 Elsevier Inc.  

## 2016-07-19 NOTE — Progress Notes (Signed)
By signing my name below, I, [name], attest that this documentation has been prepared under the direction and in the presence of Arlyss Queen, MD. Electronically Signed: [name], [place]. 07/19/2016 , 8:36 AM .  Patient was seen in room 1 .  Chief Complaint:  Chief Complaint  Patient presents with  . Follow-up    6 mo follow up visit    HPI: Ricardo Garcia is a 60 y.o. male who has h/o HTN and HLD reports to [facility] today for follow up.  Patient has been dealing with sinus issue for a long time now. He's being followed by [facility] He was given allergy drops and has been using them for a year now. He's also had balloon sinuplasty with some relief. He's also been trying bromelain extract from pineapple for 4-5 months after researching on the internet with some relief.   Patient also had right ankle arthrotomy, done by Dr. Rhona Raider, in Nov 2016. He states he's doing well but his ankle is still "a little stiff".   Past Medical History:  Diagnosis Date  . Chemotherapeutic agent or infusion extravasation   . Hyperlipidemia   . Hypertension   . Seizures ([place])    Past Surgical History:  Procedure Laterality Date  . ARTHROTOMY Right 11/15/2015   Procedure: RIGHT ANKLE ARTHROTOMY ;  Surgeon: Melrose Nakayama, MD;  Location: [place];  Service: Orthopedics;  Laterality: Right;  . FOREIGN BODY REMOVAL Right 11/15/2015   Procedure: RIGHT ANKLE LOOSE BODY REMOVAL;  Surgeon: Melrose Nakayama, MD;  Location: [place];  Service: Orthopedics;  Laterality: Right;  . HERNIA REPAIR     Social History   Social History  . Marital status: Divorced    Spouse name: N/A  . Number of children: N/A  . Years of education: N/A   Social History Main Topics  . Smoking status: Never Smoker  . Smokeless tobacco: Never Used  . Alcohol use 4.2 oz/week    7 Standard drinks or equivalent per week     Comment: daily  . Drug use: No  . Sexual activity: Yes   Partners: Female   Other Topics Concern  . None   Social History Narrative  . None   Family History  Problem Relation Age of Onset  . Hypertension Mother   . Stroke Mother    No Known Allergies Prior to Admission medications   Medication Sig Start Date End Date Taking? Authorizing Provider  amoxicillin-clavulanate (AUGMENTIN) 875-125 MG tablet Take 1 tablet by mouth 2 (two) times daily. 04/01/16   Leandrew Koyanagi, MD  aspirin 81 MG tablet Take 81 mg by mouth daily.     Historical Provider, MD  cloNIDine (CATAPRES) 0.1 MG tablet TAKE ONE TABLET BY MOUTH TWICE DAILY. 08/15/15   Chelle Jeffery, PA-C  halobetasol (ULTRAVATE) 0.05 % cream APPLY TOPICALLY 2 (TWO) TIMES DAILY. Patient not taking: Reported on 04/01/2016 05/30/15   Darlyne Russian, MD  lisinopril (PRINIVIL,ZESTRIL) 10 MG tablet TAKE ONE TABLET BY MOUTH ONCE DAILY 02/16/16   Darlyne Russian, MD  montelukast (SINGULAIR) 10 MG tablet Take 1 tablet (10 mg total) by mouth at bedtime. Patient not taking: Reported on 11/10/2015 03/03/15   Shawnee Knapp, MD  simvastatin (ZOCOR) 40 MG tablet TAKE ONE TABLET BY MOUTH ONCE DAILY IN THE MORNING 08/15/15   Chelle Jeffery, PA-C     ROS:  Constitutional: negative for fever, chills, night sweats, weight changes, or fatigue  HEENT: negative for vision changes, hearing loss, congestion, rhinorrhea, ST, epistaxis, or sinus pressure Cardiovascular: negative for chest pain or palpitations Respiratory: negative for hemoptysis, wheezing, shortness of breath, or cough Abdominal: negative for abdominal pain, nausea, vomiting, diarrhea, or constipation Dermatological: negative for rash Neurologic: negative for headache, dizziness, or syncope All other systems reviewed and are otherwise negative with the exception to those above and in the HPI.  PHYSICAL EXAM: Vitals:   07/19/16 0817  BP: 114/80  Pulse: 75  Temp: 98.3 F (36.8 C)   Body mass index is 31.16 kg/m.   General: Alert, no acute  distress HEENT:  Normocephalic, atraumatic, oropharynx patent. Eye: Juliette Mangle [facility] Cardiovascular:  Regular rate and rhythm, no rubs murmurs or gallops.  No Carotid bruits, radial pulse intact. No pedal edema.  Respiratory: Clear to auscultation bilaterally.  No wheezes, rales, or rhonchi.  No cyanosis, no use of accessory musculature Abdominal: No organomegaly, abdomen is soft and non-tender, positive bowel sounds. No masses. Musculoskeletal: Gait intact. No edema, tenderness Skin: No rashes. Neurologic: Facial musculature symmetric. Psychiatric: Patient acts appropriately throughout our interaction.  Lymphatic: No cervical or submandibular lymphadenopathy Genitourinary/Anorectal: No acute findings  LABS:   EKG/XRAY:     ASSESSMENT/PLAN: Patient is doing well. He is happy with his current job situation. Things at home are excellent. No change in medication at the present time.He has 2 physicians he will probably follow-up with Dr. Shelia Media or Dr. Yong Channel. These are both excellent physicians and I encouraged him to go ahead and make an appointment.I personally performed the services described in this documentation, which was scribed in my presence. The recorded information has been reviewed and is accurate.  Gross sideeffects, risk and benefits, and alternatives of medications d/w patient. Patient is aware that all medications have potential sideeffects and we are unable to predict every sideeffect or drug-drug interaction that may occur.  Arlyss Queen MD 07/19/2016 8:21 AM

## 2016-07-20 LAB — PSA: PSA: 0.93 ng/mL (ref <=4.00)

## 2016-08-09 ENCOUNTER — Encounter: Payer: Self-pay | Admitting: Emergency Medicine

## 2016-08-10 ENCOUNTER — Telehealth: Payer: Self-pay

## 2016-08-10 MED ORDER — SIMVASTATIN 40 MG PO TABS: ORAL_TABLET | ORAL | 1 refills | Status: DC

## 2016-08-10 MED ORDER — CLONIDINE HCL 0.1 MG PO TABS: ORAL_TABLET | ORAL | 1 refills | Status: DC

## 2016-08-10 MED ORDER — LISINOPRIL 10 MG PO TABS: 10.0000 mg | ORAL_TABLET | Freq: Every day | ORAL | 1 refills | Status: DC

## 2016-08-10 NOTE — Telephone Encounter (Signed)
Pt needs to have the rx that was called in recently to [facility] called into [facility] [address] [place] is the wrong pharmacy

## 2016-08-13 MED ORDER — CLONIDINE HCL 0.1 MG PO TABS: ORAL_TABLET | ORAL | 1 refills | Status: DC

## 2016-08-13 MED ORDER — LISINOPRIL 10 MG PO TABS: 10.0000 mg | ORAL_TABLET | Freq: Every day | ORAL | 1 refills | Status: DC

## 2016-08-13 MED ORDER — SIMVASTATIN 40 MG PO TABS: ORAL_TABLET | ORAL | 1 refills | Status: DC

## 2016-12-11 DIAGNOSIS — R918 Other nonspecific abnormal finding of lung field: Secondary | ICD-10-CM | POA: Insufficient documentation

## 2016-12-11 DIAGNOSIS — R042 Hemoptysis: Secondary | ICD-10-CM | POA: Insufficient documentation

## 2016-12-20 HISTORY — PX: ELECTROMAGNETIC NAVIGATION BROCHOSCOPY: SHX5369

## 2017-01-21 DIAGNOSIS — Z23 Encounter for immunization: Secondary | ICD-10-CM | POA: Diagnosis not present

## 2017-01-24 DIAGNOSIS — R918 Other nonspecific abnormal finding of lung field: Secondary | ICD-10-CM | POA: Diagnosis not present

## 2017-01-24 DIAGNOSIS — R911 Solitary pulmonary nodule: Secondary | ICD-10-CM | POA: Diagnosis not present

## 2017-01-24 DIAGNOSIS — R042 Hemoptysis: Secondary | ICD-10-CM | POA: Diagnosis not present

## 2017-02-07 ENCOUNTER — Encounter: Payer: Self-pay | Admitting: Thoracic Surgery (Cardiothoracic Vascular Surgery)

## 2017-02-07 ENCOUNTER — Encounter: Payer: Self-pay | Admitting: *Deleted

## 2017-02-07 ENCOUNTER — Institutional Professional Consult (permissible substitution) (INDEPENDENT_AMBULATORY_CARE_PROVIDER_SITE_OTHER): Payer: 59 | Admitting: Thoracic Surgery (Cardiothoracic Vascular Surgery)

## 2017-02-07 VITALS — BP 150/88 | HR 82 | Resp 16 | Ht 74.0 in | Wt 235.0 lb

## 2017-02-07 DIAGNOSIS — R918 Other nonspecific abnormal finding of lung field: Secondary | ICD-10-CM | POA: Diagnosis not present

## 2017-02-07 NOTE — Progress Notes (Signed)
PCP is DAUB, STEVE A, MD Referring Provider is Daub, Omario Ander A, MD  Chief Complaint  Patient presents with  . Lung Lesion    and pulmonary infiltrates...CT CHEST 12/11/16, PET 2/1/118...has had BRONCH/EBUS...all neg results    HPI: Ricardo Garcia is a 60-year-old gentle man sent for consultation regarding pulmonary infiltrates and lung nodules.  Ricardo Garcia is a 60-year-old gentleman with history of severe allergies chronic sinus issues, a right pneumothorax at age 19, hypertension, and hyperlipidemia. He is a lifelong nonsmoker. About 3 months ago he began having hemoptysis in the mornings. Because of his history of sinus issues he thought initially that he was just having nosebleeds. He was also having cough and some wheezing at that time. He did not have fevers or chills or night sweats. He saw his ENT and his sinuses were okay. He was referred to Dr. Bregman. A chest x-ray and CT were done. On CT he had 2 small right lung nodules and the several smaller nodules in the left lung. He had basilar infiltrates bilaterally right greater than left. He was treated with Levaquin. His wheezing improved and the hemoptysis decreased but never went away. Bronchoscopy and biopsies were performed. Cultures came back negative pathology showed benign lung tissue with reactive changes was nondiagnostic. An extensive workup for vasculitis and collagen vascular diseases was negative.  He denies any weight loss although his appetite is not as good as it was. He continues to cough up small amounts of blood in the morning along with some white sputum. He again denies fevers chills or sweats. He denies any shortness of breath and his exercise tolerance is as good as it ever has been. He denies any chest pain, pressure or tightness. Zubrod Score: At the time of surgery this patient's most appropriate activity status/level should be described as: [x]    0    Normal activity, no symptoms []    1    Restricted in physical strenuous  activity but ambulatory, able to do out light work []    2    Ambulatory and capable of self care, unable to do work activities, up and about >50 % of waking hours                              []    3    Only limited self care, in bed greater than 50% of waking hours []    4    Completely disabled, no self care, confined to bed or chair []    5    Moribund   Past Medical History:  Diagnosis Date  . Chemotherapeutic agent or infusion extravasation   . Hyperlipidemia   . Hypertension   . Seizures ([facility])   Spontaneous pneumothorax 40 years ago  Past Surgical History:  Procedure Laterality Date  .  bone spur removal    . ARTHROTOMY Right 11/15/2015   Procedure: RIGHT ANKLE ARTHROTOMY ;  Surgeon: Peter Dalldorf, MD;  [facility];  Service: Orthopedics;  Laterality: Right;  . BRONCHOSCOPY  12/20/2016  . ELECTROMAGNETIC NAVIGATION BROCHOSCOPY  12/20/2016  . FOREIGN BODY REMOVAL Right 11/15/2015   Procedure: RIGHT ANKLE LOOSE BODY REMOVAL;  Surgeon: Peter Dalldorf, MD;  Location: [facility];  Service: Orthopedics;  Laterality: Right;  . HERNIA REPAIR    Right chest tube for spontaneous pneumothorax  Family History  Problem Relation Age of Onset  . Hypertension Mother   . Stroke   Mother     Social History Social History  Substance Use Topics  . Smoking status: Never Smoker  . Smokeless tobacco: Never Used  . Alcohol use 4.2 oz/week    7 Standard drinks or equivalent per week     Comment: daily    Current Outpatient Prescriptions  Medication Sig Dispense Refill  . aspirin 81 MG tablet Take 81 mg by mouth daily.     . Bromelains 500 MG TABS Take 1 tablet by mouth 2 (two) times daily.    . cloNIDine (CATAPRES) 0.1 MG tablet TAKE ONE TABLET BY MOUTH TWICE DAILY. 180 tablet 1  . lisinopril (PRINIVIL,ZESTRIL) 10 MG tablet Take 1 tablet (10 mg total) by mouth daily. 90 tablet 1  . montelukast (SINGULAIR) 10 MG tablet Take 1 tablet (10 mg total) by mouth at bedtime. 30 tablet 5  .  simvastatin (ZOCOR) 40 MG tablet TAKE ONE TABLET BY MOUTH ONCE DAILY IN THE MORNING 90 tablet 1   No current facility-administered medications for this visit.     Allergies  Allergen Reactions  . Levaquin [Levofloxacin] Other (See Comments)    MYALGIAS...NOTED AFTER TREATMENT 10/2016    Review of Systems  Constitutional: Positive for appetite change. Negative for activity change, chills, fatigue, fever and unexpected weight change.  HENT: Negative for trouble swallowing and voice change.        History of sinus disease with no current issues  Respiratory: Positive for cough (Hemoptysis) and wheezing (Resolved). Negative for chest tightness and shortness of breath.   Cardiovascular: Negative for chest pain and leg swelling.  Gastrointestinal: Negative for abdominal pain and blood in stool.  Genitourinary: Negative for difficulty urinating and dysuria.  Musculoskeletal: Negative for arthralgias and myalgias.  Neurological: Negative for seizures and syncope.  Hematological: Negative for adenopathy. Does not bruise/bleed easily.  All other systems reviewed and are negative.   BP (!) 150/88 (BP Location: Left Arm, Patient Position: Sitting, Cuff Size: Large)   Pulse 82   Resp 16   Ht 6' 2" (1.88 m)   Wt 235 lb (106.6 kg)   SpO2 95% Comment: ON RA  BMI 30.17 kg/m  Physical Exam  Constitutional: He is oriented to person, place, and time. He appears well-developed and well-nourished. No distress.  HENT:  Head: Normocephalic and atraumatic.  Mouth/Throat: No oropharyngeal exudate.  Eyes: EOM are normal. Pupils are equal, round, and reactive to light. No scleral icterus.  Neck: Neck supple. No thyromegaly present.  Cardiovascular: Normal rate, regular rhythm, normal heart sounds and intact distal pulses.  Exam reveals no gallop and no friction rub.   No murmur heard. Pulmonary/Chest: Effort normal. He has no wheezes. Rales: Faint crackles right base.  Faint crackles right base,  otherwise clear  Abdominal: Soft. He exhibits no distension. There is no tenderness.  Musculoskeletal: He exhibits no edema or deformity.  Lymphadenopathy:    He has no cervical adenopathy.  Neurological: He is alert and oriented to person, place, and time. No cranial nerve deficit. He exhibits normal muscle tone. Coordination normal.  Skin: Skin is warm and dry.  Vitals reviewed.    Diagnostic Tests: I personally reviewed the CT and PET/CT that he brought from [facility] on disc. I concur with the findings on the official reports  Official report on CT Impression inflammatory or infectious infiltrate in the bilateral lower lobes right greater than left. Well-circumscribed 8 mm right lower lobe nodule may be benign although follow-up PET scan would be useful for further   characterization. There also is a 6 mm right middle lobe nodule.  Official report of PET/CT Impression: 1. Multiple bilateral pulmonary nodules, none of which are hypermetabolic, however majority are below PET resolution 2. Similar infectious or inflammatory opacities in the right greater than left lower lobes.  Impression: 60-year-old man with a 3 month history of hemoptysis who has bilateral pulmonary infiltrates right greater than left and also multiple small pulmonary nodules. I suspect that these are 2 separate and distinct processes. The nodules appeared very well-circumscribed and were not hypermetabolic on PET. The could represent small carcinoid tumors or benign hamartomas. There is a very small possibility that could be malignant, but I think this is highly unlikely. The inflammatory process is more difficult to characterize. He's had an extensive workup which has not been revealing he's also had cultures done which were negative. I think the next step would be to do a lung biopsy to get additional tissue for pathologic examination and cultures. This would of course not be therapeutic but diagnostic only.  I  recommended to Ricardo Garcia that we do a right VATS for lung biopsy and wedge resection of the small nodules. I informed him of the general nature of the procedure, the need for general anesthesia, the incisions to be used, the use of a drainage tube postoperatively, expected hospital stay, and the overall recovery. I informed her the indications, risks, benefits, and alternatives. He understands the risk include but are not limited to death, MI, DVT, PE, bleeding, possible need for transfusion, infection, air leak, irregular heart rhythms, as well as possibility of other unforeseeable complications.  He accepts the risk and wish to proceed. He does want to wait until mid March to have the procedure done due to some work commitments over the next several weeks.  Plan: Right VATS for lung biopsy and wedge resection on Thursday, 03/07/2017.  Ricardo Garcia C Ricardo Burby, MD [facility] [phone] 

## 2017-02-08 ENCOUNTER — Other Ambulatory Visit: Payer: Self-pay | Admitting: *Deleted

## 2017-02-08 DIAGNOSIS — R918 Other nonspecific abnormal finding of lung field: Secondary | ICD-10-CM

## 2017-02-10 ENCOUNTER — Other Ambulatory Visit: Payer: Self-pay | Admitting: Emergency Medicine

## 2017-02-11 DIAGNOSIS — Z23 Encounter for immunization: Secondary | ICD-10-CM | POA: Diagnosis not present

## 2017-02-25 ENCOUNTER — Telehealth: Payer: Self-pay | Admitting: General Practice

## 2017-02-25 MED ORDER — SIMVASTATIN 40 MG PO TABS: 40.0000 mg | ORAL_TABLET | Freq: Every day | ORAL | 1 refills | Status: DC

## 2017-02-25 MED ORDER — LISINOPRIL 10 MG PO TABS: 10.0000 mg | ORAL_TABLET | Freq: Every day | ORAL | 1 refills | Status: DC

## 2017-02-25 MED ORDER — CLONIDINE HCL 0.1 MG PO TABS: 0.1000 mg | ORAL_TABLET | Freq: Two times a day (BID) | ORAL | 1 refills | Status: DC

## 2017-02-25 NOTE — Telephone Encounter (Signed)
Pt calling prev daub pt and has a new pcp at [facility] but is not able to see them for first time until April he is out of his meds and wants to see if we will fill them until he can est care at new office   He would like simvastatin, lisinopril, and clonidine filled just to make it to his appt in April    Please advise ID:[id number]

## 2017-03-04 NOTE — Pre-Procedure Instructions (Signed)
Ricardo Garcia  03/04/2017      [id number] - [place], [place] [address] [place] [address] Phone: [phone] Fax: [phone]  [place], [place] - [address] [place] [address] Phone: [phone] Fax: [phone]  [place] [address] Phone: [phone] Fax: [phone]    Your procedure is scheduled on March 15  Report to [facility] at 0800 A.M.  Call this number if you have problems the morning of surgery:  [phone]   Remember:  Do not eat food or drink liquids after midnight.   Take these medicines the morning of surgery with A SIP OF WATER cloNIDine (CATAPRES),   7 days prior to surgery STOP taking any Aspirin, Aleve, Naproxen, Ibuprofen, Motrin, Advil, Goody's, BC's, all herbal medications, fish oil, and all vitamins    Do not wear jewelry.  Do not wear lotions, powders, or cologne, or deoderant.  Men may shave face and neck.  Do not bring valuables to the hospital.  [facility] is not responsible for any belongings or valuables.  Contacts, dentures or bridgework may not be worn into surgery.  Leave your suitcase in the car.  After surgery it may be brought to your room.  For patients admitted to the hospital, discharge time will be determined by your treatment team.  Patients discharged the day of surgery will not be allowed to drive home.    Special instructions:   [facility]- Preparing For Surgery  Before surgery, you can play an important role. Because skin is not sterile, your skin needs to be as free of germs as possible. You can reduce the number of germs on your skin by washing with CHG (chlorahexidine gluconate) Soap before surgery.  CHG is an antiseptic cleaner which kills germs and bonds with the skin to continue  killing germs even after washing.  Please do not use if you have an allergy to CHG or antibacterial soaps. If your skin becomes reddened/irritated stop using the CHG.  Do not shave (including legs and underarms) for at least 48 hours prior to first CHG shower. It is OK to shave your face.  Please follow these instructions carefully.   1. Shower the NIGHT BEFORE SURGERY and the MORNING OF SURGERY with CHG.   2. If you chose to wash your hair, wash your hair first as usual with your normal shampoo.  3. After you shampoo, rinse your hair and body thoroughly to remove the shampoo.  4. Use CHG as you would any other liquid soap. You can apply CHG directly to the skin and wash gently with a scrungie or a clean washcloth.   5. Apply the CHG Soap to your body ONLY FROM THE NECK DOWN.  Do not use on open wounds or open sores. Avoid contact with your eyes, ears, mouth and genitals (private parts). Wash genitals (private parts) with your normal soap.  6. Wash thoroughly, paying special attention to the area where your surgery will be performed.  7. Thoroughly rinse your body with warm water from the neck down.  8. DO NOT shower/wash with your normal soap after using and rinsing off the CHG Soap.  9. Pat yourself dry with a CLEAN TOWEL.   10. Wear CLEAN PAJAMAS   11. Place CLEAN SHEETS on your bed the  night of your first shower and DO NOT SLEEP WITH PETS.    Day of Surgery: Do not apply any deodorants/lotions. Please wear clean clothes to the hospital/surgery center.      Please read over the following fact sheets that you were given.

## 2017-03-05 ENCOUNTER — Encounter (HOSPITAL_COMMUNITY)
Admission: RE | Admit: 2017-03-05 | Discharge: 2017-03-05 | Disposition: A | Discharge status: Home / Self Care | Payer: 59 | Source: Ambulatory Visit | Attending: Thoracic Surgery (Cardiothoracic Vascular Surgery) | Admitting: Thoracic Surgery (Cardiothoracic Vascular Surgery)

## 2017-03-05 ENCOUNTER — Ambulatory Visit (HOSPITAL_COMMUNITY)
Admission: RE | Admit: 2017-03-05 | Discharge: 2017-03-05 | Disposition: A | Discharge status: Home / Self Care | Payer: 59 | Source: Ambulatory Visit | Attending: Thoracic Surgery (Cardiothoracic Vascular Surgery) | Admitting: Thoracic Surgery (Cardiothoracic Vascular Surgery)

## 2017-03-05 ENCOUNTER — Encounter (HOSPITAL_COMMUNITY): Payer: Self-pay

## 2017-03-05 DIAGNOSIS — Z0183 Encounter for blood typing: Secondary | ICD-10-CM | POA: Insufficient documentation

## 2017-03-05 DIAGNOSIS — I10 Essential (primary) hypertension: Secondary | ICD-10-CM | POA: Insufficient documentation

## 2017-03-05 DIAGNOSIS — J9811 Atelectasis: Secondary | ICD-10-CM | POA: Diagnosis not present

## 2017-03-05 DIAGNOSIS — Z01812 Encounter for preprocedural laboratory examination: Secondary | ICD-10-CM

## 2017-03-05 DIAGNOSIS — R918 Other nonspecific abnormal finding of lung field: Secondary | ICD-10-CM | POA: Insufficient documentation

## 2017-03-05 DIAGNOSIS — R042 Hemoptysis: Secondary | ICD-10-CM | POA: Diagnosis not present

## 2017-03-05 DIAGNOSIS — Z01818 Encounter for other preprocedural examination: Secondary | ICD-10-CM | POA: Insufficient documentation

## 2017-03-05 DIAGNOSIS — J849 Interstitial pulmonary disease, unspecified: Secondary | ICD-10-CM | POA: Diagnosis not present

## 2017-03-05 HISTORY — DX: Other specified postprocedural states: R11.2

## 2017-03-05 HISTORY — DX: Other specified postprocedural states: Z98.890

## 2017-03-05 LAB — COMPREHENSIVE METABOLIC PANEL
ALK PHOS: 46 U/L (ref 38–126)
ALT: 20 U/L (ref 17–63)
ANION GAP: 12 (ref 5–15)
AST: 21 U/L (ref 15–41)
Albumin: 4.1 g/dL (ref 3.5–5.0)
BILIRUBIN TOTAL: 0.8 mg/dL (ref 0.3–1.2)
BUN: 11 mg/dL (ref 6–20)
CALCIUM: 9.6 mg/dL (ref 8.9–10.3)
CO2: 22 mmol/L (ref 22–32)
Chloride: 102 mmol/L (ref 101–111)
Creatinine, Ser: 1 mg/dL (ref 0.61–1.24)
Glucose, Bld: 107 mg/dL — ABNORMAL HIGH (ref 65–99)
Potassium: 4 mmol/L (ref 3.5–5.1)
Sodium: 136 mmol/L (ref 135–145)
TOTAL PROTEIN: 7.1 g/dL (ref 6.5–8.1)

## 2017-03-05 LAB — TYPE AND SCREEN
ABO/RH(D): O POS
Antibody Screen: NEGATIVE

## 2017-03-05 LAB — URINALYSIS, ROUTINE W REFLEX MICROSCOPIC
Bilirubin Urine: NEGATIVE
GLUCOSE, UA: NEGATIVE mg/dL
HGB URINE DIPSTICK: NEGATIVE
Ketones, ur: NEGATIVE mg/dL
Leukocytes, UA: NEGATIVE
Nitrite: NEGATIVE
Protein, ur: NEGATIVE mg/dL
SPECIFIC GRAVITY, URINE: 1.005 (ref 1.005–1.030)
pH: 5 (ref 5.0–8.0)

## 2017-03-05 LAB — CBC
HEMATOCRIT: 44.2 % (ref 39.0–52.0)
HEMOGLOBIN: 14.9 g/dL (ref 13.0–17.0)
MCH: 32 pg (ref 26.0–34.0)
MCHC: 33.7 g/dL (ref 30.0–36.0)
MCV: 94.8 fL (ref 78.0–100.0)
Platelets: 192 10*3/uL (ref 150–400)
RBC: 4.66 MIL/uL (ref 4.22–5.81)
RDW: 13.1 % (ref 11.5–15.5)
WBC: 7 10*3/uL (ref 4.0–10.5)

## 2017-03-05 LAB — BLOOD GAS, ARTERIAL
Acid-Base Excess: 0.3 mmol/L (ref 0.0–2.0)
BICARBONATE: 23.7 mmol/L (ref 20.0–28.0)
Drawn by: 449841
FIO2: 21
O2 Saturation: 96 %
PATIENT TEMPERATURE: 98.6
PH ART: 7.464 — AB (ref 7.350–7.450)
PO2 ART: 82.3 mmHg — AB (ref 83.0–108.0)
pCO2 arterial: 33.4 mmHg (ref 32.0–48.0)

## 2017-03-05 LAB — PROTIME-INR
INR: 0.98
Prothrombin Time: 13 seconds (ref 11.4–15.2)

## 2017-03-05 LAB — SURGICAL PCR SCREEN
MRSA, PCR: NEGATIVE
Staphylococcus aureus: NEGATIVE

## 2017-03-05 LAB — ABO/RH: ABO/RH(D): O POS

## 2017-03-05 LAB — APTT: aPTT: 23 seconds — ABNORMAL LOW (ref 24–36)

## 2017-03-05 NOTE — Progress Notes (Addendum)
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 Patient expressed that there were no further questions.  Patient was also instructed that they will need to review over the PAT instructions again at home before the surgery.

## 2017-03-07 ENCOUNTER — Inpatient Hospital Stay (HOSPITAL_COMMUNITY): Payer: 59 | Admitting: Anesthesiology

## 2017-03-07 ENCOUNTER — Encounter (HOSPITAL_COMMUNITY): Payer: Self-pay | Admitting: Urology

## 2017-03-07 ENCOUNTER — Inpatient Hospital Stay (HOSPITAL_COMMUNITY)
Admission: RE | Admit: 2017-03-07 | Discharge: 2017-03-10 | DRG: 167 | Disposition: A | Discharge status: Home / Self Care | Payer: 59 | Source: Ambulatory Visit | Attending: Thoracic Surgery (Cardiothoracic Vascular Surgery) | Admitting: Thoracic Surgery (Cardiothoracic Vascular Surgery)

## 2017-03-07 ENCOUNTER — Inpatient Hospital Stay (HOSPITAL_COMMUNITY): Payer: 59

## 2017-03-07 ENCOUNTER — Encounter (HOSPITAL_COMMUNITY)
Admission: RE | Disposition: A | Discharge status: Home / Self Care | Payer: Self-pay | Source: Ambulatory Visit | Attending: Thoracic Surgery (Cardiothoracic Vascular Surgery)

## 2017-03-07 DIAGNOSIS — I10 Essential (primary) hypertension: Secondary | ICD-10-CM | POA: Diagnosis present

## 2017-03-07 DIAGNOSIS — Z938 Other artificial opening status: Secondary | ICD-10-CM

## 2017-03-07 DIAGNOSIS — Z7982 Long term (current) use of aspirin: Secondary | ICD-10-CM

## 2017-03-07 DIAGNOSIS — R918 Other nonspecific abnormal finding of lung field: Secondary | ICD-10-CM

## 2017-03-07 DIAGNOSIS — Z79899 Other long term (current) drug therapy: Secondary | ICD-10-CM | POA: Diagnosis not present

## 2017-03-07 DIAGNOSIS — Z9889 Other specified postprocedural states: Secondary | ICD-10-CM

## 2017-03-07 DIAGNOSIS — S0500XA Injury of conjunctiva and corneal abrasion without foreign body, unspecified eye, initial encounter: Secondary | ICD-10-CM | POA: Diagnosis not present

## 2017-03-07 DIAGNOSIS — T8859XA Other complications of anesthesia, initial encounter: Secondary | ICD-10-CM | POA: Diagnosis not present

## 2017-03-07 DIAGNOSIS — J9811 Atelectasis: Secondary | ICD-10-CM | POA: Diagnosis not present

## 2017-03-07 DIAGNOSIS — R911 Solitary pulmonary nodule: Secondary | ICD-10-CM | POA: Diagnosis not present

## 2017-03-07 DIAGNOSIS — R042 Hemoptysis: Secondary | ICD-10-CM | POA: Diagnosis not present

## 2017-03-07 DIAGNOSIS — Z4682 Encounter for fitting and adjustment of non-vascular catheter: Secondary | ICD-10-CM | POA: Diagnosis not present

## 2017-03-07 DIAGNOSIS — Z09 Encounter for follow-up examination after completed treatment for conditions other than malignant neoplasm: Secondary | ICD-10-CM

## 2017-03-07 DIAGNOSIS — E785 Hyperlipidemia, unspecified: Secondary | ICD-10-CM | POA: Diagnosis present

## 2017-03-07 DIAGNOSIS — J849 Interstitial pulmonary disease, unspecified: Secondary | ICD-10-CM | POA: Diagnosis not present

## 2017-03-07 DIAGNOSIS — Z881 Allergy status to other antibiotic agents status: Secondary | ICD-10-CM

## 2017-03-07 HISTORY — PX: SEGMENTECOMY: SHX5076

## 2017-03-07 HISTORY — PX: VIDEO ASSISTED THORACOSCOPY (VATS)/WEDGE RESECTION: SHX6174

## 2017-03-07 HISTORY — PX: LYMPH NODE DISSECTION: SHX5087

## 2017-03-07 HISTORY — PX: LUNG BIOPSY: SHX5088

## 2017-03-07 SURGERY — VIDEO ASSISTED THORACOSCOPY (VATS)/WEDGE RESECTION
Anesthesia: General | Site: Chest | Laterality: Right

## 2017-03-07 MED ORDER — DEXTROSE 5 % IV SOLN
1.5000 g | Freq: Two times a day (BID) | INTRAVENOUS | Status: AC
  Administered 2017-03-07 – 2017-03-08 (×2): 1.5 g via INTRAVENOUS
  Filled 2017-03-07 (×2): qty 1.5

## 2017-03-07 MED ORDER — FENTANYL CITRATE (PF) 100 MCG/2ML IJ SOLN
INTRAMUSCULAR | Status: AC
  Filled 2017-03-07: qty 4

## 2017-03-07 MED ORDER — FENTANYL CITRATE (PF) 100 MCG/2ML IJ SOLN
INTRAMUSCULAR | Status: DC | PRN
  Administered 2017-03-07: 50 ug via INTRAVENOUS
  Administered 2017-03-07: 200 ug via INTRAVENOUS
  Administered 2017-03-07 (×2): 50 ug via INTRAVENOUS

## 2017-03-07 MED ORDER — BISACODYL 5 MG PO TBEC
10.0000 mg | DELAYED_RELEASE_TABLET | Freq: Every day | ORAL | Status: DC
  Administered 2017-03-07 – 2017-03-09 (×3): 10 mg via ORAL
  Filled 2017-03-07 (×4): qty 2

## 2017-03-07 MED ORDER — LACTATED RINGERS IV SOLN
INTRAVENOUS | Status: DC
  Administered 2017-03-07 (×2): via INTRAVENOUS

## 2017-03-07 MED ORDER — ACETAMINOPHEN 160 MG/5ML PO SOLN
1000.0000 mg | Freq: Four times a day (QID) | ORAL | Status: DC
  Filled 2017-03-07: qty 40.6

## 2017-03-07 MED ORDER — MONTELUKAST SODIUM 10 MG PO TABS
10.0000 mg | ORAL_TABLET | Freq: Every day | ORAL | Status: DC
  Administered 2017-03-08 – 2017-03-09 (×2): 10 mg via ORAL
  Filled 2017-03-07 (×3): qty 1

## 2017-03-07 MED ORDER — PROPOFOL 10 MG/ML IV BOLUS
INTRAVENOUS | Status: DC | PRN
  Administered 2017-03-07: 140 mg via INTRAVENOUS

## 2017-03-07 MED ORDER — DIPHENHYDRAMINE HCL 12.5 MG/5ML PO ELIX: 12.5000 mg | ORAL_SOLUTION | Freq: Four times a day (QID) | ORAL | Status: DC | PRN

## 2017-03-07 MED ORDER — BUPIVACAINE 0.5 % ON-Q PUMP SINGLE CATH 400 ML
400.0000 mL | INJECTION | Status: AC
  Administered 2017-03-07: 400 mL
  Filled 2017-03-07: qty 400

## 2017-03-07 MED ORDER — MIDAZOLAM HCL 2 MG/2ML IJ SOLN
INTRAMUSCULAR | Status: AC
  Filled 2017-03-07: qty 2

## 2017-03-07 MED ORDER — SODIUM CHLORIDE 0.9 % IV SOLN
30.0000 meq | Freq: Every day | INTRAVENOUS | Status: DC | PRN
  Filled 2017-03-07: qty 15

## 2017-03-07 MED ORDER — NALOXONE HCL 0.4 MG/ML IJ SOLN: 0.4000 mg | INTRAMUSCULAR | Status: DC | PRN

## 2017-03-07 MED ORDER — DEXTROSE-NACL 5-0.9 % IV SOLN
INTRAVENOUS | Status: DC
  Administered 2017-03-07 – 2017-03-08 (×2): via INTRAVENOUS

## 2017-03-07 MED ORDER — KETOROLAC TROMETHAMINE 30 MG/ML IJ SOLN
30.0000 mg | Freq: Four times a day (QID) | INTRAMUSCULAR | Status: DC
  Administered 2017-03-07 – 2017-03-08 (×3): 30 mg via INTRAVENOUS
  Filled 2017-03-07 (×3): qty 1

## 2017-03-07 MED ORDER — HYDROMORPHONE HCL 1 MG/ML IJ SOLN
INTRAMUSCULAR | Status: AC
  Filled 2017-03-07: qty 0.5

## 2017-03-07 MED ORDER — ALBUTEROL SULFATE (2.5 MG/3ML) 0.083% IN NEBU
2.5000 mg | INHALATION_SOLUTION | RESPIRATORY_TRACT | Status: DC
  Administered 2017-03-07 – 2017-03-08 (×3): 2.5 mg via RESPIRATORY_TRACT
  Filled 2017-03-07 (×3): qty 3

## 2017-03-07 MED ORDER — SUGAMMADEX SODIUM 200 MG/2ML IV SOLN
INTRAVENOUS | Status: DC | PRN
  Administered 2017-03-07: 200 mg via INTRAVENOUS

## 2017-03-07 MED ORDER — BUPIVACAINE HCL (PF) 0.5 % IJ SOLN
INTRAMUSCULAR | Status: AC
  Filled 2017-03-07: qty 10

## 2017-03-07 MED ORDER — ONDANSETRON HCL 4 MG/2ML IJ SOLN: 4.0000 mg | Freq: Four times a day (QID) | INTRAMUSCULAR | Status: DC | PRN

## 2017-03-07 MED ORDER — LACTATED RINGERS IV SOLN
INTRAVENOUS | Status: DC | PRN
  Administered 2017-03-07: 10:00:00 via INTRAVENOUS

## 2017-03-07 MED ORDER — SUCCINYLCHOLINE CHLORIDE 200 MG/10ML IV SOSY
PREFILLED_SYRINGE | INTRAVENOUS | Status: AC
  Filled 2017-03-07: qty 10

## 2017-03-07 MED ORDER — HYDROMORPHONE HCL 1 MG/ML IJ SOLN
INTRAMUSCULAR | Status: AC
  Administered 2017-03-07: 0.5 mg via INTRAVENOUS
  Filled 2017-03-07: qty 1

## 2017-03-07 MED ORDER — EPHEDRINE 5 MG/ML INJ
INTRAVENOUS | Status: AC
  Filled 2017-03-07: qty 10

## 2017-03-07 MED ORDER — CLONIDINE HCL 0.1 MG PO TABS
0.1000 mg | ORAL_TABLET | Freq: Two times a day (BID) | ORAL | Status: DC
  Administered 2017-03-08 – 2017-03-10 (×5): 0.1 mg via ORAL
  Filled 2017-03-07 (×6): qty 1

## 2017-03-07 MED ORDER — CEFUROXIME SODIUM 1.5 G IJ SOLR
INTRAMUSCULAR | Status: AC
  Filled 2017-03-07: qty 1.5

## 2017-03-07 MED ORDER — ROCURONIUM BROMIDE 50 MG/5ML IV SOSY
PREFILLED_SYRINGE | INTRAVENOUS | Status: AC
  Filled 2017-03-07: qty 20

## 2017-03-07 MED ORDER — ONDANSETRON HCL 4 MG/2ML IJ SOLN
INTRAMUSCULAR | Status: AC
  Filled 2017-03-07: qty 2

## 2017-03-07 MED ORDER — ACETAMINOPHEN 500 MG PO TABS
1000.0000 mg | ORAL_TABLET | Freq: Four times a day (QID) | ORAL | Status: DC
  Administered 2017-03-07 – 2017-03-10 (×11): 1000 mg via ORAL
  Filled 2017-03-07 (×10): qty 2

## 2017-03-07 MED ORDER — BUPIVACAINE HCL (PF) 0.5 % IJ SOLN
INTRAMUSCULAR | Status: DC | PRN
  Administered 2017-03-07: 10 mL

## 2017-03-07 MED ORDER — 0.9 % SODIUM CHLORIDE (POUR BTL) OPTIME
TOPICAL | Status: DC | PRN
  Administered 2017-03-07: 1000 mL

## 2017-03-07 MED ORDER — SIMVASTATIN 40 MG PO TABS
40.0000 mg | ORAL_TABLET | Freq: Every day | ORAL | Status: DC
  Administered 2017-03-08 – 2017-03-10 (×3): 40 mg via ORAL
  Filled 2017-03-07 (×3): qty 1

## 2017-03-07 MED ORDER — LISINOPRIL 10 MG PO TABS
10.0000 mg | ORAL_TABLET | Freq: Every day | ORAL | Status: DC
  Administered 2017-03-08 – 2017-03-10 (×3): 10 mg via ORAL
  Filled 2017-03-07 (×3): qty 1

## 2017-03-07 MED ORDER — TRAMADOL HCL 50 MG PO TABS
50.0000 mg | ORAL_TABLET | Freq: Four times a day (QID) | ORAL | Status: DC | PRN
  Administered 2017-03-08 – 2017-03-09 (×3): 100 mg via ORAL
  Filled 2017-03-07 (×3): qty 2

## 2017-03-07 MED ORDER — OXYCODONE HCL 5 MG PO TABS
5.0000 mg | ORAL_TABLET | ORAL | Status: DC | PRN
  Administered 2017-03-08 – 2017-03-09 (×3): 10 mg via ORAL
  Filled 2017-03-07 (×2): qty 2
  Filled 2017-03-07 (×2): qty 1
  Filled 2017-03-07: qty 2

## 2017-03-07 MED ORDER — KETOROLAC TROMETHAMINE 0.5 % OP SOLN
1.0000 [drp] | Freq: Three times a day (TID) | OPHTHALMIC | Status: DC | PRN
  Administered 2017-03-07: 1 [drp] via OPHTHALMIC
  Filled 2017-03-07 (×2): qty 3

## 2017-03-07 MED ORDER — ONDANSETRON HCL 4 MG/2ML IJ SOLN
INTRAMUSCULAR | Status: DC | PRN
  Administered 2017-03-07: 4 mg via INTRAVENOUS

## 2017-03-07 MED ORDER — BSS IO SOLN
15.0000 mL | Freq: Once | INTRAOCULAR | Status: AC
  Administered 2017-03-07: 15 mL
  Filled 2017-03-07 (×2): qty 15

## 2017-03-07 MED ORDER — ROCURONIUM BROMIDE 100 MG/10ML IV SOLN
INTRAVENOUS | Status: DC | PRN
Start: 2017-03-07 — End: 2017-03-07
  Administered 2017-03-07 (×2): 10 mg via INTRAVENOUS
  Administered 2017-03-07: 20 mg via INTRAVENOUS
  Administered 2017-03-07 (×2): 10 mg via INTRAVENOUS
  Administered 2017-03-07: 50 mg via INTRAVENOUS

## 2017-03-07 MED ORDER — ORAL CARE MOUTH RINSE
15.0000 mL | Freq: Two times a day (BID) | OROMUCOSAL | Status: DC
  Administered 2017-03-07 – 2017-03-10 (×4): 15 mL via OROMUCOSAL

## 2017-03-07 MED ORDER — MIDAZOLAM HCL 5 MG/5ML IJ SOLN
INTRAMUSCULAR | Status: DC | PRN
Start: 2017-03-07 — End: 2017-03-07
  Administered 2017-03-07: 2 mg via INTRAVENOUS

## 2017-03-07 MED ORDER — ZOLPIDEM TARTRATE 5 MG PO TABS
5.0000 mg | ORAL_TABLET | Freq: Every evening | ORAL | Status: DC | PRN
Start: 2017-03-07 — End: 2017-03-10
  Administered 2017-03-07 – 2017-03-09 (×3): 5 mg via ORAL
  Filled 2017-03-07 (×4): qty 1

## 2017-03-07 MED ORDER — SENNOSIDES-DOCUSATE SODIUM 8.6-50 MG PO TABS
1.0000 | ORAL_TABLET | Freq: Every day | ORAL | Status: DC
  Administered 2017-03-07 – 2017-03-09 (×3): 1 via ORAL
  Filled 2017-03-07 (×4): qty 1

## 2017-03-07 MED ORDER — SODIUM CHLORIDE 0.9% FLUSH: 9.0000 mL | INTRAVENOUS | Status: DC | PRN

## 2017-03-07 MED ORDER — HYDROMORPHONE HCL 1 MG/ML IJ SOLN
0.2500 mg | INTRAMUSCULAR | Status: DC | PRN
  Administered 2017-03-07 (×3): 0.5 mg via INTRAVENOUS

## 2017-03-07 MED ORDER — DEXTROSE 5 % IV SOLN
1.5000 g | INTRAVENOUS | Status: AC
  Administered 2017-03-07: 1.5 g via INTRAVENOUS

## 2017-03-07 MED ORDER — PHENYLEPHRINE HCL 10 MG/ML IJ SOLN
INTRAVENOUS | Status: DC | PRN
  Administered 2017-03-07: 40 ug/min via INTRAVENOUS

## 2017-03-07 MED ORDER — FENTANYL CITRATE (PF) 100 MCG/2ML IJ SOLN
INTRAMUSCULAR | Status: AC
  Filled 2017-03-07: qty 2

## 2017-03-07 MED ORDER — FENTANYL 40 MCG/ML IV SOLN
INTRAVENOUS | Status: DC
  Administered 2017-03-07: 1000 ug via INTRAVENOUS
  Administered 2017-03-07: 165 ug via INTRAVENOUS
  Administered 2017-03-08: 15 ug via INTRAVENOUS
  Administered 2017-03-08: 45 ug via INTRAVENOUS
  Administered 2017-03-08: 80 ug via INTRAVENOUS
  Administered 2017-03-08: 90 ug via INTRAVENOUS
  Administered 2017-03-08: 60 ug via INTRAVENOUS
  Administered 2017-03-08: 105 ug via INTRAVENOUS
  Administered 2017-03-09: 75 ug via INTRAVENOUS
  Administered 2017-03-09: 30 ug via INTRAVENOUS
  Filled 2017-03-07 (×3): qty 25

## 2017-03-07 MED ORDER — DIPHENHYDRAMINE HCL 50 MG/ML IJ SOLN
12.5000 mg | Freq: Four times a day (QID) | INTRAMUSCULAR | Status: DC | PRN
  Administered 2017-03-08: 12.5 mg via INTRAVENOUS
  Filled 2017-03-07: qty 1

## 2017-03-07 MED ORDER — POLYMYXIN B-TRIMETHOPRIM 10000-0.1 UNIT/ML-% OP SOLN
1.0000 [drp] | Freq: Three times a day (TID) | OPHTHALMIC | Status: DC
  Filled 2017-03-07: qty 10

## 2017-03-07 MED ORDER — ASPIRIN 81 MG PO CHEW
81.0000 mg | CHEWABLE_TABLET | Freq: Every day | ORAL | Status: DC
  Administered 2017-03-08 – 2017-03-09 (×2): 81 mg via ORAL
  Filled 2017-03-07 (×3): qty 1

## 2017-03-07 MED ORDER — HEMOSTATIC AGENTS (NO CHARGE) OPTIME
TOPICAL | Status: DC | PRN
  Administered 2017-03-07: 1 via TOPICAL

## 2017-03-07 MED ORDER — PROPOFOL 10 MG/ML IV BOLUS
INTRAVENOUS | Status: AC
  Filled 2017-03-07: qty 20

## 2017-03-07 SURGICAL SUPPLY — 99 items
BENZOIN TINCTURE PRP APPL 2/3 (GAUZE/BANDAGES/DRESSINGS) ×2 IMPLANT
CANISTER SUCT 3000ML PPV (MISCELLANEOUS) ×4 IMPLANT
CATH KIT ON Q 5IN SLV (PAIN MANAGEMENT) IMPLANT
CATH KIT ON-Q SILVERSOAK 5IN (CATHETERS) ×2 IMPLANT
CATH THORACIC 28FR (CATHETERS) ×2 IMPLANT
CATH THORACIC 36FR (CATHETERS) IMPLANT
CATH THORACIC 36FR RT ANG (CATHETERS) IMPLANT
CLIP TI MEDIUM 6 (CLIP) ×4 IMPLANT
CLSR STERI-STRIP ANTIMIC 1/2X4 (GAUZE/BANDAGES/DRESSINGS) ×2 IMPLANT
CONN ST 1/4X3/8  BEN (MISCELLANEOUS)
CONN ST 1/4X3/8 BEN (MISCELLANEOUS) IMPLANT
CONN Y 3/8X3/8X3/8  BEN (MISCELLANEOUS)
CONN Y 3/8X3/8X3/8 BEN (MISCELLANEOUS) IMPLANT
CONT SPEC 4OZ CLIKSEAL STRL BL (MISCELLANEOUS) ×16 IMPLANT
COVER SURGICAL LIGHT HANDLE (MISCELLANEOUS) IMPLANT
DECANTER SPIKE VIAL GLASS SM (MISCELLANEOUS) ×2 IMPLANT
DERMABOND ADHESIVE PROPEN (GAUZE/BANDAGES/DRESSINGS) ×1
DERMABOND ADVANCED (GAUZE/BANDAGES/DRESSINGS)
DERMABOND ADVANCED .7 DNX12 (GAUZE/BANDAGES/DRESSINGS) IMPLANT
DERMABOND ADVANCED .7 DNX6 (GAUZE/BANDAGES/DRESSINGS) ×1 IMPLANT
DRAIN CHANNEL 28F RND 3/8 FF (WOUND CARE) IMPLANT
DRAIN CHANNEL 32F RND 10.7 FF (WOUND CARE) IMPLANT
DRAPE LAPAROSCOPIC ABDOMINAL (DRAPES) ×2 IMPLANT
DRAPE WARM FLUID 44X44 (DRAPE) IMPLANT
DRSG TEGADERM 4X4.75 (GAUZE/BANDAGES/DRESSINGS) ×2 IMPLANT
ELECT BLADE 6.5 EXT (BLADE) ×2 IMPLANT
ELECT REM PT RETURN 9FT ADLT (ELECTROSURGICAL) ×2
ELECTRODE REM PT RTRN 9FT ADLT (ELECTROSURGICAL) ×1 IMPLANT
GAUZE SPONGE 4X4 12PLY STRL (GAUZE/BANDAGES/DRESSINGS) ×2 IMPLANT
GAUZE SPONGE 4X4 12PLY STRL LF (GAUZE/BANDAGES/DRESSINGS) ×2 IMPLANT
GLOVE BIO SURGEON STRL SZ7 (GLOVE) ×4 IMPLANT
GLOVE BIOGEL PI IND STRL 7.0 (GLOVE) ×2 IMPLANT
GLOVE BIOGEL PI INDICATOR 7.0 (GLOVE) ×2
GLOVE SURG SIGNA 7.5 PF LTX (GLOVE) ×4 IMPLANT
GOWN STRL REUS W/ TWL LRG LVL3 (GOWN DISPOSABLE) ×2 IMPLANT
GOWN STRL REUS W/ TWL XL LVL3 (GOWN DISPOSABLE) ×2 IMPLANT
GOWN STRL REUS W/TWL LRG LVL3 (GOWN DISPOSABLE) ×2
GOWN STRL REUS W/TWL XL LVL3 (GOWN DISPOSABLE) ×2
HANDLE STAPLE ENDO GIA SHORT (STAPLE)
HEMOSTAT SURGICEL 2X14 (HEMOSTASIS) ×2 IMPLANT
KIT BASIN OR (CUSTOM PROCEDURE TRAY) ×2 IMPLANT
KIT ROOM TURNOVER OR (KITS) ×2 IMPLANT
KIT SUCTION CATH 14FR (SUCTIONS) ×2 IMPLANT
NS IRRIG 1000ML POUR BTL (IV SOLUTION) ×4 IMPLANT
PACK CHEST (CUSTOM PROCEDURE TRAY) ×2 IMPLANT
PAD ARMBOARD 7.5X6 YLW CONV (MISCELLANEOUS) ×4 IMPLANT
POUCH ENDO CATCH II 15MM (MISCELLANEOUS) IMPLANT
POUCH SPECIMEN RETRIEVAL 10MM (ENDOMECHANICALS) IMPLANT
RELOAD STAPLER GOLD 60MM (STAPLE) ×11 IMPLANT
RELOAD STAPLER GREEN 60MM (STAPLE) ×6 IMPLANT
SCISSORS ENDO CVD 5DCS (MISCELLANEOUS) IMPLANT
SEALANT PROGEL (MISCELLANEOUS) IMPLANT
SEALANT SURG COSEAL 4ML (VASCULAR PRODUCTS) IMPLANT
SEALANT SURG COSEAL 8ML (VASCULAR PRODUCTS) IMPLANT
SOLUTION ANTI FOG 6CC (MISCELLANEOUS) ×2 IMPLANT
SPECIMEN JAR MEDIUM (MISCELLANEOUS) IMPLANT
SPONGE INTESTINAL PEANUT (DISPOSABLE) ×14 IMPLANT
SPONGE TONSIL 1 RF SGL (DISPOSABLE) ×2 IMPLANT
STAPLE ECHEON FLEX 60 POW ENDO (STAPLE) ×2 IMPLANT
STAPLER ENDO GIA 12MM SHORT (STAPLE) IMPLANT
STAPLER RELOAD GOLD 60MM (STAPLE) ×22
STAPLER RELOAD GREEN 60MM (STAPLE) ×12
SUT PROLENE 4 0 RB 1 (SUTURE)
SUT PROLENE 4-0 RB1 .5 CRCL 36 (SUTURE) IMPLANT
SUT SILK  1 MH (SUTURE) ×3
SUT SILK 1 MH (SUTURE) ×3 IMPLANT
SUT SILK 1 TIES 10X30 (SUTURE) ×2 IMPLANT
SUT SILK 2 0 SH (SUTURE) IMPLANT
SUT SILK 2 0SH CR/8 30 (SUTURE) IMPLANT
SUT SILK 3 0 SH 30 (SUTURE) IMPLANT
SUT SILK 3 0SH CR/8 30 (SUTURE) IMPLANT
SUT VIC AB 0 CTX 27 (SUTURE) IMPLANT
SUT VIC AB 1 CTX 27 (SUTURE) ×2 IMPLANT
SUT VIC AB 2-0 CT1 27 (SUTURE)
SUT VIC AB 2-0 CT1 TAPERPNT 27 (SUTURE) IMPLANT
SUT VIC AB 2-0 CTX 36 (SUTURE) ×2 IMPLANT
SUT VIC AB 3-0 MH 27 (SUTURE) IMPLANT
SUT VIC AB 3-0 SH 27 (SUTURE)
SUT VIC AB 3-0 SH 27X BRD (SUTURE) IMPLANT
SUT VIC AB 3-0 X1 27 (SUTURE) ×2 IMPLANT
SUT VICRYL 0 UR6 27IN ABS (SUTURE) ×4 IMPLANT
SUT VICRYL 2 TP 1 (SUTURE) IMPLANT
SWAB COLLECTION DEVICE MRSA (MISCELLANEOUS) IMPLANT
SYR 10ML LL (SYRINGE) ×2 IMPLANT
SYSTEM SAHARA CHEST DRAIN ATS (WOUND CARE) ×2 IMPLANT
TAPE CLOTH SURG 4X10 WHT LF (GAUZE/BANDAGES/DRESSINGS) ×2 IMPLANT
TIP APPLICATOR SPRAY EXTEND 16 (VASCULAR PRODUCTS) IMPLANT
TOWEL GREEN STERILE (TOWEL DISPOSABLE) IMPLANT
TOWEL GREEN STERILE FF (TOWEL DISPOSABLE) IMPLANT
TOWEL OR 17X24 6PK STRL BLUE (TOWEL DISPOSABLE) IMPLANT
TOWEL OR 17X26 10 PK STRL BLUE (TOWEL DISPOSABLE) IMPLANT
TRAP SPECIMEN MUCOUS 40CC (MISCELLANEOUS) IMPLANT
TRAY FOLEY W/METER SILVER 16FR (SET/KITS/TRAYS/PACK) ×2 IMPLANT
TROCAR XCEL BLADELESS 5X75MML (TROCAR) ×2 IMPLANT
TROCAR XCEL NON-BLD 5MMX100MML (ENDOMECHANICALS) IMPLANT
TUBE ANAEROBIC SPECIMEN COL (MISCELLANEOUS) IMPLANT
TUBE CONNECTING 20X1/4 (TUBING) ×2 IMPLANT
TUNNELER SHEATH ON-Q 11GX8 DSP (PAIN MANAGEMENT) ×2 IMPLANT
WATER STERILE IRR 1000ML POUR (IV SOLUTION) ×2 IMPLANT

## 2017-03-07 NOTE — Progress Notes (Signed)
Patient waking up complained of left eye pain with blurred vision and a "scratchy" feeling. Left eye is noticeably more swollen and red than right eye. Dr  Ola Spurr notified and initiated cornea abrasion protocol.

## 2017-03-07 NOTE — Interval H&P Note (Signed)
History and Physical Interval Note:  03/07/2017 9:47 AM  Ricardo Garcia  has presented today for surgery, with the diagnosis of RLL INFILTRATE LUNG NODULES  The various methods of treatment have been discussed with the patient and family. After consideration of risks, benefits and other options for treatment, the patient has consented to  Procedure(s): VIDEO ASSISTED THORACOSCOPY (VATS)/WEDGE RESECTION (Right) LUNG BIOPSY (Right) as a surgical intervention .  The patient's history has been reviewed, patient examined, no change in status, stable for surgery.  I have reviewed the patient's chart and labs.  Questions were answered to the patient's satisfaction.     Melrose Nakayama

## 2017-03-07 NOTE — Transfer of Care (Signed)
Immediate Anesthesia Transfer of Care Note  Patient: Ricardo Garcia  Procedure(s) Performed: Procedure(s): RIGHT LUNG VIDEO ASSISTED THORACOSCOPY (VATS)/WEDGE RESECTION (Right) RIGHT LUNG BIOPSY (Right) LYMPH NODE DISSECTION, right lung (Right) RIGHT LOWER LOBE SUPERIOR SEGMENTECTOMY (Right)  Patient Location: PACU  Anesthesia Type:General  Level of Consciousness: awake, alert , oriented and patient cooperative  Airway & Oxygen Therapy: Patient Spontanous Breathing and Patient connected to nasal cannula oxygen  Post-op Assessment: Report given to RN, Post -op Vital signs reviewed and stable and Patient moving all extremities X 4  Post vital signs: Reviewed and stable  Last Vitals:  Vitals:   03/07/17 0848 03/07/17 1424  BP: (!) 141/75 128/77  Pulse: 67 60  Resp: 20 17  Temp: 36.8 C 36.3 C    Last Pain:  Vitals:   03/07/17 0848  TempSrc: Oral         Complications: No apparent anesthesia complications

## 2017-03-07 NOTE — Op Note (Signed)
NAME:  Ricardo Garcia, Ricardo Garcia                 ACCOUNT NO.:  [id number]  MEDICAL RECORD NO.:  [id number]  LOCATION:                                 FACILITY:  PHYSICIAN:  Revonda Standard. Roxan Hockey, M.D. DATE OF BIRTH:  DATE OF PROCEDURE:  03/07/2017 DATE OF DISCHARGE:                              OPERATIVE REPORT   PREOPERATIVE DIAGNOSES:  Interstitial lung disease and lung nodules.  POSTOPERATIVE DIAGNOSIS:  Interstitial lung disease and lung nodules.  PROCEDURE:  Right video-assisted thoracoscopy, right lower lobe superior segmentectomy, right lung biopsy, On-Q local anesthetic catheter placement.  SURGEON:  Revonda Standard. Roxan Hockey, M.D.  ASSISTANT:  John Giovanni, P.A.-C.  ANESTHESIA:  General.  FINDINGS:  Fiducial marker easily palpable in superior segment of right lower lobe, nodule itself not palpable.  After wedge resection cutting into the specimen, an enlarged but otherwise benign-appearing lymph node was noted adjacent to the marker. Superior segmentectomy completed to ensure nodule was removed. No palpable right middle lobe nodule.  Interstitial lung disease seen on frozen section.  CLINICAL NOTE:  Ricardo Garcia is a 61 year old gentleman with a 77-month history of hemoptysis.  He was evaluated by Otorhinolaryngology, and there was no sinus pathology to account for his hemoptysis. Hee was referred to Dr. Tama Gander of Pulmonology.  Chest x-ray and CT were done. CT showed basilar infiltrates bilaterally greater on the right than the left.  There were 2 small right lung nodules, one in the right middle lobe and one in the superior segment of the right lower lobe. Bronchoscopy and biopsies were performed.  Cultures were negative. Pathology showed only benign lung tissue with reactive changes.  Workup for vasculitis and collagen vascular disease was negative.  Dr. Tama Gander did place a fiducial marker at the site of the superior segmental nodule.  On PET-CT, the fiducial was directly adjacent to  the nodule. The nodule was not hypermetabolic.  The patient was advised to undergo a lung biopsy and resection of the right lower lobe nodule if possible. The indications, risks, benefits, and alternatives were discussed in detail with the patient; he understood and accepted the risks and agreed to proceed.  OPERATIVE NOTE:  Ricardo Garcia was brought to the preoperative holding area on March 07, 2017.  Anesthesia established intravenous access and placed an arterial blood pressure monitoring line.  He was taken to the operating room, anesthetized, and intubated with a double-lumen endotracheal tube.  A Foley catheter was placed.  Sequential compression devices were placed on the calves for DVT prophylaxis.  Intravenous antibiotics were administered.  He was placed in a left lateral decubitus position and the right chest was prepped and draped in usual sterile fashion.  An incision was made in the seventh intercostal space in the midaxillary line.  A 5-mm port was inserted.  The thoracoscope was advanced into the Chest. There was good isolation of the right lung.  There was no visible pathology of the parietal pleura.  There was no pleural effusion.  A 5- cm working incision was made anterolaterally in the fifth interspace, no rib spreading was performed during the procedure.  The right middle lobe was grasped and palpated.  No definite nodule  could be identified, and no biopsy was taken.  The inferior pulmonary ligament was divided with electrocautery.  A small level 9 lymph node was removed and sent for permanent pathology.  A biopsy was taken from along the inferior posterior margin of the right lower lobe with sequential firings of the Echelon 60-mm endoscopic stapler using gold cartridges.  A small piece of the specimen was sent for AFB and fungal cultures.  The remainder was sent for pathology.  Frozen section was performed to ensure that there was adequate tissue for  examination. While awaiting the frozen section results, the pleural reflection was divided at the hilum posteriorly.  The fissures were incomplete.  The fiducial marker was easily palpable in the superior segment of the right lower lobe.  No discrete palpable nodule was present.  The fiducial was known to be in close proximity to the nodule based on the PET-CT.  A wedge resection was performed incorporating the fiducial as well as a good margin of approximately 2 cm around it.  After the specimen was removed, it was incised and there was an enlarged antracotic lymph node in close proximity to the fiducial, but no other findings.  While the wedge resection incorporated the portion of the lung around the fiducial with a sufficient margin to ensure that the nodule was present in the specimen, The decision made to perform a superior segmentectomy just to be 100% sure that the nodule was removed.  The fissures were incomplete.  The pulmonary artery was identified in the major fissure. The portion of the fissure at the confluence of the major and minor fissures was divided with a stapler.  The pulmonary artery was identified, nodes around it were removed, and the lymph nodes were sent for permanent pathology.  The major fissure between the superior segment and the upper lobe was completed with sequential firings of the stapler.  The segmentectomy then was completed using green cartridges to separate the remainder of the superior segment from the basilar segments.  This was a very small portion of remaining lung.  It was sent as a separate specimen for permanent pathology.  There was good hemostasis at all the staple lines.  An On-Q local anesthetic catheter was tunneled through a separate incision and placed into a subpleural location, it was primed with 5 mL of 0.5% Marcaine.  A 28-French chest tube was placed through the original port incision and directed to the apex.  It was secured to skin  with #1 silk suture.  The thoracoscope was placed through the working incision and the right chest was filled with warm saline.  A test inflation showed no significant air leakage.  The right lung had good re-expansion of all 3 lobes.  The working incision then was closed in 3 layers in the standard fashion.  The chest tube was placed to suction.  The patient was placed back in a supine position.  He was extubated in the operating room and taken to the postanesthetic care unit in good condition.     Revonda Standard Roxan Hockey, M.D.     SCH/MEDQ  D:  03/07/2017  T:  03/07/2017  Job:  [id number]

## 2017-03-07 NOTE — H&P (View-Only) (Signed)
PCP is DAUB, Lina Sayre, MD Referring Provider is Daub, Loura Back, MD  Chief Complaint  Patient presents with  . Lung Lesion    and pulmonary infiltrates.Marland KitchenMarland KitchenCT CHEST 12/11/16, PET 2/1/118...has had BRONCH/EBUS...all neg results    HPI: Ricardo Garcia is a 61 year old gentle man sent for consultation regarding pulmonary infiltrates and lung nodules.  Ricardo Garcia is a 61 year old gentleman with history of severe allergies chronic sinus issues, a right pneumothorax at age 32, hypertension, and hyperlipidemia. He is a lifelong nonsmoker. About 3 months ago he began having hemoptysis in the mornings. Because of his history of sinus issues he thought initially that he was just having nosebleeds. He was also having cough and some wheezing at that time. He did not have fevers or chills or night sweats. He saw his ENT and his sinuses were okay. He was referred to Dr. Tama Gander. A chest x-ray and CT were done. On CT he had 2 small right lung nodules and the several smaller nodules in the left lung. He had basilar infiltrates bilaterally right greater than left. He was treated with Levaquin. His wheezing improved and the hemoptysis decreased but never went away. Bronchoscopy and biopsies were performed. Cultures came back negative pathology showed benign lung tissue with reactive changes was nondiagnostic. An extensive workup for vasculitis and collagen vascular diseases was negative.  He denies any weight loss although his appetite is not as good as it was. He continues to cough up small amounts of blood in the morning along with some white sputum. He again denies fevers chills or sweats. He denies any shortness of breath and his exercise tolerance is as good as it ever has been. He denies any chest pain, pressure or tightness. Zubrod Score: At the time of surgery this patient's most appropriate activity status/level should be described as: [x]     0    Normal activity, no symptoms []     1    Restricted in physical strenuous  activity but ambulatory, able to do out light work []     2    Ambulatory and capable of self care, unable to do work activities, up and about >50 % of waking hours                              []     3    Only limited self care, in bed greater than 50% of waking hours []     4    Completely disabled, no self care, confined to bed or chair []     5    Moribund   Past Medical History:  Diagnosis Date  . Chemotherapeutic agent or infusion extravasation   . Hyperlipidemia   . Hypertension   . Seizures ([place])   Spontaneous pneumothorax 40 years ago  Past Surgical History:  Procedure Laterality Date  .  bone spur removal    . ARTHROTOMY Right 11/15/2015   Procedure: RIGHT ANKLE ARTHROTOMY ;  Surgeon: Melrose Nakayama, MD;  Location: [facility];  Service: Orthopedics;  Laterality: Right;  . BRONCHOSCOPY  12/20/2016  . ELECTROMAGNETIC NAVIGATION BROCHOSCOPY  12/20/2016  . FOREIGN BODY REMOVAL Right 11/15/2015   Procedure: RIGHT ANKLE LOOSE BODY REMOVAL;  Surgeon: Melrose Nakayama, MD;  Location: [facility];  Service: Orthopedics;  Laterality: Right;  . HERNIA REPAIR    Right chest tube for spontaneous pneumothorax  Family History  Problem Relation Age of Onset  . Hypertension Mother   . Stroke  Mother     Social History Social History  Substance Use Topics  . Smoking status: Never Smoker  . Smokeless tobacco: Never Used  . Alcohol use 4.2 oz/week    7 Standard drinks or equivalent per week     Comment: daily    Current Outpatient Prescriptions  Medication Sig Dispense Refill  . aspirin 81 MG tablet Take 81 mg by mouth daily.     . Bromelains 500 MG TABS Take 1 tablet by mouth 2 (two) times daily.    . cloNIDine (CATAPRES) 0.1 MG tablet TAKE ONE TABLET BY MOUTH TWICE DAILY. 180 tablet 1  . lisinopril (PRINIVIL,ZESTRIL) 10 MG tablet Take 1 tablet (10 mg total) by mouth daily. 90 tablet 1  . montelukast (SINGULAIR) 10 MG tablet Take 1 tablet (10 mg total) by mouth at bedtime. 30 tablet 5  .  simvastatin (ZOCOR) 40 MG tablet TAKE ONE TABLET BY MOUTH ONCE DAILY IN THE MORNING 90 tablet 1   No current facility-administered medications for this visit.     Allergies  Allergen Reactions  . Levaquin [Levofloxacin] Other (See Comments)    MYALGIAS.Marland KitchenMarland KitchenNOTED AFTER TREATMENT 10/2016    Review of Systems  Constitutional: Positive for appetite change. Negative for activity change, chills, fatigue, fever and unexpected weight change.  HENT: Negative for trouble swallowing and voice change.        History of sinus disease with no current issues  Respiratory: Positive for cough (Hemoptysis) and wheezing (Resolved). Negative for chest tightness and shortness of breath.   Cardiovascular: Negative for chest pain and leg swelling.  Gastrointestinal: Negative for abdominal pain and blood in stool.  Genitourinary: Negative for difficulty urinating and dysuria.  Musculoskeletal: Negative for arthralgias and myalgias.  Neurological: Negative for seizures and syncope.  Hematological: Negative for adenopathy. Does not bruise/bleed easily.  All other systems reviewed and are negative.   BP (!) 150/88 (BP Location: Left Arm, Patient Position: Sitting, Cuff Size: Large)   Pulse 82   Resp 16   Ht 6\' 2"  (1.88 m)   Wt 235 lb (106.6 kg)   SpO2 95% Comment: ON RA  BMI 30.17 kg/m  Physical Exam  Constitutional: He is oriented to person, place, and time. He appears well-developed and well-nourished. No distress.  HENT:  Head: Normocephalic and atraumatic.  Mouth/Throat: No oropharyngeal exudate.  Eyes: EOM are normal. Pupils are equal, round, and reactive to light. No scleral icterus.  Neck: Neck supple. No thyromegaly present.  Cardiovascular: Normal rate, regular rhythm, normal heart sounds and intact distal pulses.  Exam reveals no gallop and no friction rub.   No murmur heard. Pulmonary/Chest: Effort normal. He has no wheezes. Rales: Faint crackles right base.  Faint crackles right base,  otherwise clear  Abdominal: Soft. He exhibits no distension. There is no tenderness.  Musculoskeletal: He exhibits no edema or deformity.  Lymphadenopathy:    He has no cervical adenopathy.  Neurological: He is alert and oriented to person, place, and time. No cranial nerve deficit. He exhibits normal muscle tone. Coordination normal.  Skin: Skin is warm and dry.  Vitals reviewed.    Diagnostic Tests: I personally reviewed the CT and PET/CT that he brought from [facility] on disc. I concur with the findings on the official reports  Official report on CT Impression inflammatory or infectious infiltrate in the bilateral lower lobes right greater than left. Well-circumscribed 8 mm right lower lobe nodule may be benign although follow-up PET scan would be useful for further  characterization. There also is a 6 mm right middle lobe nodule.  Official report of PET/CT Impression: 1. Multiple bilateral pulmonary nodules, none of which are hypermetabolic, however majority are below PET resolution 2. Similar infectious or inflammatory opacities in the right greater than left lower lobes.  Impression: 61 year old man with a 3 month history of hemoptysis who has bilateral pulmonary infiltrates right greater than left and also multiple small pulmonary nodules. I suspect that these are 2 separate and distinct processes. The nodules appeared very well-circumscribed and were not hypermetabolic on PET. The could represent small carcinoid tumors or benign hamartomas. There is a very small possibility that could be malignant, but I think this is highly unlikely. The inflammatory process is more difficult to characterize. He's had an extensive workup which has not been revealing he's also had cultures done which were negative. I think the next step would be to do a lung biopsy to get additional tissue for pathologic examination and cultures. This would of course not be therapeutic but diagnostic only.  I  recommended to Ricardo Garcia that we do a right VATS for lung biopsy and wedge resection of the small nodules. I informed him of the general nature of the procedure, the need for general anesthesia, the incisions to be used, the use of a drainage tube postoperatively, expected hospital stay, and the overall recovery. I informed her the indications, risks, benefits, and alternatives. He understands the risk include but are not limited to death, MI, DVT, PE, bleeding, possible need for transfusion, infection, air leak, irregular heart rhythms, as well as possibility of other unforeseeable complications.  He accepts the risk and wish to proceed. He does want to wait until mid March to have the procedure done due to some work commitments over the next several weeks.  Plan: Right VATS for lung biopsy and wedge resection on Thursday, 03/07/2017.  Melrose Nakayama, MD [facility] [phone]

## 2017-03-07 NOTE — Anesthesia Procedure Notes (Signed)
Procedure Name: Intubation Date/Time: 03/07/2017 10:44 AM Performed by: Carney Living Pre-anesthesia Checklist: Patient identified, Emergency Drugs available, Suction available, Patient being monitored and Timeout performed Patient Re-evaluated:Patient Re-evaluated prior to inductionOxygen Delivery Method: Circle system utilized Preoxygenation: Pre-oxygenation with 100% oxygen Intubation Type: IV induction Ventilation: Mask ventilation without difficulty and Oral airway inserted - appropriate to patient size Laryngoscope Size: Mac and 4 Grade View: Grade I Tube type: Oral Endobronchial tube: Double lumen EBT, EBT position confirmed by fiberoptic bronchoscope and Left and 41 Fr Number of attempts: 1 Airway Equipment and Method: Fiberoptic brochoscope and Stylet Placement Confirmation: ETT inserted through vocal cords under direct vision,  positive ETCO2 and breath sounds checked- equal and bilateral Secured at: 31 cm Tube secured with: Tape Dental Injury: Teeth and Oropharynx as per pre-operative assessment

## 2017-03-07 NOTE — Progress Notes (Signed)
CT surgery p.m. Rounds  Patient with postoperative discomfort after right VATS for lung biopsy Patient complaining of left eye discomfort, states he had scratch of his cornea during the procedure today. Eyedrops both medicated and saline have been ordered. Pulmonary status stable, no air leak from chest tube or significant drainage

## 2017-03-07 NOTE — Anesthesia Postprocedure Evaluation (Signed)
Anesthesia Post Note  Patient: [name]  Procedure(s) Performed: Procedure(s) (LRB): RIGHT LUNG VIDEO ASSISTED THORACOSCOPY (VATS)/WEDGE RESECTION (Right) RIGHT LUNG BIOPSY (Right) LYMPH NODE DISSECTION, right lung (Right) RIGHT LOWER LOBE SUPERIOR SEGMENTECTOMY (Right)  Patient location during evaluation: PACU Anesthesia Type: General Level of consciousness: awake and alert Pain management: pain level controlled Vital Signs Assessment: post-procedure vital signs reviewed and stable Respiratory status: spontaneous breathing, nonlabored ventilation, respiratory function stable and patient connected to nasal cannula oxygen Cardiovascular status: blood pressure returned to baseline and stable Postop Assessment: no signs of nausea or vomiting Anesthetic complications: no       Last Vitals:  Vitals:   03/07/17 1900 03/07/17 2000  BP: 116/75 119/64  Pulse: 70 67  Resp: 17 15  Temp:  36.9 C    Last Pain:  Vitals:   03/07/17 2000  TempSrc: Oral  PainSc: 3                  Deaisha Welborn,W. EDMOND

## 2017-03-07 NOTE — Anesthesia Preprocedure Evaluation (Addendum)
Anesthesia Evaluation  Patient identified by MRN, date of birth, ID band Patient awake    Reviewed: Allergy & Precautions, H&P , NPO status , Patient's Chart, lab work & pertinent test results  Airway Mallampati: II  TM Distance: >3 FB Neck ROM: Full    Dental no notable dental hx. (+) Teeth Intact, Dental Advisory Given   Pulmonary neg pulmonary ROS,    Pulmonary exam normal breath sounds clear to auscultation       Cardiovascular hypertension, Pt. on medications  Rhythm:Regular Rate:Normal     Neuro/Psych negative neurological ROS  negative psych ROS   GI/Hepatic negative GI ROS, Neg liver ROS,   Endo/Other  negative endocrine ROS  Renal/GU negative Renal ROS  negative genitourinary   Musculoskeletal   Abdominal   Peds  Hematology negative hematology ROS (+)   Anesthesia Other Findings   Reproductive/Obstetrics negative OB ROS                            Anesthesia Physical Anesthesia Plan  ASA: II  Anesthesia Plan: General   Post-op Pain Management:    Induction: Intravenous  Airway Management Planned: Double Lumen EBT  Additional Equipment: Arterial line  Intra-op Plan:   Post-operative Plan: Extubation in OR  Informed Consent: I have reviewed the patients History and Physical, chart, labs and discussed the procedure including the risks, benefits and alternatives for the proposed anesthesia with the patient or authorized representative who has indicated his/her understanding and acceptance.   Dental advisory given  Plan Discussed with: CRNA  Anesthesia Plan Comments:        Anesthesia Quick Evaluation

## 2017-03-07 NOTE — Brief Op Note (Addendum)
03/07/2017  1:55 PM  PATIENT:  Ricardo Garcia  61 y.o. male  PRE-OPERATIVE DIAGNOSIS:  RLL INFILTRATE AND LUNG NODULES  POST-OPERATIVE DIAGNOSIS:  RLL INFILTRATE AND LUNG NODULES  PROCEDURE:  Procedure(s): RIGHT LUNG VIDEO ASSISTED THORACOSCOPY (VATS)/WEDGE RESECTION (Right) RIGHT LUNG BIOPSY (Right) LYMPH NODE DISSECTION, right lung (Right) RLL superior segmentectomy  SURGEON:  Surgeon(s) and Role:    * Melrose Nakayama, MD - Primary  PHYSICIAN ASSISTANT: Jadene Pierini PA-C  ANESTHESIA:   general  EBL:  Total I/O In: 1100 [I.V.:1100] Out: 200 [Urine:200]  BLOOD ADMINISTERED:none  DRAINS: 1  Chest Tube(s) in the right hemithorax   LOCAL MEDICATIONS USED:  MARCAINE     SPECIMEN:  Source of Specimen:  RLLwedge/superior segmentectomy  DISPOSITION OF SPECIMEN:  PATHOLOGY  COUNTS:  YES  PLAN OF CARE: Admit to inpatient   PATIENT DISPOSITION:  PACU - hemodynamically stable.   Delay start of Pharmacological VTE agent (>24hrs) due to surgical blood loss or risk of bleeding: yes  Complications: no known  Large but otherwise benign appearing lymph node in superior segment adjacent to fiducial marker.  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

## 2017-03-08 ENCOUNTER — Inpatient Hospital Stay (HOSPITAL_COMMUNITY): Payer: 59

## 2017-03-08 ENCOUNTER — Encounter (HOSPITAL_COMMUNITY): Payer: Self-pay | Admitting: Thoracic Surgery (Cardiothoracic Vascular Surgery)

## 2017-03-08 LAB — BASIC METABOLIC PANEL
ANION GAP: 7 (ref 5–15)
BUN: 13 mg/dL (ref 6–20)
CALCIUM: 8.3 mg/dL — AB (ref 8.9–10.3)
CO2: 26 mmol/L (ref 22–32)
Chloride: 103 mmol/L (ref 101–111)
Creatinine, Ser: 1.28 mg/dL — ABNORMAL HIGH (ref 0.61–1.24)
GFR, EST NON AFRICAN AMERICAN: 59 mL/min — AB (ref >60)
Glucose, Bld: 131 mg/dL — ABNORMAL HIGH (ref 65–99)
Potassium: 3.9 mmol/L (ref 3.5–5.1)
SODIUM: 136 mmol/L (ref 135–145)

## 2017-03-08 LAB — CBC
HCT: 36.1 % — ABNORMAL LOW (ref 39.0–52.0)
Hemoglobin: 12.1 g/dL — ABNORMAL LOW (ref 13.0–17.0)
MCH: 32.3 pg (ref 26.0–34.0)
MCHC: 33.5 g/dL (ref 30.0–36.0)
MCV: 96.3 fL (ref 78.0–100.0)
PLATELETS: 186 10*3/uL (ref 150–400)
RBC: 3.75 MIL/uL — ABNORMAL LOW (ref 4.22–5.81)
RDW: 13.2 % (ref 11.5–15.5)
WBC: 8.1 10*3/uL (ref 4.0–10.5)

## 2017-03-08 LAB — POCT I-STAT 3, ART BLOOD GAS (G3+)
ACID-BASE EXCESS: 4 mmol/L — AB (ref 0.0–2.0)
Bicarbonate: 29.6 mmol/L — ABNORMAL HIGH (ref 20.0–28.0)
O2 SAT: 94 %
PO2 ART: 71 mmHg — AB (ref 83.0–108.0)
TCO2: 31 mmol/L (ref 0–100)
pCO2 arterial: 48.5 mmHg — ABNORMAL HIGH (ref 32.0–48.0)
pH, Arterial: 7.393 (ref 7.350–7.450)

## 2017-03-08 LAB — ACID FAST SMEAR (AFB): ACID FAST SMEAR - AFSCU2: NEGATIVE

## 2017-03-08 LAB — ACID FAST SMEAR (AFB, MYCOBACTERIA)

## 2017-03-08 MED ORDER — ALBUTEROL SULFATE (2.5 MG/3ML) 0.083% IN NEBU
2.5000 mg | INHALATION_SOLUTION | Freq: Two times a day (BID) | RESPIRATORY_TRACT | Status: DC
  Administered 2017-03-08: 2.5 mg via RESPIRATORY_TRACT
  Filled 2017-03-08 (×2): qty 3

## 2017-03-08 MED ORDER — ENOXAPARIN SODIUM 40 MG/0.4ML ~~LOC~~ SOLN
40.0000 mg | Freq: Every day | SUBCUTANEOUS | Status: DC
  Administered 2017-03-08 – 2017-03-10 (×3): 40 mg via SUBCUTANEOUS
  Filled 2017-03-08 (×3): qty 0.4

## 2017-03-08 NOTE — Progress Notes (Addendum)
1 Day Post-Op Procedure(s) (LRB): RIGHT LUNG VIDEO ASSISTED THORACOSCOPY (VATS)/WEDGE RESECTION (Right) RIGHT LUNG BIOPSY (Right) LYMPH NODE DISSECTION, right lung (Right) RIGHT LOWER LOBE SUPERIOR SEGMENTECTOMY (Right) Subjective: c/o pain from tube.  Most bothersome is eye from corneal abrasion Able to see out of eye this AM but still a little blurry  Objective: Vital signs in last 24 hours: Temp:  [97.4 F (36.3 C)-98.6 F (37 C)] 98.6 F (37 C) (03/16 0400) Pulse Rate:  [59-70] 66 (03/16 0700) Cardiac Rhythm: Normal sinus rhythm (03/16 0400) Resp:  [10-23] 19 (03/16 0700) BP: (92-141)/(46-84) 102/71 (03/16 0700) SpO2:  [90 %-99 %] 94 % (03/16 0700) Arterial Line BP: (92-151)/(47-71) 133/54 (03/16 0700) Weight:  [236 lb 5.3 oz (107.2 kg)] 236 lb 5.3 oz (107.2 kg) (03/16 0600)  Hemodynamic parameters for last 24 hours:    Intake/Output from previous day: 03/15 0701 - 03/16 0700 In: 4153.8 [P.O.:960; I.V.:3043.8; IV Piggyback:150] Out: 5056 [Urine:1005; Blood:100; Chest Tube:160] Intake/Output this shift: No intake/output data recorded.  General appearance: alert, cooperative and no distress Neurologic: intact Heart: regular rate and rhythm Lungs: diminished breath sounds bibasilar no air leak  Lab Results:  Recent Labs  03/05/17 1308 03/08/17 0353  WBC 7.0 8.1  HGB 14.9 12.1*  HCT 44.2 36.1*  PLT 192 186   BMET:  Recent Labs  03/05/17 1308 03/08/17 0353  NA 136 136  K 4.0 3.9  CL 102 103  CO2 22 26  GLUCOSE 107* 131*  BUN 11 13  CREATININE 1.00 1.28*  CALCIUM 9.6 8.3*    PT/INR:  Recent Labs  03/05/17 1308  LABPROT 13.0  INR 0.98   ABG    Component Value Date/Time   PHART 7.393 03/08/2017 0404   HCO3 29.6 (H) 03/08/2017 0404   TCO2 31 03/08/2017 0404   O2SAT 94.0 03/08/2017 0404   CBG (last 3)  No results for input(s): GLUCAP in the last 72 hours.  Assessment/Plan: S/P Procedure(s) (LRB): RIGHT LUNG VIDEO ASSISTED THORACOSCOPY  (VATS)/WEDGE RESECTION (Right) RIGHT LUNG BIOPSY (Right) LYMPH NODE DISSECTION, right lung (Right) RIGHT LOWER LOBE SUPERIOR SEGMENTECTOMY (Right) POD # 1   CV- stable, dc a line  RESP- bibasilar atelectasis- IS, flutter  No air leak- CT to water seal- possibly dc tube later today  RENAL- creatinine up to 1.28 this AM from 1.0- dc ketorolac  ENDO- CBG OK  SCD + enoxaparin for DVT prophylaxis  Mobilize  Corneal abrasion- getting eye drops  transfer to step down   LOS: 1 day    Ricardo Garcia 03/08/2017

## 2017-03-08 NOTE — Discharge Summary (Signed)
Physician Discharge Summary  Patient ID: Ricardo Garcia MRN: [id number] DOB/AGE: 62-Nov-1957 62 y.o.  Admit date: 03/07/2017 Discharge date: 03/10/2017  Admission Diagnoses: Patient Active Problem List   Diagnosis Date Noted  . ILD (interstitial lung disease) ([place]) 03/07/2017  . Hemoptysis 12/11/2016  .  PULMONARY INFILTRATES...BILATERAL LOWER LOBES 12/11/2016  . Lung nodules 12/11/2016  . Hypertension 01/26/2012  . Hyperlipidemia 01/26/2012    Discharge Diagnoses:  Active Problems:   ILD (interstitial lung disease) ([place]) Corneal abrasion as complication of anesthesia Postoperative atelectasis  Discharged Condition: good  HPI:  Ricardo Garcia is a 61 year old gentle man sent for consultation regarding pulmonary infiltrates and lung nodules.  Ricardo Garcia is a 61 year old gentleman with history of severe allergies chronic sinus issues, a right pneumothorax at age 61, hypertension, and hyperlipidemia. He is a lifelong nonsmoker. About 3 months ago he began having hemoptysis in the mornings. Because of his history of sinus issues he thought initially that he was just having nosebleeds. He was also having cough and some wheezing at that time. He did not have fevers or chills or night sweats. He saw his ENT and his sinuses were okay. He was referred to Dr. Tama Gander. A chest x-ray and CT were done. On CT he had 2 small right lung nodules and the several smaller nodules in the left lung. He had basilar infiltrates bilaterally right greater than left. He was treated with Levaquin. His wheezing improved and the hemoptysis decreased but never went away. Bronchoscopy and biopsies were performed. Cultures came back negative pathology showed benign lung tissue with reactive changes was nondiagnostic. An extensive workup for vasculitis and collagen vascular diseases was negative.  He denies any weight loss although his appetite is not as good as it was. He continues to cough up small amounts of blood in the  morning along with some white sputum. He again denies fevers chills or sweats. He denies any shortness of breath and his exercise tolerance is as good as it ever has been. He denies any chest pain, pressure or tightness.    Hospital Course:  Ricardo Garcia underwent a right VATs, right lower lobe superior segmentectomy, right lung biopsy on 03/07/2017 with Dr. Roxan Hockey. He tolerated the procedure well and was transferred to ICU. Creatinine was slightly elevated POD 1 therefore ketorolac was discontinued. We were able to change his chest tube to waterseal with no evidence of a pneumothorax. We removed the chest tube POD 2. His PCA was discontinued and his pain was well controlled with PO pain medicine. His creatinine improved after Toradol was discontinued. His pain improved after the chest tube was removed. Today he is tolerating room air, he is walking with limited assistance, his incision is healing well, and he is ready for discharge home with friends/family.   Consults: None  Significant Diagnostic Studies:   CLINICAL DATA:  ILD (interstitial lung disease) ([place]) J84.9 (ICD-10-CM), chest tube placement /tube on water seal  EXAM: PORTABLE CHEST 1 VIEW  COMPARISON:  03/08/2017 at 5:35 a.m.  FINDINGS: Right chest tube is stable with its tip near the right apex. No evidence of a pneumothorax.  Right greater than left lung base opacity is stable consistent with atelectasis. Remainder of the lungs is clear.  IMPRESSION: 1. No evidence of a pneumothorax after placing the right chest tube to water seal. 2. No change in the lung base opacities, which are most consistent with atelectasis.   Electronically Signed   By: Lajean Manes M.D.   On: 03/08/2017  12:53  Treatments:  NAME:  Aymond, Tobias                 ACCOUNT NO.:  [id number]  MEDICAL RECORD NO.:  [id number]  LOCATION:                                 FACILITY:  PHYSICIAN:  Revonda Standard. Roxan Hockey, M.D. DATE OF  BIRTH:  DATE OF PROCEDURE:  03/07/2017 DATE OF DISCHARGE:                              OPERATIVE REPORT   PREOPERATIVE DIAGNOSES:  Interstitial lung disease and lung nodules.  POSTOPERATIVE DIAGNOSIS:  Interstitial lung disease and lung nodules.  PROCEDURE:  Right video-assisted thoracoscopy, right lower lobe superior segmentectomy, right lung biopsy.  SURGEON:  Revonda Standard. Roxan Hockey, M.D.  ASSISTANT:  John Giovanni, P.A.-C.  ANESTHESIA:  General.  FINDINGS:  Fiducial marker easily palpable in superior segment of right lower lobe, nodule itself not palpable.  After wedge resection cutting into the specimen, an enlarged but otherwise benign-appearing lymph node was noted superior segmentectomy completed to ensure nodule was removed. No palpable right middle lobe nodule.  Interstitial lung disease seen on frozen section.  Discharge Exam: Blood pressure 112/61, pulse (!) 56, temperature 98.4 F (36.9 C), temperature source Oral, resp. rate 18, height 6' 1.5" (1.867 m), weight 108.3 kg (238 lb 11.2 oz), SpO2 95 %.   General appearance: alert, cooperative and no distress Heart: regular rate and rhythm, S1, S2 normal, no murmur, click, rub or gallop Lungs: clear to auscultation bilaterally Abdomen: soft, non-tender; bowel sounds normal; no masses,  no organomegaly Extremities: extremities normal, atraumatic, no cyanosis or edema Wound: clean and dry without drainage  Disposition: 01-Home or Self Care  Discharge Instructions    Discharge patient    Complete by:  As directed    Discharge disposition:  01-Home or Self Care   Discharge patient date:  03/10/2017     Allergies as of 03/10/2017      Reactions   Levaquin [levofloxacin] Other (See Comments)   MYALGIAS.Marland KitchenMarland KitchenNOTED AFTER TREATMENT 10/2016      Medication List    STOP taking these medications   Bromelains 500 MG Tabs     TAKE these medications   aspirin 81 MG tablet Take 81 mg by mouth daily.    cloNIDine 0.1 MG tablet Commonly known as:  CATAPRES Take 1 tablet (0.1 mg total) by mouth 2 (two) times daily.   lisinopril 10 MG tablet Commonly known as:  PRINIVIL,ZESTRIL Take 1 tablet (10 mg total) by mouth daily.   montelukast 10 MG tablet Commonly known as:  SINGULAIR Take 1 tablet (10 mg total) by mouth at bedtime.   oxyCODONE 5 MG immediate release tablet Commonly known as:  Oxy IR/ROXICODONE Take 1 tablet (5 mg total) by mouth every 6 (six) hours as needed for severe pain.   simvastatin 40 MG tablet Commonly known as:  ZOCOR Take 1 tablet (40 mg total) by mouth daily at 6 PM. What changed:  when to take this      Follow-up Information    Melrose Nakayama, MD Follow up.   Specialty:  Cardiothoracic Surgery Why:  Your appointment is on April 3rd at 11:30am. Please arrive at 11:00am for your chest xray at [facility] which is located on the first floor of  our building. Contact information: [address] [place] [address] [phone]        Garret Reddish, MD Follow up.   Specialty:  Family Medicine Why:  Your appointment is on 04/15/2017 at 8:45am. Please bring your medication list. Contact information: [place]  [address] [phone]        nursing appointment. Call in 1 day(s).   Why:  Please call on Monday 3/19 to schedule a suture removal appointment for next week.  Contact information: Dr. Leonarda Salon office          Signed: Elgie Collard 03/10/2017, 11:02 AM

## 2017-03-08 NOTE — Progress Notes (Signed)
      [address]       [place] [address]             [phone]      Ct placed to water seal this AM  PORTABLE CHEST 1 VIEW  COMPARISON:  03/08/2017 at 5:35 a.m.  FINDINGS: Right chest tube is stable with its tip near the right apex. No evidence of a pneumothorax.  Right greater than left lung base opacity is stable consistent with atelectasis. Remainder of the lungs is clear.  IMPRESSION: 1. No evidence of a pneumothorax after placing the right chest tube to water seal. 2. No change in the lung base opacities, which are most consistent with atelectasis.   Electronically Signed   By: Lajean Manes M.D.   On: 03/08/2017 12:53  No complaints but has a small air leak on exam. Will leave tube in on water seal  [place] C. Roxan Hockey, MD [facility] [phone]

## 2017-03-08 NOTE — Progress Notes (Signed)
      [address]       [place] [address]             [phone]      Visiting with family  BP 122/64   Pulse 71   Temp 99.3 F (37.4 C) (Oral)   Resp 20   Wt 236 lb 5.3 oz (107.2 kg)   SpO2 93%   BMI 31.18 kg/m   Stable day  Awaiting bed in 4 E  Heddy Vidana C. Roxan Hockey, MD [facility] [phone]

## 2017-03-08 NOTE — Care Management Note (Signed)
Case Management Note  Patient Details  Name: Ricardo Garcia MRN: [id number] Date of Birth: Oct 21, 1956  Subjective/Objective:   From home alone, POD 1 Vats , wedge resection, chest tube x 1, dc aline today.  He has been ambulating in hall, he has medication coverage and his PCP is Garret Reddish at [facility].  He states his friend Amy and his kids will be with him when he goes home, pta indep.  He is pretty active.  NCM will cont to follow for dc needs.                 Action/Plan:   Expected Discharge Date:                  Expected Discharge Plan:  Home/Self Care  In-House Referral:     Discharge planning Services  CM Consult  Post Acute Care Choice:    Choice offered to:     DME Arranged:    DME Agency:     HH Arranged:    HH Agency:     Status of Service:  Completed, signed off  If discussed at [employer] of Stay Meetings, dates discussed:    Additional Comments:  Zenon Mayo, RN 03/08/2017, 11:41 AM

## 2017-03-08 NOTE — Discharge Instructions (Signed)
Video-Assisted Thoracic Surgery, Care After ° °This sheet gives you information about how to care for yourself after your procedure. Your health care provider may also give you more specific instructions. If you have problems or questions, contact your health care provider. °What can I expect after the procedure? °After the procedure, it is common to have: °· Some pain and soreness in your chest. °· Pain when breathing in (inhaling) and coughing. °· Constipation. °· Fatigue. °· Difficulty sleeping. ° °Follow these instructions at home: °Preventing pneumonia °· Take deep breaths or do breathing exercises as instructed by your health care provider. Doing this helps prevent lung infection (pneumonia). °· Cough frequently. Coughing may cause discomfort, but it is important to clear mucus (phlegm) and expand your lungs. If it hurts to cough, hold a pillow against your chest or place the palms of both hands on top of the incision (use splinting) when you cough. This may help relieve discomfort. °· If you were given an incentive spirometer, use it as directed. An incentive spirometer is a tool that measures how well you are filling your lungs with each breath. °· Participate in pulmonary rehabilitation as directed by your health care provider. This is a program that combines education, exercise, and support from a team of specialists. The goal is to help you heal and get back to your normal activities as soon as possible. °Medicines °· Take over-the-counter or prescription medicines only as told by your health care provider. °· If you have pain, take pain-relieving medicine before your pain becomes severe. This is important because if your pain is under control, you will be able to breathe and cough more comfortably. °· If you were prescribed an antibiotic medicine, take it as told by your health care provider. Do not stop taking the antibiotic even if you start to feel better. °Activity °· Ask your health care provider  what activities are safe for you. °· Avoid activities that use your chest muscles for at least 3-4 weeks. °· Do not lift anything that is heavier than 10 lb (4.5 kg), or the limit that your health care provider tells you, until he or she says that it is safe. °Incision care °· Follow instructions from your health care provider about how to take care of your incision(s). Make sure you: °? Wash your hands with soap and water before you change your bandage (dressing). If soap and water are not available, use hand sanitizer. °? Change your dressing as told by your health care provider. °? Leave stitches (sutures), skin glue, or adhesive strips in place. These skin closures may need to stay in place for 2 weeks or longer. If adhesive strip edges start to loosen and curl up, you may trim the loose edges. Do not remove adhesive strips completely unless your health care provider tells you to do that. °· Keep your dressing dry until it has been removed. °· Check your incision area every day for signs of infection. Check for: °? Redness, swelling, or pain. °? Fluid or blood. °? Warmth. °? Pus or a bad smell. °Bathing °· Do not take baths, swim, or use a hot tub until your health care provider approves. You may take showers. °· After your dressing has been removed, use soap and water to gently wash your incision area. Do not use anything else to clean your incision(s) unless your health care provider tells you to do this. °Driving °· Do not drive until your health care provider approves. °· Do not drive   or use heavy machinery while taking prescription pain medicine. °Eating and drinking °· Eat a healthy, balanced diet as instructed by your health care provider. A healthy diet includes plenty of fresh fruits and vegetables, whole grains, and low-fat (lean) proteins. °· Limit foods that are high in fat and processed sugars, such as fried and sweet foods. °· Drink enough fluid to keep your urine clear or pale yellow. °General  instructions °· To prevent or treat constipation while you are taking prescription pain medicine, your health care provider may recommend that you: °? Take over-the-counter or prescription medicines. °? Eat foods that are high in fiber, such as beans, fresh fruits and vegetables, and whole grains. °· Do not use any products that contain nicotine or tobacco, such as cigarettes and e-cigarettes. If you need help quitting, ask your health care provider. °· Avoid secondhand smoke. °· Wear compression stockings as told by your health care provider. These stockings help to prevent blood clots and reduce swelling in your legs. °· If you have a chest tube, care for it as instructed by your health care provider. Do not travel by airplane during the 2 weeks after your chest tube is removed, or until your health care provider says that this is safe. °· Keep all follow-up visits as told by your health care provider. This is important. °Contact a health care provider if: °· You have redness, swelling, or pain around an incision. °· You have fluid or blood coming from an incision. °· Your incision area feels warm to the touch. °· You have pus or a bad smell coming from an incision. °· You have a fever or chills. °· You have nausea or vomiting. °· You have pain that does not get better with medicine. °Get help right away if: °· You have chest pain. °· Your heart is fluttering or beating rapidly. °· You develop a rash. °· You have shortness of breath or trouble breathing. °· You are confused. °· You have trouble speaking. °· You feel weak, light-headed, or dizzy. °· You faint. °Summary °· To help prevent lung infection (pneumonia), take deep breaths or do breathing exercises as instructed by your health care provider. °· Cough frequently to clear mucus (phlegm) and expand your lungs. If it hurts to cough, hold a pillow against your chest or place the palms of both hands on top of the incision (use splinting) when you cough. °· If  you have pain, take pain-relieving medicine before your pain becomes severe. This is important because if your pain is under control, you will be able to breathe and cough more comfortably. °· Ask your health care provider what activities are safe for you. °This information is not intended to replace advice given to you by your health care provider. Make sure you discuss any questions you have with your health care provider. °Document Released: 04/06/2013 Document Revised: 11/19/2016 Document Reviewed: 11/19/2016 °Elsevier Interactive Patient Education © 2017 [employer] ° °

## 2017-03-09 ENCOUNTER — Inpatient Hospital Stay (HOSPITAL_COMMUNITY): Payer: 59

## 2017-03-09 LAB — COMPREHENSIVE METABOLIC PANEL
ALT: 16 U/L — AB (ref 17–63)
ANION GAP: 7 (ref 5–15)
AST: 25 U/L (ref 15–41)
Albumin: 2.8 g/dL — ABNORMAL LOW (ref 3.5–5.0)
Alkaline Phosphatase: 37 U/L — ABNORMAL LOW (ref 38–126)
BUN: 11 mg/dL (ref 6–20)
CALCIUM: 8.6 mg/dL — AB (ref 8.9–10.3)
CO2: 27 mmol/L (ref 22–32)
Chloride: 103 mmol/L (ref 101–111)
Creatinine, Ser: 1.11 mg/dL (ref 0.61–1.24)
GFR calc Af Amer: >60 mL/min (ref >60)
GFR calc non Af Amer: >60 mL/min (ref >60)
GLUCOSE: 109 mg/dL — AB (ref 65–99)
Potassium: 4.1 mmol/L (ref 3.5–5.1)
Sodium: 137 mmol/L (ref 135–145)
Total Bilirubin: 0.8 mg/dL (ref 0.3–1.2)
Total Protein: 5.4 g/dL — ABNORMAL LOW (ref 6.5–8.1)

## 2017-03-09 LAB — CBC
HCT: 36.9 % — ABNORMAL LOW (ref 39.0–52.0)
Hemoglobin: 11.8 g/dL — ABNORMAL LOW (ref 13.0–17.0)
MCH: 31.3 pg (ref 26.0–34.0)
MCHC: 32 g/dL (ref 30.0–36.0)
MCV: 97.9 fL (ref 78.0–100.0)
Platelets: 173 10*3/uL (ref 150–400)
RBC: 3.77 MIL/uL — AB (ref 4.22–5.81)
RDW: 13.3 % (ref 11.5–15.5)
WBC: 9.5 10*3/uL (ref 4.0–10.5)

## 2017-03-09 LAB — SJOGRENS SYNDROME-B EXTRACTABLE NUCLEAR ANTIBODY: SSB (La) (ENA) Antibody, IgG: <0.2 AI (ref 0.0–0.9)

## 2017-03-09 LAB — SJOGRENS SYNDROME-A EXTRACTABLE NUCLEAR ANTIBODY: SSA (Ro) (ENA) Antibody, IgG: <0.2 AI (ref 0.0–0.9)

## 2017-03-09 MED ORDER — DIPHENHYDRAMINE HCL 50 MG/ML IJ SOLN
INTRAMUSCULAR | Status: AC
  Filled 2017-03-09: qty 1

## 2017-03-09 MED ORDER — ALBUTEROL SULFATE (2.5 MG/3ML) 0.083% IN NEBU: 2.5000 mg | INHALATION_SOLUTION | RESPIRATORY_TRACT | Status: DC | PRN

## 2017-03-09 MED ORDER — DIPHENHYDRAMINE HCL 50 MG/ML IJ SOLN
12.5000 mg | Freq: Four times a day (QID) | INTRAMUSCULAR | Status: DC | PRN
  Administered 2017-03-09: 12.5 mg via INTRAVENOUS
  Filled 2017-03-09: qty 1

## 2017-03-09 NOTE — Progress Notes (Signed)
O2 at [facility] while ambulating O2 sat 97%.  O2 sat on RA = 88%.

## 2017-03-09 NOTE — Progress Notes (Signed)
Patient transferred to 2w37 via wheelchair on monitor and O2. Receiving RN at bedside.

## 2017-03-09 NOTE — Progress Notes (Signed)
      [address]       [place] [address]             [phone]      Still awaiting bed on step down  Pain much better after tube removed  BP 130/66   Pulse 70   Temp 99.5 F (37.5 C) (Oral)   Resp 19   Wt 236 lb 5.3 oz (107.2 kg)   SpO2 96%   BMI 31.18 kg/m    Intake/Output Summary (Last 24 hours) at 03/09/17 1712 Last data filed at 03/09/17 1300  [place] per 24 hour  Intake              760 ml  Output              463 ml  Net              297 ml   Still on a little O2.   Revonda Standard Roxan Hockey, MD [facility] [phone]

## 2017-03-09 NOTE — Progress Notes (Signed)
2 Days Post-Op Procedure(s) (LRB): RIGHT LUNG VIDEO ASSISTED THORACOSCOPY (VATS)/WEDGE RESECTION (Right) RIGHT LUNG BIOPSY (Right) LYMPH NODE DISSECTION, right lung (Right) RIGHT LOWER LOBE SUPERIOR SEGMENTECTOMY (Right) Subjective: c/o pain from CT  Objective: Vital signs in last 24 hours: Temp:  [98.3 F (36.8 C)-100.1 F (37.8 C)] 98.3 F (36.8 C) (03/16 2315) Pulse Rate:  [54-74] 63 (03/17 0700) Cardiac Rhythm: Normal sinus rhythm (03/17 0400) Resp:  [11-21] 16 (03/17 0700) BP: (94-137)/(45-73) 110/65 (03/17 0700) SpO2:  [89 %-95 %] 94 % (03/17 0700)  Hemodynamic parameters for last 24 hours:    Intake/Output from previous day: 03/16 0701 - 03/17 0700 In: 933.9 [P.O.:600; I.V.:333.9] Out: 1105 [Urine:675; Chest Tube:430] Intake/Output this shift: No intake/output data recorded.  General appearance: alert, cooperative and no distress Neurologic: intact Heart: regular rate and rhythm Lungs: diminished breath sounds bibasilar Abdomen: normal findings: soft, non-tender Wound: clean and dry no air leak  Lab Results:  Recent Labs  03/08/17 0353 03/09/17 0304  WBC 8.1 9.5  HGB 12.1* 11.8*  HCT 36.1* 36.9*  PLT 186 173   BMET:  Recent Labs  03/08/17 0353 03/09/17 0304  NA 136 137  K 3.9 4.1  CL 103 103  CO2 26 27  GLUCOSE 131* 109*  BUN 13 11  CREATININE 1.28* 1.11  CALCIUM 8.3* 8.6*    PT/INR: No results for input(s): LABPROT, INR in the last 72 hours. ABG    Component Value Date/Time   PHART 7.393 03/08/2017 0404   HCO3 29.6 (H) 03/08/2017 0404   TCO2 31 03/08/2017 0404   O2SAT 94.0 03/08/2017 0404   CBG (last 3)  No results for input(s): GLUCAP in the last 72 hours.  Assessment/Plan: S/P Procedure(s) (LRB): RIGHT LUNG VIDEO ASSISTED THORACOSCOPY (VATS)/WEDGE RESECTION (Right) RIGHT LUNG BIOPSY (Right) LYMPH NODE DISSECTION, right lung (Right) RIGHT LOWER LOBE SUPERIOR SEGMENTECTOMY (Right) Plan for transfer to step-down: see transfer  orders  POD # 2 stable overall No air leak - will dc chest tube  Pain control- should be better with tube out, dc PCA, use PO meds Renal- creatinine improved after toradol dc'ed Ambulate SCD + enoxaparin   LOS: 2 days    Melrose Nakayama 03/09/2017

## 2017-03-10 ENCOUNTER — Inpatient Hospital Stay (HOSPITAL_COMMUNITY): Payer: 59

## 2017-03-10 MED ORDER — OXYCODONE HCL 5 MG PO TABS: 5.0000 mg | ORAL_TABLET | Freq: Four times a day (QID) | ORAL | 0 refills | Status: DC | PRN

## 2017-03-10 NOTE — Progress Notes (Signed)
Discharged to home with family office visits in place teaching done  

## 2017-03-10 NOTE — Progress Notes (Addendum)
      [address]       [place] [address]             [phone]      3 Days Post-Op Procedure(s) (LRB): RIGHT LUNG VIDEO ASSISTED THORACOSCOPY (VATS)/WEDGE RESECTION (Right) RIGHT LUNG BIOPSY (Right) LYMPH NODE DISSECTION, right lung (Right) RIGHT LOWER LOBE SUPERIOR SEGMENTECTOMY (Right) Subjective: Feels good this morning. Pain is well controlled with his medication regimen  Objective: Vital signs in last 24 hours: Temp:  [98.1 F (36.7 C)-99.5 F (37.5 C)] 98.4 F (36.9 C) (03/18 0543) Pulse Rate:  [56-78] 56 (03/18 0543) Cardiac Rhythm: Normal sinus rhythm (03/18 0803) Resp:  [13-24] 18 (03/18 0543) BP: (112-137)/(61-70) 112/61 (03/18 0543) SpO2:  [92 %-99 %] 95 % (03/18 0543) Weight:  [107.2 kg (236 lb 5.3 oz)-108.3 kg (238 lb 11.2 oz)] 108.3 kg (238 lb 11.2 oz) (03/17 2141)     Intake/Output from previous day: 03/17 0701 - 03/18 0700 In: 610 [P.O.:600; I.V.:10] Out: 280 [Urine:280] Intake/Output this shift: No intake/output data recorded.  General appearance: alert, cooperative and no distress Heart: regular rate and rhythm, S1, S2 normal, no murmur, click, rub or gallop Lungs: clear to auscultation bilaterally Abdomen: soft, non-tender; bowel sounds normal; no masses,  no organomegaly Extremities: extremities normal, atraumatic, no cyanosis or edema Wound: clean and dry without drainage  Lab Results:  Recent Labs  03/08/17 0353 03/09/17 0304  WBC 8.1 9.5  HGB 12.1* 11.8*  HCT 36.1* 36.9*  PLT 186 173   BMET:  Recent Labs  03/08/17 0353 03/09/17 0304  NA 136 137  K 3.9 4.1  CL 103 103  CO2 26 27  GLUCOSE 131* 109*  BUN 13 11  CREATININE 1.28* 1.11  CALCIUM 8.3* 8.6*    PT/INR: No results for input(s): LABPROT, INR in the last 72 hours. ABG    Component Value Date/Time   PHART 7.393 03/08/2017 0404   HCO3 29.6 (H) 03/08/2017 0404   TCO2 31 03/08/2017 0404   O2SAT 94.0 03/08/2017 0404   CBG (last 3)  No results  for input(s): GLUCAP in the last 72 hours.  Assessment/Plan: S/P Procedure(s) (LRB): RIGHT LUNG VIDEO ASSISTED THORACOSCOPY (VATS)/WEDGE RESECTION (Right) RIGHT LUNG BIOPSY (Right) LYMPH NODE DISSECTION, right lung (Right) RIGHT LOWER LOBE SUPERIOR SEGMENTECTOMY (Right)  1. CV-NSR in the 60s, BP stable 2. Pulm-Tolerating room air without issue. CXR from this morning showed: Bibasilar atelectasis and pleural fluid with mild improvement. Mildly increased right lateral subcutaneous emphysema. Possible small loculated pneumothorax at the medial right lung base. 3. Renal- creatinine 1.11 this morning. Down from yesterday. Electrolytes okay. Weight stable 4. H and H stable. Platelets stable  Plan: Probably home today. His friend is going to stay the night and then he has several friends/family that can look in on him.      LOS: 3 days    Elgie Collard 03/10/2017 Patient seen and examined, agree with above Home today Path pending  Revonda Standard. Roxan Hockey, MD [facility] [phone]

## 2017-03-10 NOTE — Care Management Note (Signed)
Case Management Note Original Note Created by Tomi Bamberger 03/08/17  Patient Details  Name: LYNDEN FLEMMER MRN: [id number] Date of Birth: 1956-12-17  Subjective/Objective:   From home alone, POD 1 Vats , wedge resection, chest tube x 1, dc aline today.  He has been ambulating in hall, he has medication coverage and his PCP is Garret Reddish at [facility].  He states his friend Amy and his kids will be with him when he goes home, pta indep.  He is pretty active.  NCM will cont to follow for dc needs.                 Action/Plan:   Expected Discharge Date:  03/10/17               Expected Discharge Plan:  Home/Self Care  In-House Referral:     Discharge planning Services  CM Consult  Post Acute Care Choice:    Choice offered to:     DME Arranged:    DME Agency:     HH Arranged:    HH Agency:     Status of Service:  Completed, signed off  If discussed at [employer] of Stay Meetings, dates discussed:    Additional Comments: Pt to discharge home today - pt stated he did not have any barriers to discharge, has active relationship with PCP and can obtain medicaitons as prescribed. Maryclare Labrador, RN 03/10/2017, 11:23 AM

## 2017-03-15 ENCOUNTER — Ambulatory Visit (INDEPENDENT_AMBULATORY_CARE_PROVIDER_SITE_OTHER): Payer: Self-pay

## 2017-03-15 DIAGNOSIS — R918 Other nonspecific abnormal finding of lung field: Secondary | ICD-10-CM

## 2017-03-15 DIAGNOSIS — G8918 Other acute postprocedural pain: Secondary | ICD-10-CM

## 2017-03-15 DIAGNOSIS — Z4802 Encounter for removal of sutures: Secondary | ICD-10-CM

## 2017-03-15 MED ORDER — TRAMADOL HCL 50 MG PO TABS: 50.0000 mg | ORAL_TABLET | Freq: Four times a day (QID) | ORAL | 0 refills | Status: DC | PRN

## 2017-03-18 ENCOUNTER — Other Ambulatory Visit: Payer: Self-pay | Admitting: Thoracic Surgery (Cardiothoracic Vascular Surgery)

## 2017-03-18 ENCOUNTER — Other Ambulatory Visit: Payer: Self-pay

## 2017-03-18 DIAGNOSIS — G8918 Other acute postprocedural pain: Secondary | ICD-10-CM

## 2017-03-18 DIAGNOSIS — J849 Interstitial pulmonary disease, unspecified: Secondary | ICD-10-CM

## 2017-03-18 DIAGNOSIS — M25561 Pain in right knee: Secondary | ICD-10-CM | POA: Diagnosis not present

## 2017-03-18 MED ORDER — OXYCODONE HCL 5 MG PO TABS: 5.0000 mg | ORAL_TABLET | Freq: Four times a day (QID) | ORAL | 0 refills | Status: DC | PRN

## 2017-03-18 NOTE — Telephone Encounter (Signed)
RX refill was ok' ed by Dr Roxan Hockey for Oxycodone 5 mg #30 no refills. Patient will pick up at front desk.

## 2017-03-25 ENCOUNTER — Encounter (HOSPITAL_COMMUNITY): Payer: Self-pay

## 2017-03-25 DIAGNOSIS — R918 Other nonspecific abnormal finding of lung field: Secondary | ICD-10-CM | POA: Diagnosis not present

## 2017-03-25 DIAGNOSIS — R911 Solitary pulmonary nodule: Secondary | ICD-10-CM | POA: Diagnosis not present

## 2017-03-25 DIAGNOSIS — R042 Hemoptysis: Secondary | ICD-10-CM | POA: Diagnosis not present

## 2017-03-26 ENCOUNTER — Encounter: Payer: Self-pay | Admitting: Thoracic Surgery (Cardiothoracic Vascular Surgery)

## 2017-03-26 ENCOUNTER — Ambulatory Visit (INDEPENDENT_AMBULATORY_CARE_PROVIDER_SITE_OTHER): Payer: Self-pay | Admitting: Thoracic Surgery (Cardiothoracic Vascular Surgery)

## 2017-03-26 ENCOUNTER — Ambulatory Visit
Admission: RE | Admit: 2017-03-26 | Discharge: 2017-03-26 | Disposition: A | Discharge status: Home / Self Care | Payer: 59 | Source: Ambulatory Visit | Attending: Thoracic Surgery (Cardiothoracic Vascular Surgery) | Admitting: Thoracic Surgery (Cardiothoracic Vascular Surgery)

## 2017-03-26 VITALS — BP 146/86 | HR 70 | Resp 16 | Ht 73.0 in | Wt 238.0 lb

## 2017-03-26 DIAGNOSIS — J849 Interstitial pulmonary disease, unspecified: Secondary | ICD-10-CM | POA: Diagnosis not present

## 2017-03-26 DIAGNOSIS — R918 Other nonspecific abnormal finding of lung field: Secondary | ICD-10-CM

## 2017-03-26 DIAGNOSIS — Z09 Encounter for follow-up examination after completed treatment for conditions other than malignant neoplasm: Secondary | ICD-10-CM

## 2017-03-26 NOTE — Progress Notes (Signed)
[address]       [place] [address]             [phone]    HPI: Ricardo Garcia returns for a scheduled postop visit  Ricardo Garcia is a 61 year old gentleman who presented with hemoptysis. He was found to have bilateral pulmonary infiltrates and 2 small right lung nodules one in the middle lobe and one in the superior segment of the right upper lobe. Bronchoscopy and biopsies were nondiagnostic. Cultures were negative.  I did a right VATS and lung biopsy and a right lower lobe superior segmentectomy on 03/07/2017. The nodule adjacent to the fiducial marker was a large, but benign reactive lymph node. The right middle lobe nodule was not palpable.  He had a lot of pain initially postoperatively but says that has eased off to some degree. He's had some pain in his patellar and Achilles tendons and was treated with prednisone for that. Initially he continued to have hemoptysis postop, but that has resolved (concurrent with prednisone).  Past Medical History:  Diagnosis Date  . Chemotherapeutic agent or infusion extravasation    patient denies  . Hyperlipidemia   . Hypertension   . PONV (postoperative nausea and vomiting)    when he was 61 years old  . Seizures ([place])    patient denies    Current Outpatient Prescriptions  Medication Sig Dispense Refill  . aspirin 81 MG tablet Take 81 mg by mouth daily.     . cloNIDine (CATAPRES) 0.1 MG tablet Take 1 tablet (0.1 mg total) by mouth 2 (two) times daily. 60 tablet 1  . lisinopril (PRINIVIL,ZESTRIL) 10 MG tablet Take 1 tablet (10 mg total) by mouth daily. 30 tablet 1  . montelukast (SINGULAIR) 10 MG tablet Take 1 tablet (10 mg total) by mouth at bedtime. 30 tablet 5  . simvastatin (ZOCOR) 40 MG tablet Take 1 tablet (40 mg total) by mouth daily at 6 PM. (Patient taking differently: Take 40 mg by mouth daily. ) 30 tablet 1  . traMADol (ULTRAM) 50 MG tablet Take 1 tablet (50 mg total) by mouth every 6 (six) hours as needed. 30  tablet 0  . oxyCODONE (OXY IR/ROXICODONE) 5 MG immediate release tablet Take 1 tablet (5 mg total) by mouth every 6 (six) hours as needed for severe pain. (Patient not taking: Reported on 03/26/2017) 30 tablet 0   No current facility-administered medications for this visit.     Physical Exam BP (!) 146/86 (BP Location: Right Arm, Patient Position: Sitting, Cuff Size: Large)   Pulse 70   Resp 16   Ht 6\' 1"  (1.854 m)   Wt 238 lb (108 kg)   SpO2 98% Comment: RA  BMI 31.51 kg/m  61 year old male in no acute distress Alert and oriented 3 with no focal deficits Incisions healing well Diminished breath sounds right base   Diagnostic Tests: CHEST  2 VIEW  COMPARISON:  03/10/2017  FINDINGS: Postsurgical changes with volume loss in the right hemithorax. Scarring/ atelectasis in the right lower lung. Small bilateral pleural effusions. No pneumothorax.  The heart is normal in size.  Visualized osseous structures are within normal limits.  IMPRESSION: Postsurgical changes in the right hemithorax.  Small bilateral pleural effusions.   Electronically Signed   By: Julian Hy M.D.   On: 03/26/2017 11:28 I personally reviewed the chest Xray and concur with the findings noted above  Impression: Ricardo Garcia is a 61 year old gentleman who had a  right VATS for lung biopsy including a superior segmentectomy because of the nodule in that area. The nodule turned out to be a benign reactive lymph node. He is recovering well from the surgery. His pain is improving. He only had to take 1 oxycodone tablet yesterday. He has not had any acute problems with his breathing. He has not had hemoptysis for the past week, which I think may be related related to prednisone he was given for tendinitis.  I reviewed the pathology report with him. This was not very specific. They did see lymphocytic and granulomatous inflammation, but were unable to put a name on the underlying disease process. AFB  and fungal cultures are still pending.  From a surgical standpoint he's doing well. His activities are unrestricted from my standpoint, although he was cautioned to build into new activities gradually. He has been driving without issues.  Plan: Return in one month with PA and lateral chest x-ray to follow-up small bilateral effusions  Follow-up with Dr. Cheron Every, MD [facility] [phone]

## 2017-03-27 DIAGNOSIS — R042 Hemoptysis: Secondary | ICD-10-CM | POA: Diagnosis not present

## 2017-03-27 LAB — HM HIV SCREENING LAB: HM HIV Screening: NEGATIVE

## 2017-03-28 LAB — CULTURE, FUNGUS WITHOUT SMEAR

## 2017-03-30 DIAGNOSIS — M79672 Pain in left foot: Secondary | ICD-10-CM | POA: Diagnosis not present

## 2017-04-13 DIAGNOSIS — M79672 Pain in left foot: Secondary | ICD-10-CM | POA: Diagnosis not present

## 2017-04-15 ENCOUNTER — Encounter: Payer: Self-pay | Admitting: Family Medicine

## 2017-04-15 ENCOUNTER — Ambulatory Visit (INDEPENDENT_AMBULATORY_CARE_PROVIDER_SITE_OTHER): Payer: 59 | Admitting: Family Medicine

## 2017-04-15 DIAGNOSIS — J324 Chronic pansinusitis: Secondary | ICD-10-CM

## 2017-04-15 DIAGNOSIS — L308 Other specified dermatitis: Secondary | ICD-10-CM | POA: Diagnosis not present

## 2017-04-15 DIAGNOSIS — J849 Interstitial pulmonary disease, unspecified: Secondary | ICD-10-CM

## 2017-04-15 DIAGNOSIS — L309 Dermatitis, unspecified: Secondary | ICD-10-CM | POA: Insufficient documentation

## 2017-04-15 DIAGNOSIS — E7800 Pure hypercholesterolemia, unspecified: Secondary | ICD-10-CM

## 2017-04-15 DIAGNOSIS — I10 Essential (primary) hypertension: Secondary | ICD-10-CM | POA: Diagnosis not present

## 2017-04-15 MED ORDER — CLOBETASOL PROPIONATE 0.05 % EX SOLN: 1.0000 "application " | Freq: Two times a day (BID) | CUTANEOUS | 2 refills | Status: DC

## 2017-04-15 MED ORDER — MONTELUKAST SODIUM 10 MG PO TABS: 10.0000 mg | ORAL_TABLET | Freq: Every day | ORAL | 3 refills | Status: DC

## 2017-04-15 MED ORDER — SIMVASTATIN 20 MG PO TABS: 20.0000 mg | ORAL_TABLET | Freq: Every day | ORAL | 3 refills | Status: DC

## 2017-04-15 MED ORDER — DILTIAZEM HCL ER COATED BEADS 120 MG PO CP24: 120.0000 mg | ORAL_CAPSULE | Freq: Every day | ORAL | 3 refills | Status: DC

## 2017-04-15 MED ORDER — HALOBETASOL PROPIONATE 0.05 % EX CREA: TOPICAL_CREAM | Freq: Two times a day (BID) | CUTANEOUS | 2 refills | Status: DC

## 2017-04-15 MED ORDER — LISINOPRIL 20 MG PO TABS: 20.0000 mg | ORAL_TABLET | Freq: Every day | ORAL | 3 refills | Status: DC

## 2017-04-15 NOTE — Assessment & Plan Note (Signed)
S: unknown controlled on simvastatin 20mg - had been on 40mg . Does have some myalgias since being on medicine- usually low back with standing but was never an issue before.  Lab Results  Component Value Date   CHOL 176 07/19/2016   HDL 60 07/19/2016   LDLCALC 100 07/19/2016   TRIG 80 07/19/2016   CHOLHDL 2.9 07/19/2016   A/P: we agreed to continue current regimen and recheck at CPE- if not controlled then would change to atorvastatin 20mg  likely. Simvastatin should be limited to 20mg  because of being on diltiazem so appropriate adjustment already made

## 2017-04-15 NOTE — Patient Instructions (Addendum)
Refilled medications- the regimen you are on is reasonable. Consider changing medicine after physical depending on labs at that time

## 2017-04-15 NOTE — Progress Notes (Signed)
Pre visit review using our clinic review tool, if applicable. No additional management support is needed unless otherwise documented below in the visit note. 

## 2017-04-15 NOTE — Assessment & Plan Note (Signed)
S: allergy symptoms with some blood in nose in past then started to cough up blood. Was referred to [facility]- started with x-ray. CT, PET, bronchoscopy all results negative. VATS done - biopsy sent to [facility]- sent to [facility] and could not diagnose- slides were also recently sent to [place]. Still has inflammation of unknown cause.   Some research that long term clonidine may cause inflammation in lung so Dr. Roxan Hockey stopped it- had some palpitations so he restarted A/P: anxious about his afternoon appointment with Dr. Roxan Hockey today- hopeful results are back from send out to [place].

## 2017-04-15 NOTE — Assessment & Plan Note (Signed)
S: allergies controlled on singulair alone. Zyrtec/flonase never helped. Sinus issues for years. Goes to aENT Dr. Azzie Glatter in [place] and gets allergy drops under the tongue (tested positive for some foods, trees, grass). Balloon sinoplasty a few years ago- colds would come and would not go away unless prolonged antibiotic- this helped some.  A/P: should continue ENT follow up. I did refill his singulair

## 2017-04-15 NOTE — Assessment & Plan Note (Signed)
S: controlled on lisinopril 20mg  and cartia 120 mg XL BP Readings from Last 3 Encounters:  04/15/17 136/82  03/26/17 (!) 146/86  03/10/17 112/61  A/P: there have recently been adjustments by specialists- he wants primary care to assume rx. Discussed he is at goal <140/90 and ok to continue current regimen

## 2017-04-15 NOTE — Assessment & Plan Note (Signed)
S: intermittent eczema issues. Has a slight amount of remaining Halobetasol propionate 0.05%- for skin Clobetasol propionate topical- scalp  Recently controlled without use A/P: patient asks to have rx on hand and these were refilled today

## 2017-04-15 NOTE — Progress Notes (Signed)
Phone: [phone]  Subjective:  Patient presents today to establish care.  Prior patient of Dr. Everlene Farrier. Chief complaint-noted.   See problem oriented charting  The following were reviewed and entered/updated in epic: Past Medical History:  Diagnosis Date  . Diverticulitis   . Hyperlipidemia   . Hypertension   . PONV (postoperative nausea and vomiting)    when he was 61 years old   Patient Active Problem List   Diagnosis Date Noted  . ILD (interstitial lung disease) ([place]) 03/07/2017    Priority: High  . Hemoptysis 12/11/2016    Priority: High  .  PULMONARY INFILTRATES...BILATERAL LOWER LOBES 12/11/2016    Priority: High  . Lung nodules 12/11/2016    Priority: High  . Eczema 04/15/2017    Priority: Medium  . Sinusitis, chronic 07/28/2013    Priority: Medium  . Hypertension 01/26/2012    Priority: Medium  . Hyperlipidemia 01/26/2012    Priority: Medium   Past Surgical History:  Procedure Laterality Date  .  bone spur removal     2016- foot  . ARTHROTOMY Right 11/15/2015   Procedure: RIGHT ANKLE ARTHROTOMY ;  Surgeon: Melrose Nakayama, MD;  Location: [place];  Service: Orthopedics;  Laterality: Right;  . balloon septoplasty    . COLONOSCOPY  2011  . ELECTROMAGNETIC NAVIGATION BROCHOSCOPY  12/20/2016  . FOREIGN BODY REMOVAL Right 11/15/2015   Procedure: RIGHT ANKLE LOOSE BODY REMOVAL;  Surgeon: Melrose Nakayama, MD;  Location: [place];  Service: Orthopedics;  Laterality: Right;  . HERNIA REPAIR     61 years old  . LUNG BIOPSY Right 03/07/2017   Procedure: RIGHT LUNG BIOPSY;  Surgeon: Melrose Nakayama, MD;  Location: [place];  Service: Thoracic;  Laterality: Right;  . LYMPH NODE DISSECTION Right 03/07/2017   Procedure: LYMPH NODE DISSECTION, right lung;  Surgeon: Melrose Nakayama, MD;  Location: [place];  Service: Thoracic;  Laterality: Right;  . SEGMENTECOMY Right 03/07/2017   Procedure: RIGHT LOWER LOBE SUPERIOR SEGMENTECTOMY;  Surgeon: Melrose Nakayama, MD;  Location:  [place];  Service: Thoracic;  Laterality: Right;  Marland Kitchen VIDEO ASSISTED THORACOSCOPY (VATS)/WEDGE RESECTION Right 03/07/2017   Procedure: RIGHT LUNG VIDEO ASSISTED THORACOSCOPY (VATS)/WEDGE RESECTION;  Surgeon: Melrose Nakayama, MD;  Location: [place];  Service: Thoracic;  Laterality: Right;    Family History  Problem Relation Age of Onset  . Hypertension Mother     uncontrolled  . Stroke Mother     98  . Other Mother     died- fell after stroke and broke ribs- "lungs filled with fluid"  . Other Father     unknown cause of death age 56  . Rheum arthritis Father   . Other Brother     some step siblingsonly    Medications- reviewed and updated Current Outpatient Prescriptions  Medication Sig Dispense Refill  . aspirin 81 MG tablet Take 81 mg by mouth daily.     . Bromelains 500 MG TABS Take 1 tablet by mouth 2 (two) times daily.    Marland Kitchen diltiazem (CARTIA XT) 120 MG 24 hr capsule Take 1 capsule (120 mg total) by mouth daily. 90 capsule 3  . lisinopril (PRINIVIL,ZESTRIL) 20 MG tablet Take 1 tablet (20 mg total) by mouth daily. 90 tablet 3  . montelukast (SINGULAIR) 10 MG tablet Take 1 tablet (10 mg total) by mouth at bedtime. 90 tablet 3  . simvastatin (ZOCOR) 20 MG tablet Take 1 tablet (20 mg total) by mouth daily. 90 tablet 3  .  clobetasol (TEMOVATE) 0.05 % external solution Apply 1 application topically 2 (two) times daily. 7 days maximum. For scalp 50 mL 2  . halobetasol (ULTRAVATE) 0.05 % cream Apply topically 2 (two) times daily. For eczema. 7 days maximum 50 g 2   No current facility-administered medications for this visit.     Allergies-reviewed and updated Allergies  Allergen Reactions  . Levaquin [Levofloxacin] Other (See Comments)    MYALGIAS.Marland KitchenMarland KitchenNOTED AFTER TREATMENT 10/2016    Social History   Social History  . Marital status: Divorced    Spouse name: N/A  . Number of children: N/A  . Years of education: N/A   Social History Main Topics  . Smoking status: Never Smoker   . Smokeless tobacco: Never Used  . Alcohol use 4.8 oz/week    7 Standard drinks or equivalent, 1 Glasses of wine per week     Comment: daily  . Drug use: No  . Sexual activity: Yes    Partners: Female   Other Topics Concern  . None   Social History Narrative   Divorced. Lives alone. 2 children- son 61 at [employer], daughter 31- [employer].       [place].    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 Negative for chills, fever and weight loss.  HENT: Positive for congestion. Negative for hearing loss and tinnitus.   Eyes: Negative for blurred vision and double vision.  Respiratory: Positive for cough and sputum production. Negative for wheezing.   Cardiovascular: Negative for chest pain and palpitations (not in last 2 weeks).  Gastrointestinal: Negative for heartburn and nausea.  Genitourinary: Negative for dysuria and urgency.  Musculoskeletal: Negative for myalgias and neck pain.  Skin: Negative for itching and rash.  Neurological: Negative for dizziness and headaches.  Endo/Heme/Allergies: Negative for polydipsia. Does not bruise/bleed easily.  Psychiatric/Behavioral: Negative for hallucinations and substance abuse.   Objective: BP 136/82 (BP Location: Left Arm, Patient Position: Sitting, Cuff Size: Large)   Pulse 80   Temp 98.7 F (37.1 C) (Oral)   Ht 6\' 2"  (1.88 m)   Wt 230 lb (104.3 kg)   SpO2 96%   BMI 29.53 kg/m  Gen: NAD, resting comfortably HEENT: Mucous membranes are moist. Oropharynx normal. TM normal. Eyes: sclera and lids normal, PERRLA Neck: no obvious thyromegaly, no cervical lymphadenopathy CV: RRR no murmurs rubs or gallops Lungs: CTAB no crackles, wheeze, rhonchi Abdomen: soft/nontender/nondistended/normal bowel sounds. No rebound or guarding.  Ext: no  edema Skin: warm, dry, no current rash Neuro: 5/5 strength in upper and lower extremities, normal gait, normal reflexes  Assessment/Plan:  Hypertension S: controlled on lisinopril 20mg  and cartia 120 mg XL BP Readings from Last 3 Encounters:  04/15/17 136/82  03/26/17 (!) 146/86  03/10/17 112/61  A/P: there have recently been adjustments by specialists- he wants primary care to assume rx. Discussed he is at goal <140/90 and ok to continue current regimen  Hyperlipidemia S: unknown controlled on simvastatin 20mg - had been on 40mg . Does have some myalgias since being on medicine- usually low back with standing but was never an issue before.  Lab Results  Component Value Date   CHOL 176 07/19/2016   HDL 60 07/19/2016   LDLCALC 100 07/19/2016   TRIG 80 07/19/2016   CHOLHDL 2.9 07/19/2016  A/P: we agreed to continue current regimen and recheck at CPE- if not controlled then would change to atorvastatin 20mg  likely. Simvastatin should be limited to 20mg  because of being on diltiazem so appropriate adjustment already made  Eczema S: intermittent eczema issues. Has a slight amount of remaining Halobetasol propionate 0.05%- for skin Clobetasol propionate topical- scalp  Recently controlled without use A/P: patient asks to have rx on hand and these were refilled today   Sinusitis, chronic S: allergies controlled on singulair alone. Zyrtec/flonase never helped. Sinus issues for years. Goes to aENT Dr. Azzie Glatter in [place] and gets allergy drops under the tongue (tested positive for some foods, trees, grass). Balloon sinoplasty a few years ago- colds would come and would not go away unless prolonged antibiotic- this helped some.  A/P: should continue ENT follow up. I did refill his singulair  ILD (interstitial lung disease) ([place]) S: allergy symptoms with some blood in nose in past then started to cough up blood. Was referred [facility]- started with x-ray. CT, PET, bronchoscopy all  results negative. VATS done - biopsy sent to [facility]- sent to [facility] and could not diagnose- slides were also recently sent to [place]. Still has inflammation of unknown cause.   Some research that long term clonidine may cause inflammation in lung so Dr. Roxan Hockey stopped it- had some palpitations so he restarted A/P: anxious about his afternoon appointment with Dr. Roxan Hockey today- hopeful results are back from send out to [place].   Return in about 4 months (around 08/15/2017) for physical. come fasting. .   Meds ordered this encounter  Medications  . DISCONTD: lisinopril (PRINIVIL,ZESTRIL) 20 MG tablet    Sig: Take 1 tablet by mouth daily.  Marland Kitchen DISCONTD: CARTIA XT 120 MG 24 hr capsule    Sig: Take 1 tablet by mouth daily.  . Bromelains 500 MG TABS    Sig: Take 1 tablet by mouth 2 (two) times daily.  Marland Kitchen DISCONTD: simvastatin (ZOCOR) 20 MG tablet    Sig: Take 20 mg by mouth daily.  Marland Kitchen diltiazem (CARTIA XT) 120 MG 24 hr capsule    Sig: Take 1 capsule (120 mg total) by mouth daily.    Dispense:  90 capsule    Refill:  3  . lisinopril (PRINIVIL,ZESTRIL) 20 MG tablet    Sig: Take 1 tablet (20 mg total) by mouth daily.    Dispense:  90 tablet    Refill:  3  . simvastatin (ZOCOR) 20 MG tablet    Sig: Take 1 tablet (20 mg total) by mouth daily.    Dispense:  90 tablet    Refill:  3  . montelukast (SINGULAIR) 10 MG tablet    Sig: Take 1 tablet (10 mg total) by mouth at bedtime.    Dispense:  90 tablet    Refill:  3  . halobetasol (ULTRAVATE) 0.05 % cream    Sig: Apply topically 2 (two) times daily. For eczema. 7 days maximum    Dispense:  50 g    Refill:  2  . clobetasol (TEMOVATE) 0.05 % external solution    Sig: Apply 1 application topically 2 (two) times daily. 7 days maximum. For scalp    Dispense:  50 mL    Refill:  2   Return precautions advised. Garret Reddish, MD

## 2017-04-19 ENCOUNTER — Other Ambulatory Visit: Payer: Self-pay | Admitting: Thoracic Surgery (Cardiothoracic Vascular Surgery)

## 2017-04-19 DIAGNOSIS — J849 Interstitial pulmonary disease, unspecified: Secondary | ICD-10-CM

## 2017-04-20 LAB — ACID FAST CULTURE WITH REFLEXED SENSITIVITIES (MYCOBACTERIA): Acid Fast Culture: NEGATIVE

## 2017-04-23 ENCOUNTER — Ambulatory Visit (INDEPENDENT_AMBULATORY_CARE_PROVIDER_SITE_OTHER): Payer: Self-pay | Admitting: Thoracic Surgery (Cardiothoracic Vascular Surgery)

## 2017-04-23 ENCOUNTER — Ambulatory Visit
Admission: RE | Admit: 2017-04-23 | Discharge: 2017-04-23 | Disposition: A | Discharge status: Home / Self Care | Payer: 59 | Source: Ambulatory Visit | Attending: Thoracic Surgery (Cardiothoracic Vascular Surgery) | Admitting: Thoracic Surgery (Cardiothoracic Vascular Surgery)

## 2017-04-23 VITALS — BP 137/81 | HR 85 | Resp 16 | Ht 74.0 in | Wt 230.0 lb

## 2017-04-23 DIAGNOSIS — J90 Pleural effusion, not elsewhere classified: Secondary | ICD-10-CM | POA: Diagnosis not present

## 2017-04-23 DIAGNOSIS — J849 Interstitial pulmonary disease, unspecified: Secondary | ICD-10-CM

## 2017-04-23 DIAGNOSIS — R918 Other nonspecific abnormal finding of lung field: Secondary | ICD-10-CM

## 2017-04-23 DIAGNOSIS — Z09 Encounter for follow-up examination after completed treatment for conditions other than malignant neoplasm: Secondary | ICD-10-CM

## 2017-04-23 NOTE — Progress Notes (Signed)
[address]       [place] [address]             [phone]    HPI: [name] returns today for scheduled postoperative follow-up visit  He is a 61 year old gentleman who originally presented with hemoptysis. He was found to have bilateral pulmonary infiltrates and 2 small right lung nodules. Bronchoscopy and biopsy were nondiagnostic and cultures were negative. I did a right VATS for lung biopsy and superior segmentectomy of the right lower lobe on 03/07/2017. The nodule adjacent to the fiducial marker was a large, but benign reactive lymph node. The pathology was sent to the [facility]. They described lymphocytic and granulomatous inflammation but were unable to name and underlying disease process.  I saw him in the office on 03/26/2017. He still has some incisional pain and was having problems with tendinitis, for which he was treated with prednisone.  Since his last visit his tendinitis has improved. He was only on prednisone for 6 days. He has not had any further hemoptysis. He has not had any problems with shortness of breath. He has been back in the gym but is not back to 100% yet. He does still have some incisional pain if he coughs. Apparently his pathology was never sent to the expert in [place]. The plan is now for him to be significant interstitial lung disease clinic at [facility]  He is no longer on clonidine. He has been started on diltiazem.  Past Medical History:  Diagnosis Date  . Diverticulitis   . Hyperlipidemia   . Hypertension   . PONV (postoperative nausea and vomiting)    when he was 61 years old     Current Outpatient Prescriptions  Medication Sig Dispense Refill  . aspirin 81 MG tablet Take 81 mg by mouth daily.     . Bromelains 500 MG TABS Take 1 tablet by mouth 2 (two) times daily.    . clobetasol (TEMOVATE) 0.05 % external solution Apply 1 application topically 2 (two) times daily. 7 days maximum. For scalp 50 mL 2  . diltiazem  (CARTIA XT) 120 MG 24 hr capsule Take 1 capsule (120 mg total) by mouth daily. 90 capsule 3  . halobetasol (ULTRAVATE) 0.05 % cream Apply topically 2 (two) times daily. For eczema. 7 days maximum 50 g 2  . lisinopril (PRINIVIL,ZESTRIL) 20 MG tablet Take 1 tablet (20 mg total) by mouth daily. 90 tablet 3  . montelukast (SINGULAIR) 10 MG tablet Take 1 tablet (10 mg total) by mouth at bedtime. 90 tablet 3  . simvastatin (ZOCOR) 20 MG tablet Take 1 tablet (20 mg total) by mouth daily. 90 tablet 3   No current facility-administered medications for this visit.     Physical Exam BP 137/81 (BP Location: Right Arm, Patient Position: Sitting, Cuff Size: Large)   Pulse 85   Resp 16   Ht 6\' 2"  (1.88 m)   Wt 230 lb (104.3 kg)   SpO2 96% Comment: ON RA  BMI 29.58 kg/m  61 year old man in no acute distress Alert and oriented 3 with no focal deficits No cervical or supraclavicular adenopathy Incisions well healed Lungs clear bilaterally with no crackles or wheezing  Diagnostic Tests: CHEST  2 VIEW  COMPARISON:  03/26/2017  FINDINGS: Cardiac shadow is within normal limits. Mild aortic calcifications are again seen. Left lung is well aerated and clear. Mild volume loss is noted on the right with some basilar scarring consistent with  the recent surgical procedure. Small amount of pleural fluid is again noted and stable.  IMPRESSION: Chronic changes in the right base consistent with the patient's given clinical history.  No acute abnormality noted.   Electronically Signed   By: Inez Catalina M.D.   On: 04/23/2017 09:16 I personally reviewed the chest x-ray currently above  Impression: [name] is a 61 year old gentleman with an inflammatory process in the lungs. He had a lung biopsy on 03/07/2017. He has recovered well from the surgery and is progressing nicely.  Unfortunately, neither our pathologist to the pathologist at the [facility] could establish a definitive  diagnosis. AFB and fungal cultures were negative. The plan is now for him to be evaluated at the interstitial lung disease clinic at [facility].  On the positive node, he has not had any further hemoptysis and is not having any active respiratory issues currently.  Plan: Continue to follow-up with Dr. Tama Gander  I will be happy to see [name] back at any time if I can be of any further assistance with his care.  Ricardo Nakayama, MD [facility] [phone]

## 2017-04-26 ENCOUNTER — Telehealth: Payer: Self-pay | Admitting: Family Medicine

## 2017-04-26 NOTE — Telephone Encounter (Signed)
Pt's meds already sent in.

## 2017-05-02 ENCOUNTER — Ambulatory Visit (INDEPENDENT_AMBULATORY_CARE_PROVIDER_SITE_OTHER): Payer: 59 | Admitting: Family Medicine

## 2017-05-02 ENCOUNTER — Encounter: Payer: Self-pay | Admitting: Family Medicine

## 2017-05-02 VITALS — BP 124/80 | HR 86 | Temp 99.3°F | Wt 232.0 lb

## 2017-05-02 DIAGNOSIS — K5792 Diverticulitis of intestine, part unspecified, without perforation or abscess without bleeding: Secondary | ICD-10-CM | POA: Diagnosis not present

## 2017-05-02 MED ORDER — CIPROFLOXACIN HCL 500 MG PO TABS: 500.0000 mg | ORAL_TABLET | Freq: Two times a day (BID) | ORAL | Status: DC

## 2017-05-02 MED ORDER — CIPROFLOXACIN HCL 500 MG PO TABS: 500.0000 mg | ORAL_TABLET | Freq: Two times a day (BID) | ORAL | 0 refills | Status: DC

## 2017-05-02 MED ORDER — METRONIDAZOLE 500 MG PO TABS: 500.0000 mg | ORAL_TABLET | Freq: Three times a day (TID) | ORAL | 0 refills | Status: DC

## 2017-05-02 NOTE — Progress Notes (Signed)
Subjective:  Ricardo Garcia is a 61 y.o. year old very pleasant male patient who presents for/with See problem oriented charting ROS- some chills, subjective fevers and elevated temperature in mid 99s. No dysuria or polyuria. No penile discharge. No nocturia.    Past Medical History-  Patient Active Problem List   Diagnosis Date Noted  . ILD (interstitial lung disease) ([place]) 03/07/2017    Priority: High  . Hemoptysis 12/11/2016    Priority: High  .  PULMONARY INFILTRATES...BILATERAL LOWER LOBES 12/11/2016    Priority: High  . Lung nodules 12/11/2016    Priority: High  . Eczema 04/15/2017    Priority: Medium  . Sinusitis, chronic 07/28/2013    Priority: Medium  . Hypertension 01/26/2012    Priority: Medium  . Hyperlipidemia 01/26/2012    Priority: Medium    Medications- reviewed and updated Current Outpatient Prescriptions  Medication Sig Dispense Refill  . aspirin 81 MG tablet Take 81 mg by mouth daily.     . Bromelains 500 MG TABS Take 1 tablet by mouth 2 (two) times daily.    . clobetasol (TEMOVATE) 0.05 % external solution Apply 1 application topically 2 (two) times daily. 7 days maximum. For scalp 50 mL 2  . diltiazem (CARTIA XT) 120 MG 24 hr capsule Take 1 capsule (120 mg total) by mouth daily. 90 capsule 3  . halobetasol (ULTRAVATE) 0.05 % cream Apply topically 2 (two) times daily. For eczema. 7 days maximum 50 g 2  . lisinopril (PRINIVIL,ZESTRIL) 20 MG tablet Take 1 tablet (20 mg total) by mouth daily. 90 tablet 3  . montelukast (SINGULAIR) 10 MG tablet Take 1 tablet (10 mg total) by mouth at bedtime. 90 tablet 3  . simvastatin (ZOCOR) 20 MG tablet Take 1 tablet (20 mg total) by mouth daily. 90 tablet 3  . Vitamin D, Ergocalciferol, (DRISDOL) 50000 units CAPS capsule Take 50,000 Units by mouth daily.    . ciprofloxacin (CIPRO) 500 MG tablet Take 1 tablet (500 mg total) by mouth 2 (two) times daily. 20 tablet 0  . metroNIDAZOLE (FLAGYL) 500 MG tablet Take 1 tablet (500 mg  total) by mouth 3 (three) times daily. 30 tablet 0   No current facility-administered medications for this visit.     Objective: BP 124/80 (BP Location: Left Arm, Patient Position: Sitting, Cuff Size: Large)   Pulse 86   Temp 99.3 F (37.4 C) (Oral)   Wt 232 lb (105.2 kg)   SpO2 95%   BMI 29.79 kg/m  Gen: NAD, resting comfortably CV: RRR no murmurs rubs or gallops Lungs: CTAB no crackles, wheeze, rhonchi Abdomen: soft/tender suprapubically as well as mildly in LLQ and RLQ/nondistended/normal bowel sounds. No rebound or guarding.  Ext: no edema Skin: warm, dry  Assessment/Plan:  Acute diverticulitis S: patient states he has had suprapubic and RLQ and LLQ abdominal pain (though most focused suprapubic and toward the left more) For 2-3 weeks on and off pain. This has intensified last few days and has noted aTemperature 99.5 at home. Last night he was Up all night with discomfort with pain up to 5-6/10. Slightly better after BM. Declines urinary symptoms.   4-5 years ago very similar symptoms and treated for diverticulitis with cipro/flagyl with good relief. Muscle aches with levaquin but has tolerated ciprofloxacin. Remembers diverticula on colonoscopy were reported.   Ibuprofen helps some A/P: suspected diverticulitis. Cover with cipro/flagyl for 10 days as per orders. He will return if lingering symptoms day 10 or follow up sooner for  worsening symptoms  Meds ordered this encounter  Medications  . Vitamin D, Ergocalciferol, (DRISDOL) 50000 units CAPS capsule    Sig: Take 50,000 Units by mouth daily.  . metroNIDAZOLE (FLAGYL) 500 MG tablet    Sig: Take 1 tablet (500 mg total) by mouth 3 (three) times daily.    Dispense:  30 tablet    Refill:  0  . ciprofloxacin (CIPRO) 500 MG tablet    Sig: Take 1 tablet (500 mg total) by mouth 2 (two) times daily.    Dispense:  20 tablet    Refill:  0  new acute condition (prior episode of diverticulitis with complete resolution. Higher risk  patient with unexplained interstitial lung disease, hypertension, hyperlipidemia.   Return precautions advised.  Garret Reddish, MD

## 2017-05-02 NOTE — Patient Instructions (Addendum)
cipro and flagyl(Metronidazole) combination for 10 days  No alcohol during this time  Listen to your stomach- can go as light as clear liquids or just use softer foods - slowly advance diet  Return in 10 days if symptoms persist. See [place] sooner if not improving or certainly if symptoms worsen such as worsening abdominal pain or higher temperature  Diverticulitis Diverticulitis is when small pockets in your large intestine (colon) get infected or swollen. This causes stomach pain and watery poop (diarrhea). These pouches are called diverticula. They form in people who have a condition called diverticulosis. Follow these instructions at home: Medicines   Take over-the-counter and prescription medicines only as told by your doctor. These include:  Antibiotics.  Pain medicines.  Fiber pills.  Probiotics.  Stool softeners.  Do not drive or use heavy machinery while taking prescription pain medicine.  If you were prescribed an antibiotic, take it as told. Do not stop taking it even if you feel better. General instructions   Follow a diet as told by your doctor.  When you feel better, your doctor may tell you to change your diet. You may need to eat a lot of fiber. Fiber makes it easier to poop (have bowel movements). Healthy foods with fiber include:  Berries.  Beans.  Lentils.  Green vegetables.  Exercise 3 or more times a week. Aim for 30 minutes each time. Exercise enough to sweat and make your heart beat faster.  Keep all follow-up visits as told. This is important. You may need to have an exam of the large intestine. This is called a colonoscopy. Contact a doctor if:  Your pain does not get better.  You have a hard time eating or drinking.  You are not pooping like normal. Get help right away if:  Your pain gets worse.  Your problems do not get better.  Your problems get worse very fast.  You have a fever.  You throw up (vomit) more than one time.  You  have poop that is:  Bloody.  Black.  [place]ry. Summary  Diverticulitis is when small pockets in your large intestine (colon) get infected or swollen.  Take medicines only as told by your doctor.  Follow a diet as told by your doctor. This information is not intended to replace advice given to you by your health care provider. Make sure you discuss any questions you have with your health care provider. Document Released: 05/28/2008 Document Revised: 12/27/2016 Document Reviewed: 12/27/2016 Elsevier Interactive Patient Education  2017 [place].   ___________________________________________  WE NOW OFFER   [facility] FAST TRACK!!!  SAME DAY Appointments for ACUTE CARE  Such as: Sprains, Injuries, cuts, abrasions, rashes, muscle pain, joint pain, back pain Colds, flu, sore throats, headache, allergies, cough, fever  Ear pain, sinus and eye infections Abdominal pain, nausea, vomiting, diarrhea, upset stomach Animal/insect bites  3 Easy Ways to Schedule: Walk-In Scheduling Call in scheduling Mychart Sign-up: [email or web address]

## 2017-05-23 DIAGNOSIS — J90 Pleural effusion, not elsewhere classified: Secondary | ICD-10-CM | POA: Diagnosis not present

## 2017-05-23 DIAGNOSIS — R918 Other nonspecific abnormal finding of lung field: Secondary | ICD-10-CM | POA: Diagnosis not present

## 2017-05-28 DIAGNOSIS — R911 Solitary pulmonary nodule: Secondary | ICD-10-CM | POA: Diagnosis not present

## 2017-05-28 DIAGNOSIS — J849 Interstitial pulmonary disease, unspecified: Secondary | ICD-10-CM | POA: Diagnosis not present

## 2017-06-25 DIAGNOSIS — H25813 Combined forms of age-related cataract, bilateral: Secondary | ICD-10-CM | POA: Diagnosis not present

## 2017-06-25 DIAGNOSIS — H04123 Dry eye syndrome of bilateral lacrimal glands: Secondary | ICD-10-CM | POA: Diagnosis not present

## 2017-06-25 DIAGNOSIS — H43393 Other vitreous opacities, bilateral: Secondary | ICD-10-CM | POA: Diagnosis not present

## 2017-07-08 DIAGNOSIS — R911 Solitary pulmonary nodule: Secondary | ICD-10-CM | POA: Diagnosis not present

## 2017-07-08 DIAGNOSIS — R0602 Shortness of breath: Secondary | ICD-10-CM | POA: Diagnosis not present

## 2017-07-08 DIAGNOSIS — J849 Interstitial pulmonary disease, unspecified: Secondary | ICD-10-CM | POA: Diagnosis not present

## 2017-08-20 ENCOUNTER — Ambulatory Visit (INDEPENDENT_AMBULATORY_CARE_PROVIDER_SITE_OTHER): Payer: 59 | Admitting: Family Medicine

## 2017-08-20 ENCOUNTER — Encounter: Payer: Self-pay | Admitting: Family Medicine

## 2017-08-20 VITALS — BP 138/88 | HR 76 | Temp 98.5°F | Ht 74.0 in | Wt 235.0 lb

## 2017-08-20 DIAGNOSIS — L308 Other specified dermatitis: Secondary | ICD-10-CM | POA: Diagnosis not present

## 2017-08-20 DIAGNOSIS — I10 Essential (primary) hypertension: Secondary | ICD-10-CM | POA: Diagnosis not present

## 2017-08-20 DIAGNOSIS — E785 Hyperlipidemia, unspecified: Secondary | ICD-10-CM | POA: Diagnosis not present

## 2017-08-20 DIAGNOSIS — J849 Interstitial pulmonary disease, unspecified: Secondary | ICD-10-CM | POA: Diagnosis not present

## 2017-08-20 DIAGNOSIS — Z0000 Encounter for general adult medical examination without abnormal findings: Secondary | ICD-10-CM

## 2017-08-20 DIAGNOSIS — E559 Vitamin D deficiency, unspecified: Secondary | ICD-10-CM

## 2017-08-20 DIAGNOSIS — Z125 Encounter for screening for malignant neoplasm of prostate: Secondary | ICD-10-CM

## 2017-08-20 LAB — CBC WITH DIFFERENTIAL/PLATELET
BASOS PCT: 0.9 % (ref 0.0–3.0)
Basophils Absolute: 0.1 10*3/uL (ref 0.0–0.1)
EOS PCT: 2.6 % (ref 0.0–5.0)
Eosinophils Absolute: 0.2 10*3/uL (ref 0.0–0.7)
HCT: 44.1 % (ref 39.0–52.0)
Hemoglobin: 14.8 g/dL (ref 13.0–17.0)
LYMPHS ABS: 1 10*3/uL (ref 0.7–4.0)
Lymphocytes Relative: 16.1 % (ref 12.0–46.0)
MCHC: 33.6 g/dL (ref 30.0–36.0)
MCV: 93.9 fl (ref 78.0–100.0)
MONOS PCT: 10.5 % (ref 3.0–12.0)
Monocytes Absolute: 0.7 10*3/uL (ref 0.1–1.0)
NEUTROS PCT: 69.9 % (ref 43.0–77.0)
Neutro Abs: 4.5 10*3/uL (ref 1.4–7.7)
Platelets: 225 10*3/uL (ref 150.0–400.0)
RBC: 4.7 Mil/uL (ref 4.22–5.81)
RDW: 14.4 % (ref 11.5–15.5)
WBC: 6.5 10*3/uL (ref 4.0–10.5)

## 2017-08-20 LAB — LIPID PANEL
CHOLESTEROL: 222 mg/dL — AB (ref 0–200)
HDL: 58.9 mg/dL (ref >39.00)
LDL Cholesterol: 145 mg/dL — ABNORMAL HIGH (ref 0–99)
NonHDL: 162.66
Total CHOL/HDL Ratio: 4
Triglycerides: 90 mg/dL (ref 0.0–149.0)
VLDL: 18 mg/dL (ref 0.0–40.0)

## 2017-08-20 LAB — COMPREHENSIVE METABOLIC PANEL
ALBUMIN: 4.4 g/dL (ref 3.5–5.2)
ALK PHOS: 48 U/L (ref 39–117)
ALT: 19 U/L (ref 0–53)
AST: 19 U/L (ref 0–37)
BUN: 16 mg/dL (ref 6–23)
CO2: 30 mEq/L (ref 19–32)
Calcium: 9.8 mg/dL (ref 8.4–10.5)
Chloride: 99 mEq/L (ref 96–112)
Creatinine, Ser: 1.08 mg/dL (ref 0.40–1.50)
GFR: 73.63 mL/min (ref >60.00)
Glucose, Bld: 104 mg/dL — ABNORMAL HIGH (ref 70–99)
POTASSIUM: 4.7 meq/L (ref 3.5–5.1)
Sodium: 138 mEq/L (ref 135–145)
TOTAL PROTEIN: 7 g/dL (ref 6.0–8.3)
Total Bilirubin: 0.6 mg/dL (ref 0.2–1.2)

## 2017-08-20 LAB — VITAMIN D 25 HYDROXY (VIT D DEFICIENCY, FRACTURES): VITD: 45.19 ng/mL (ref 30.00–100.00)

## 2017-08-20 LAB — PSA: PSA: 0.87 ng/mL (ref 0.10–4.00)

## 2017-08-20 MED ORDER — SIMVASTATIN 20 MG PO TABS: 20.0000 mg | ORAL_TABLET | Freq: Every day | ORAL | 11 refills | Status: DC

## 2017-08-20 MED ORDER — MONTELUKAST SODIUM 10 MG PO TABS: 10.0000 mg | ORAL_TABLET | Freq: Every day | ORAL | 11 refills | Status: DC

## 2017-08-20 MED ORDER — LISINOPRIL 20 MG PO TABS: 20.0000 mg | ORAL_TABLET | Freq: Every day | ORAL | 11 refills | Status: DC

## 2017-08-20 MED ORDER — AMOXICILLIN-POT CLAVULANATE 875-125 MG PO TABS: 1.0000 | ORAL_TABLET | Freq: Two times a day (BID) | ORAL | 0 refills | Status: AC

## 2017-08-20 MED ORDER — DILTIAZEM HCL ER COATED BEADS 120 MG PO CP24: 120.0000 mg | ORAL_CAPSULE | Freq: Every day | ORAL | 11 refills | Status: DC

## 2017-08-20 NOTE — Progress Notes (Signed)
Phone: [phone]  Subjective:  Patient presents today for their annual physical. Chief complaint-noted.   See problem oriented charting- ROS- full  review of systems was completed and negative except for: nasal congestion increased from baseline as well as some cough with the drainage  The following were reviewed and entered/updated in epic: Past Medical History:  Diagnosis Date  . Diverticulitis   . Hyperlipidemia   . Hypertension   . PONV (postoperative nausea and vomiting)    when he was 60 years old   Patient Active Problem List   Diagnosis Date Noted  . ILD (interstitial lung disease) ([place]) 03/07/2017    Priority: High  . Hemoptysis 12/11/2016    Priority: High  .  PULMONARY INFILTRATES...BILATERAL LOWER LOBES 12/11/2016    Priority: High  . Lung nodules 12/11/2016    Priority: High  . Eczema 04/15/2017    Priority: Medium  . Sinusitis, chronic 07/28/2013    Priority: Medium  . Hypertension 01/26/2012    Priority: Medium  . Hyperlipidemia 01/26/2012    Priority: Medium   Past Surgical History:  Procedure Laterality Date  .  bone spur removal     2016- foot  . ARTHROTOMY Right 11/15/2015   Procedure: RIGHT ANKLE ARTHROTOMY ;  Surgeon: Melrose Nakayama, MD;  Location: [facility];  Service: Orthopedics;  Laterality: Right;  . balloon septoplasty    . COLONOSCOPY  2011  . ELECTROMAGNETIC NAVIGATION BROCHOSCOPY  12/20/2016  . FOREIGN BODY REMOVAL Right 11/15/2015   Procedure: RIGHT ANKLE LOOSE BODY REMOVAL;  Surgeon: Melrose Nakayama, MD;  Location: [facility];  Service: Orthopedics;  Laterality: Right;  . HERNIA REPAIR     61 years old  . LUNG BIOPSY Right 03/07/2017   Procedure: RIGHT LUNG BIOPSY;  Surgeon: Melrose Nakayama, MD;  Location: [facility];  Service: Thoracic;  Laterality: Right;  . LYMPH NODE DISSECTION Right 03/07/2017   Procedure: LYMPH NODE DISSECTION, right lung;  Surgeon: Melrose Nakayama, MD;  Location: [facility];  Service: Thoracic;  Laterality: Right;  .  SEGMENTECOMY Right 03/07/2017   Procedure: RIGHT LOWER LOBE SUPERIOR SEGMENTECTOMY;  Surgeon: Melrose Nakayama, MD;  Location: [facility];  Service: Thoracic;  Laterality: Right;  Marland Kitchen VIDEO ASSISTED THORACOSCOPY (VATS)/WEDGE RESECTION Right 03/07/2017   Procedure: RIGHT LUNG VIDEO ASSISTED THORACOSCOPY (VATS)/WEDGE RESECTION;  Surgeon: Melrose Nakayama, MD;  Location: [facility];  Service: Thoracic;  Laterality: Right;    Family History  Problem Relation Age of Onset  . Hypertension Mother        uncontrolled  . Stroke Mother        29  . Other Mother        died- fell after stroke and broke ribs- "lungs filled with fluid"  . Other Father        unknown cause of death age 37  . Rheum arthritis Father   . Other Brother        some step siblingsonly    Medications- reviewed and updated Current Outpatient Prescriptions  Medication Sig Dispense Refill  . aspirin 81 MG tablet Take 81 mg by mouth daily.     . Bromelains 500 MG TABS Take 1 tablet by mouth 2 (two) times daily.    . clobetasol (TEMOVATE) 0.05 % external solution Apply 1 application topically 2 (two) times daily. 7 days maximum. For scalp 50 mL 2  . diltiazem (CARTIA XT) 120 MG 24 hr capsule Take 1 capsule (120 mg total) by mouth daily. 90 capsule 3  .  halobetasol (ULTRAVATE) 0.05 % cream Apply topically 2 (two) times daily. For eczema. 7 days maximum 50 g 2  . lisinopril (PRINIVIL,ZESTRIL) 20 MG tablet Take 1 tablet (20 mg total) by mouth daily. 90 tablet 3  . montelukast (SINGULAIR) 10 MG tablet Take 1 tablet (10 mg total) by mouth at bedtime. 90 tablet 3  . simvastatin (ZOCOR) 20 MG tablet Take 1 tablet (20 mg total) by mouth daily. 90 tablet 3  . Vitamin D, Ergocalciferol, (DRISDOL) 50000 units CAPS capsule Take 50,000 Units by mouth daily.     No current facility-administered medications for this visit.     Allergies-reviewed and updated Allergies  Allergen Reactions  . Levaquin [Levofloxacin] Other (See Comments)     MYALGIAS.Marland KitchenMarland KitchenNOTED AFTER TREATMENT 10/2016    Social History   Social History  . Marital status: Divorced    Spouse name: N/A  . Number of children: N/A  . Years of education: N/A   Social History Main Topics  . Smoking status: Never Smoker  . Smokeless tobacco: Never Used  . Alcohol use 4.8 oz/week    7 Standard drinks or equivalent, 1 Glasses of wine per week     Comment: daily  . Drug use: No  . Sexual activity: Yes    Partners: Female   Other Topics Concern  . None   Social History Narrative   Divorced. Lives alone. 2 children- son 3 at [employer], daughter 4- [employer].       [place].    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 Oropharynx normal Neck: no thyromegaly CV: RRR no murmurs rubs or gallops Lungs: CTAB no crackles, wheeze, rhonchi Abdomen: soft/nontender/nondistended/normal bowel sounds. No rebound or guarding.  Ext: no edema Skin: warm, dry Neuro: grossly normal, moves all extremities, PERRLA Rectal: normal tone, normal sized prostate, no masses or tenderness  Assessment/Plan:  61 y.o. male presenting for annual physical.  Health Maintenance counseling: 1. Anticipatory guidance: Patient counseled regarding regular dental exams q6 months, eye exams - recent exam first time in 10 years, wearing seatbelts.  2. Risk factor reduction:  Advised patient of need for regular exercise and diet rich and fruits and vegetables to reduce risk of heart attack and stroke. Exercise- at the gym most days. Diet-slacking on diet- not as motivated with sinuses.  Wt Readings from Last 3 Encounters:    08/20/17 235 lb (106.6 kg)  05/02/17 232 lb (105.2 kg)  04/23/17 230 lb (104.3 kg)  3. Immunizations/screenings/ancillary studies-Advised follow-up flu shot.. Discussed would get shingles vaccination when available-shingrix Immunization History  Administered Date(s) Administered  . Influenza Split 10/16/2012, 09/23/2013  . Influenza, Seasonal, Injecte, Preservative Fre 12/24/2016  . Influenza,inj,Quad PF,6+ Mos 10/01/2015  . Influenza-Unspecified 10/18/2014  . Pneumococcal Polysaccharide-23 02/11/2017  . Tdap 12/24/2010  . Zoster 12/24/2010    4. Prostate cancer screening- update psa, low risk rectal exam      Component Value Date   PSA 0.93 07/19/2016   PSA 0.71 07/26/2015   PSA 0.83 01/26/2014   5.  Colon cancer screening - noted 01/12/10 with Dr. Benson Norway but need records. Diverticula noted. 10 year follow up planned.  6. Skin cancer screening- Dr. Allyson Sabal- as needed. Never had skin cancer. advised regular sunscreen use. Denies worrisome, changing, or new skin lesions.   Status of chronic or acute concerns   Interstitial lung disease-followed by [facility]- still no clear answers. Thought to be infectious that has cleared most likely.   HTN- Controlled on lisinopril 20 mg and Cardizem 120 mg extended release. Near borderline for stronger medication- encourahged lifestyle changes instead BP Readings from Last 3 Encounters:  08/20/17 138/88  05/02/17 124/80  04/23/17 137/81   HLD- Currently on simvastatin 20 mg with unknown control. Will update lipids today. Does have some myalgias. If not controlled will be changed to atorvastatin 20 mg (20 mg of simvastatin at his next dose with his diltiazem). Also on aspirin  Eczema-uses halobetasol for skin and clobetasol for scalp. Reasonably controlled  Chronic sinusitis-follows with ENT. Getting allergy drops under tongue. Also on singulair and antihistamine. Over the last 3 weeks has had increased sinus drainage. Usually responds to augmentin.  Thick clear drainage. Will trial augmentin- usually needs 10 days  Diverticulitis-treated in mid May with ciprofloxacin and Flagyl. No lingering symptoms  Vitamin d deficiency- check vitamin d today  6 month  Preventative health care - Plan: CBC with Differential/Platelet, Comprehensive metabolic panel, Lipid panel, PSA, VITAMIN D 25 Hydroxy (Vit-D Deficiency, Fractures)  Hyperlipidemia, unspecified hyperlipidemia type - Plan: CBC with Differential/Platelet, Comprehensive metabolic panel, Lipid panel  Screening PSA (prostate specific antigen) - Plan: PSA  Vitamin D deficiency - Plan: VITAMIN D 25 Hydroxy (Vit-D Deficiency, Fractures)  Meds ordered this encounter  Medications  . amoxicillin-clavulanate (AUGMENTIN) 875-125 MG tablet    Sig: Take 1 tablet by mouth 2 (two) times daily.    Dispense:  20 tablet    Refill:  0  . simvastatin (ZOCOR) 20 MG tablet    Sig: Take 1 tablet (20 mg total) by mouth daily.    Dispense:  30 tablet    Refill:  11  . lisinopril (PRINIVIL,ZESTRIL) 20 MG tablet    Sig: Take 1 tablet (20 mg total) by mouth daily.    Dispense:  30 tablet    Refill:  11  . diltiazem (CARTIA XT) 120 MG 24 hr capsule    Sig: Take 1 capsule (120 mg total) by mouth daily.    Dispense:  30 capsule    Refill:  11  . montelukast (SINGULAIR) 10 MG tablet    Sig: Take 1 tablet (10 mg total) by mouth at bedtime.    Dispense:  30 tablet    Refill:  11    Return precautions advised.  Garret Reddish, MD

## 2017-08-20 NOTE — Patient Instructions (Addendum)
Trial augmentin for 10 days- if not improved return to ENT  Sign release of information at the check out desk for colonoscopy from Dr. Benson Norway  Please stop by lab before you go

## 2017-08-21 ENCOUNTER — Other Ambulatory Visit: Payer: Self-pay

## 2017-08-21 MED ORDER — ATORVASTATIN CALCIUM 20 MG PO TABS: 20.0000 mg | ORAL_TABLET | Freq: Every day | ORAL | 3 refills | Status: DC

## 2017-08-23 ENCOUNTER — Telehealth: Payer: Self-pay | Admitting: Family Medicine

## 2017-08-23 DIAGNOSIS — E785 Hyperlipidemia, unspecified: Secondary | ICD-10-CM

## 2017-08-23 NOTE — Telephone Encounter (Signed)
Spoke with patient who has several questions regarding switching to atorvastatin. States he has been going to the gym the past couple of days and has noticed he is a little more stiff/sore normal. He was attributing this to the atorvastatin. He also was questioning about rechecking the levels in 6 months. I explained about insurance covering the lab cost and he verbalized understanding. He will call back next week to let us know how he is tolerating the medicine.

## 2017-08-23 NOTE — Telephone Encounter (Signed)
Pt would like jamie to return his call . Pt has additional questions

## 2017-08-23 NOTE — Telephone Encounter (Signed)
Is he wanting to check levels sooner than that? We could potentially check it 6-8 weeks out if he would prefer.

## 2017-08-30 NOTE — Telephone Encounter (Signed)
Spoke with pt and he states that the muscle soreness has improved. He does have some increased fatigue but thinks this may be attributable to his recent illness. He would like to have his levels rechecked in 6-8 weeks. Orders placed and appt scheduled.

## 2017-08-30 NOTE — Addendum Note (Signed)
Addended by: Beckie Busing on: 08/30/2017 04:52 PM   Modules accepted: Orders

## 2017-08-30 NOTE — Telephone Encounter (Signed)
Called and left a voicemail message asking patient to return phone call to see how he is tolerating medication and if he wants to have his levels rechecked in 6-8 weeks?

## 2017-08-31 NOTE — Telephone Encounter (Signed)
Some insurances wont cover full lipid panel If not spaced out at least a year. I would ask him to check with his insurance to see or we could order direct LDL only

## 2017-09-02 NOTE — Telephone Encounter (Signed)
Please follow up.  Thanks

## 2017-09-13 NOTE — Telephone Encounter (Signed)
Spoke with patient and discussed concerns over insurance paying for labs. He will check with them and see what they will cover. He will let me know after he talks to [facility].

## 2017-10-01 DIAGNOSIS — J301 Allergic rhinitis due to pollen: Secondary | ICD-10-CM | POA: Diagnosis not present

## 2017-10-01 DIAGNOSIS — R0982 Postnasal drip: Secondary | ICD-10-CM | POA: Diagnosis not present

## 2017-10-01 DIAGNOSIS — J329 Chronic sinusitis, unspecified: Secondary | ICD-10-CM | POA: Diagnosis not present

## 2017-10-15 ENCOUNTER — Other Ambulatory Visit: Payer: 59

## 2017-10-22 DIAGNOSIS — J849 Interstitial pulmonary disease, unspecified: Secondary | ICD-10-CM | POA: Diagnosis not present

## 2017-10-22 DIAGNOSIS — R918 Other nonspecific abnormal finding of lung field: Secondary | ICD-10-CM | POA: Diagnosis not present

## 2017-10-28 DIAGNOSIS — J90 Pleural effusion, not elsewhere classified: Secondary | ICD-10-CM | POA: Diagnosis not present

## 2017-10-28 DIAGNOSIS — R918 Other nonspecific abnormal finding of lung field: Secondary | ICD-10-CM | POA: Diagnosis not present

## 2017-10-28 DIAGNOSIS — J849 Interstitial pulmonary disease, unspecified: Secondary | ICD-10-CM | POA: Diagnosis not present

## 2017-10-29 DIAGNOSIS — Z23 Encounter for immunization: Secondary | ICD-10-CM | POA: Diagnosis not present

## 2017-10-30 DIAGNOSIS — J329 Chronic sinusitis, unspecified: Secondary | ICD-10-CM | POA: Diagnosis not present

## 2017-11-01 ENCOUNTER — Ambulatory Visit (INDEPENDENT_AMBULATORY_CARE_PROVIDER_SITE_OTHER): Payer: 59

## 2017-11-01 DIAGNOSIS — Z23 Encounter for immunization: Secondary | ICD-10-CM | POA: Diagnosis not present

## 2017-11-01 NOTE — Progress Notes (Signed)
Shingrix #1 given. Repeat 2-6 months  Ricardo Garcia

## 2017-11-21 DIAGNOSIS — J328 Other chronic sinusitis: Secondary | ICD-10-CM | POA: Diagnosis not present

## 2017-12-02 IMAGING — CR DG CHEST 2V
2 series · 2 of 2 positions shown · non-contrast
Comparison: 03/10/2017

CLINICAL DATA: Interstitial lung disease, status post right lung
biopsy with lymph node dissection and VATS on 03/07/2017

EXAM:
CHEST  2 VIEW

[w chest pa]
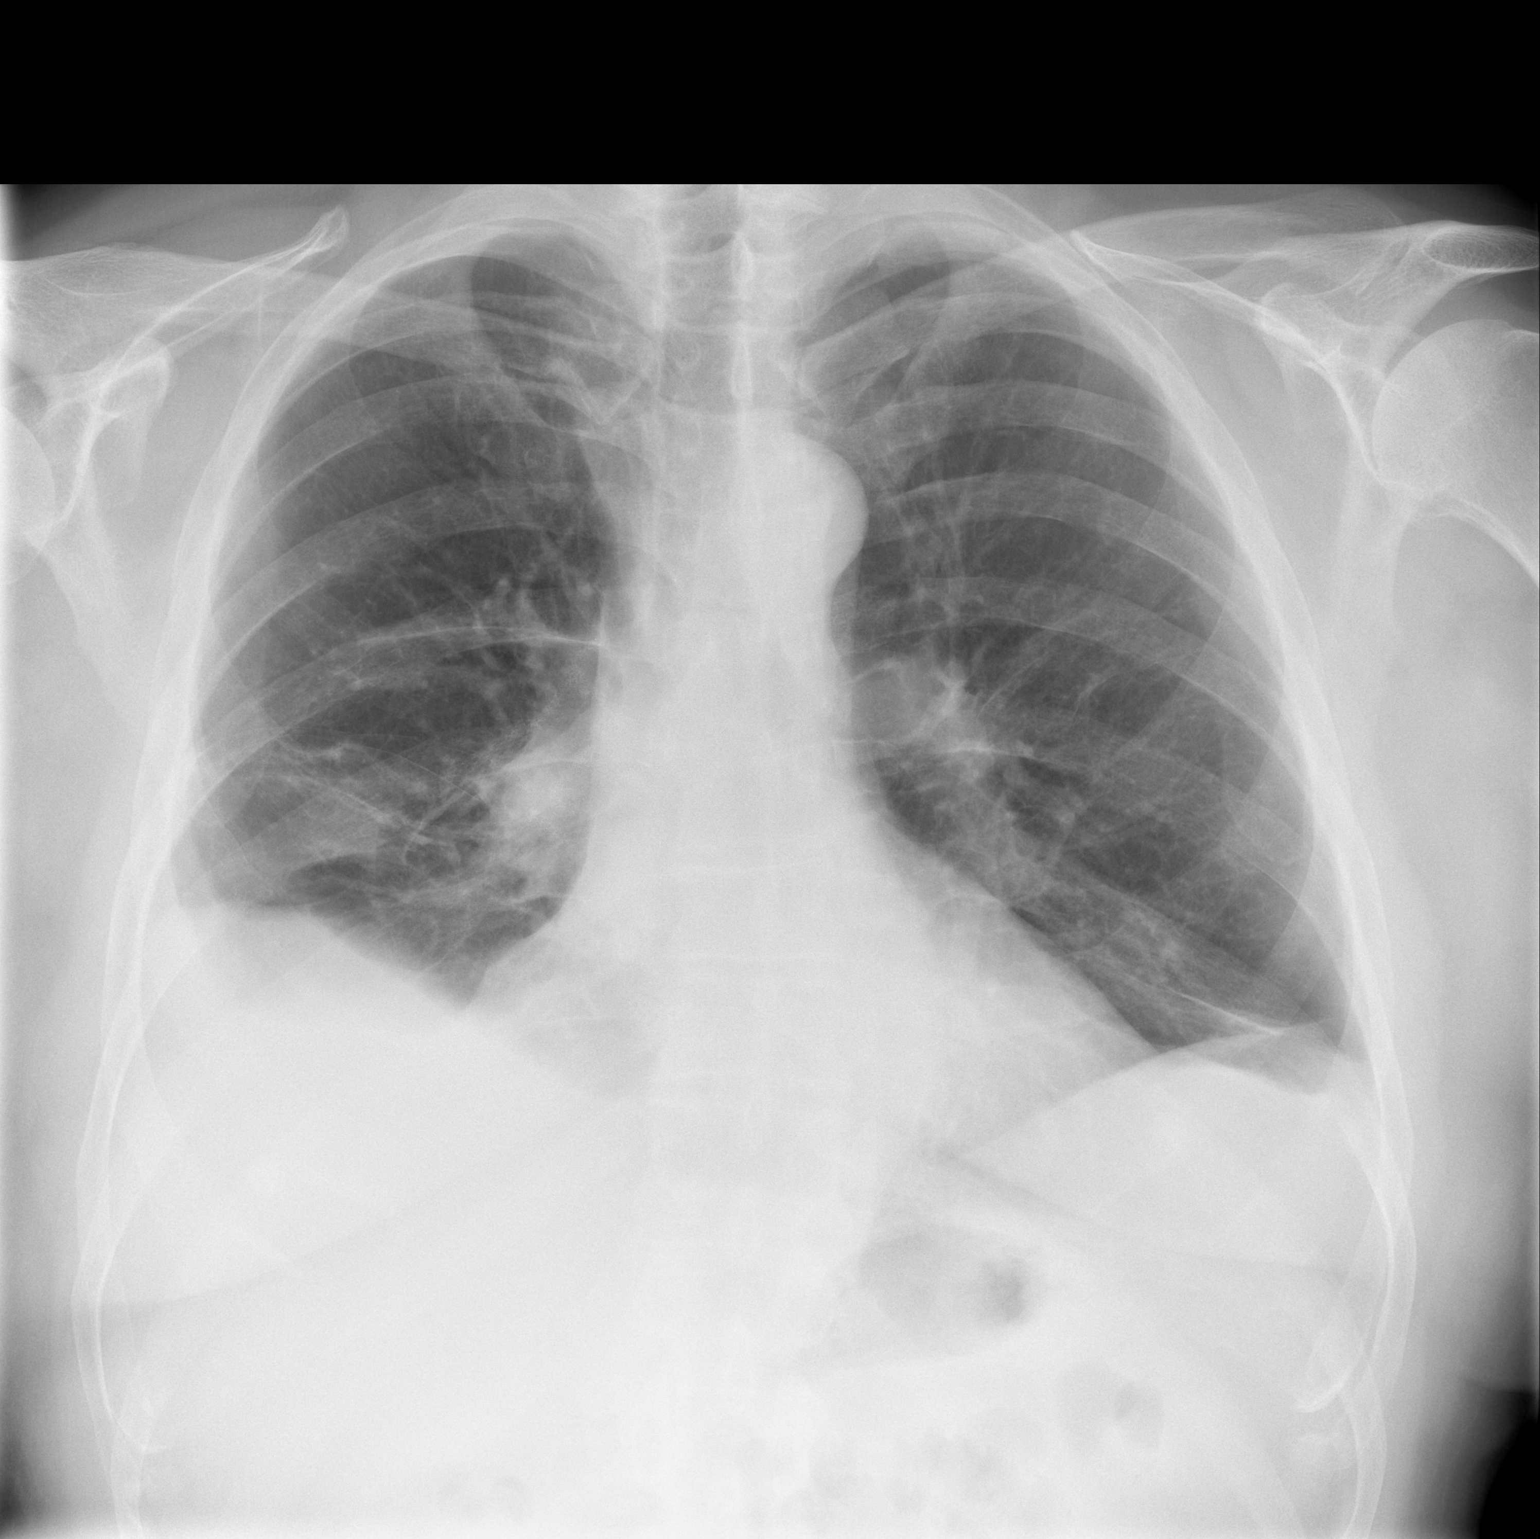

[w chest lat]
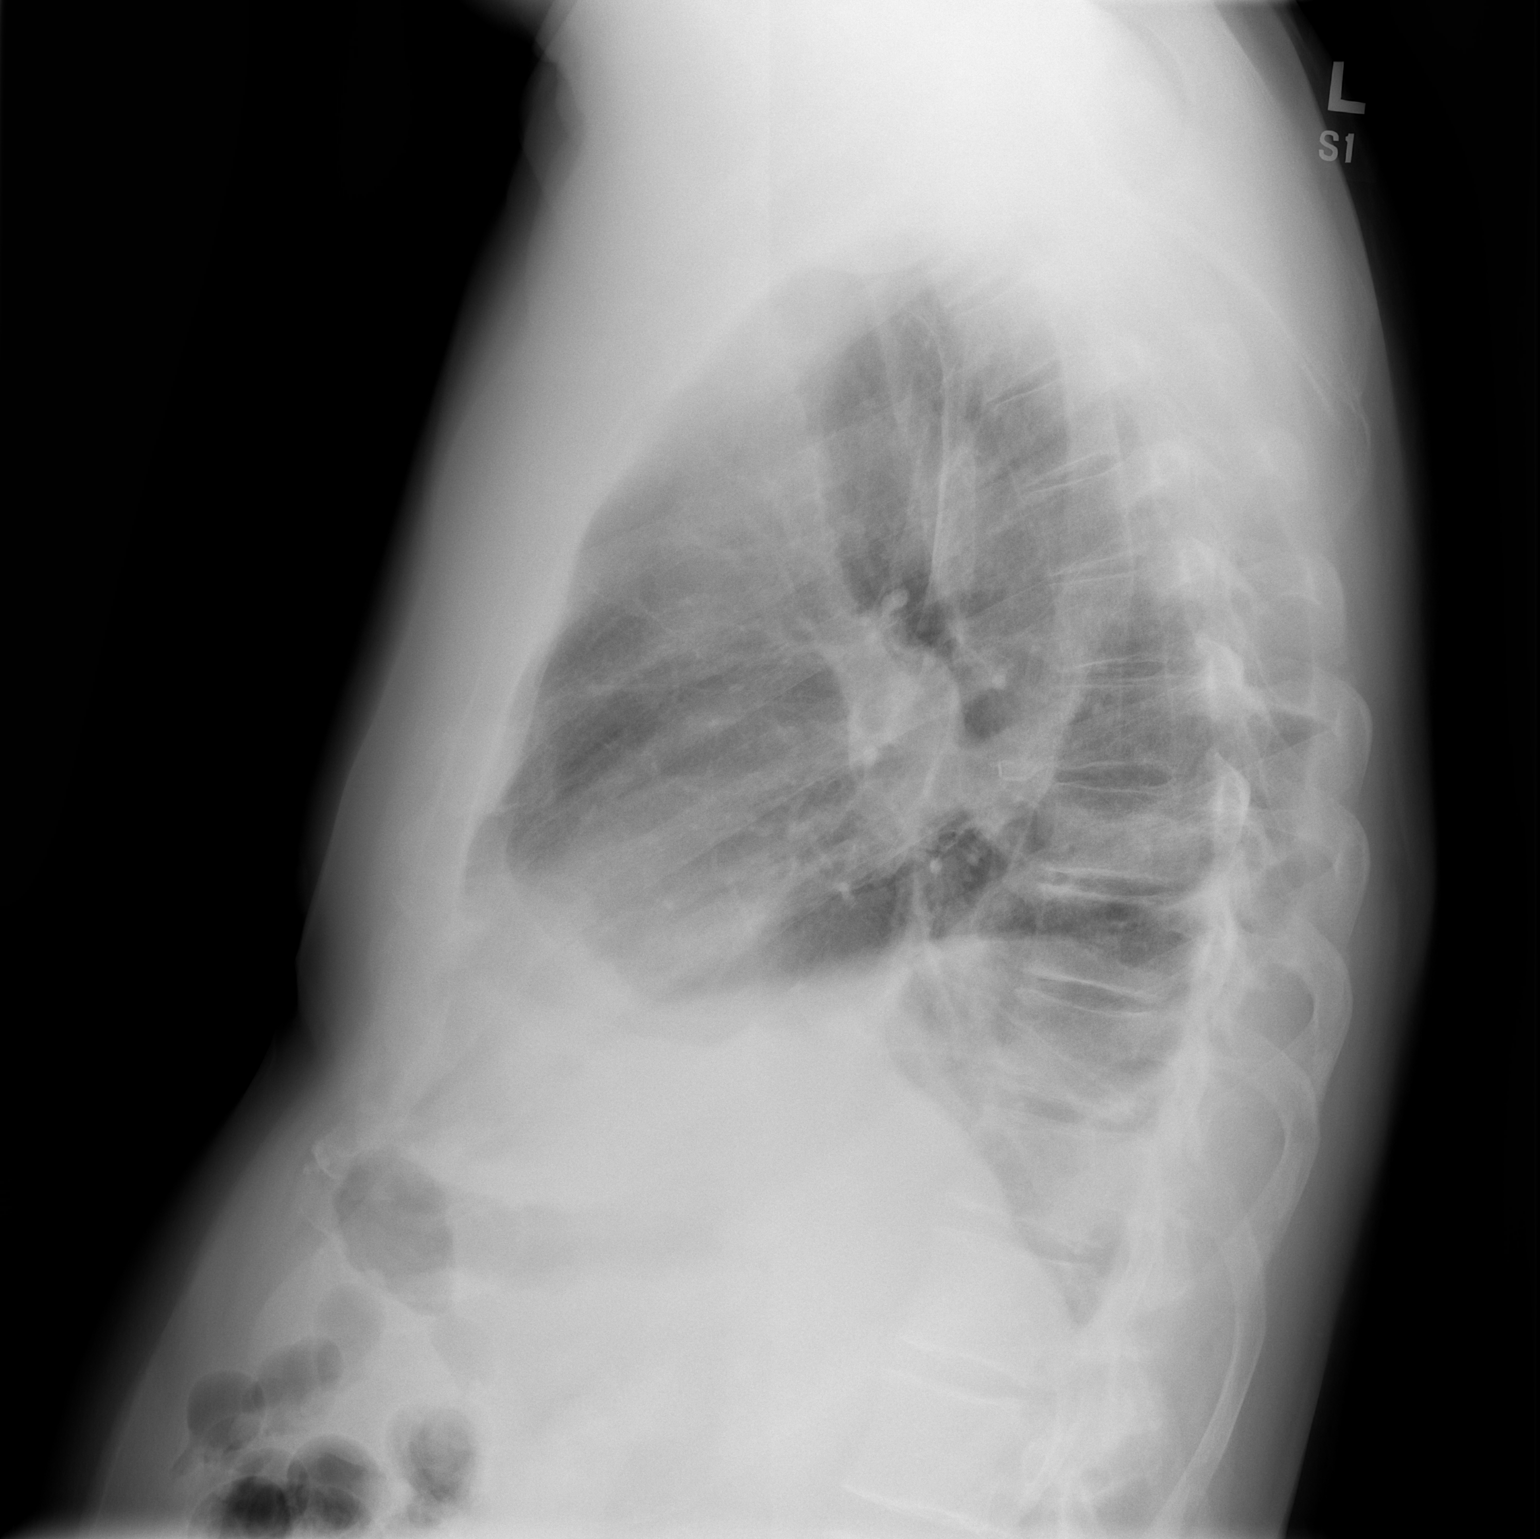

[2 of 2 positions shown; findings below may reference images not displayed]

FINDINGS: Postsurgical changes with volume loss in the right hemithorax.
Scarring/ atelectasis in the right lower lung. Small bilateral
pleural effusions. No pneumothorax.

The heart is normal in size.

Visualized osseous structures are within normal limits.
IMPRESSION: Postsurgical changes in the right hemithorax.

Small bilateral pleural effusions.

## 2017-12-30 IMAGING — DX DG CHEST 2V
2 series · 2 of 2 positions shown · non-contrast
Comparison: 03/26/2017

CLINICAL DATA: Status post right-sided VATS

EXAM:
CHEST  2 VIEW

[dg chest 2 view (1 of 2)]
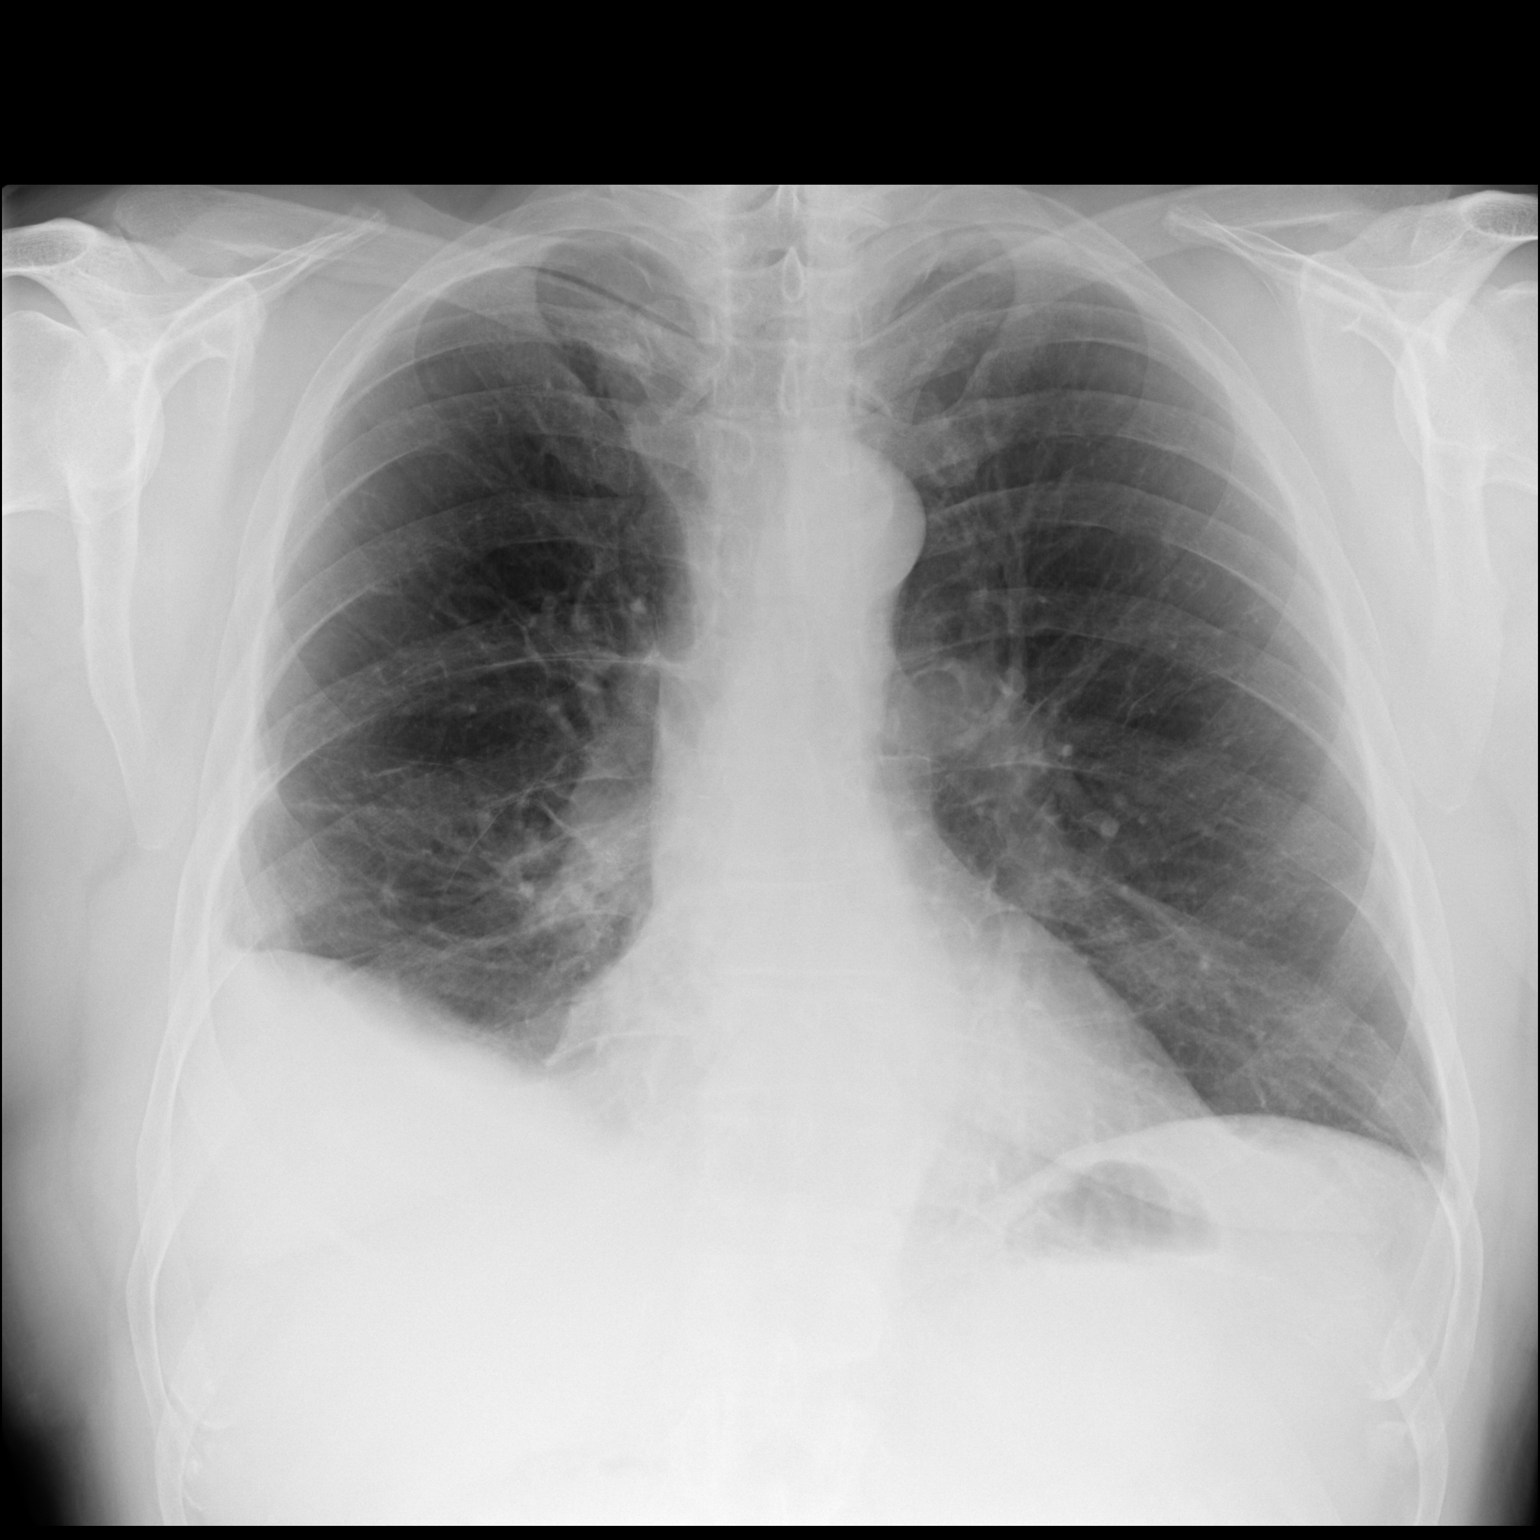

[dg chest 2 view (2 of 2)]
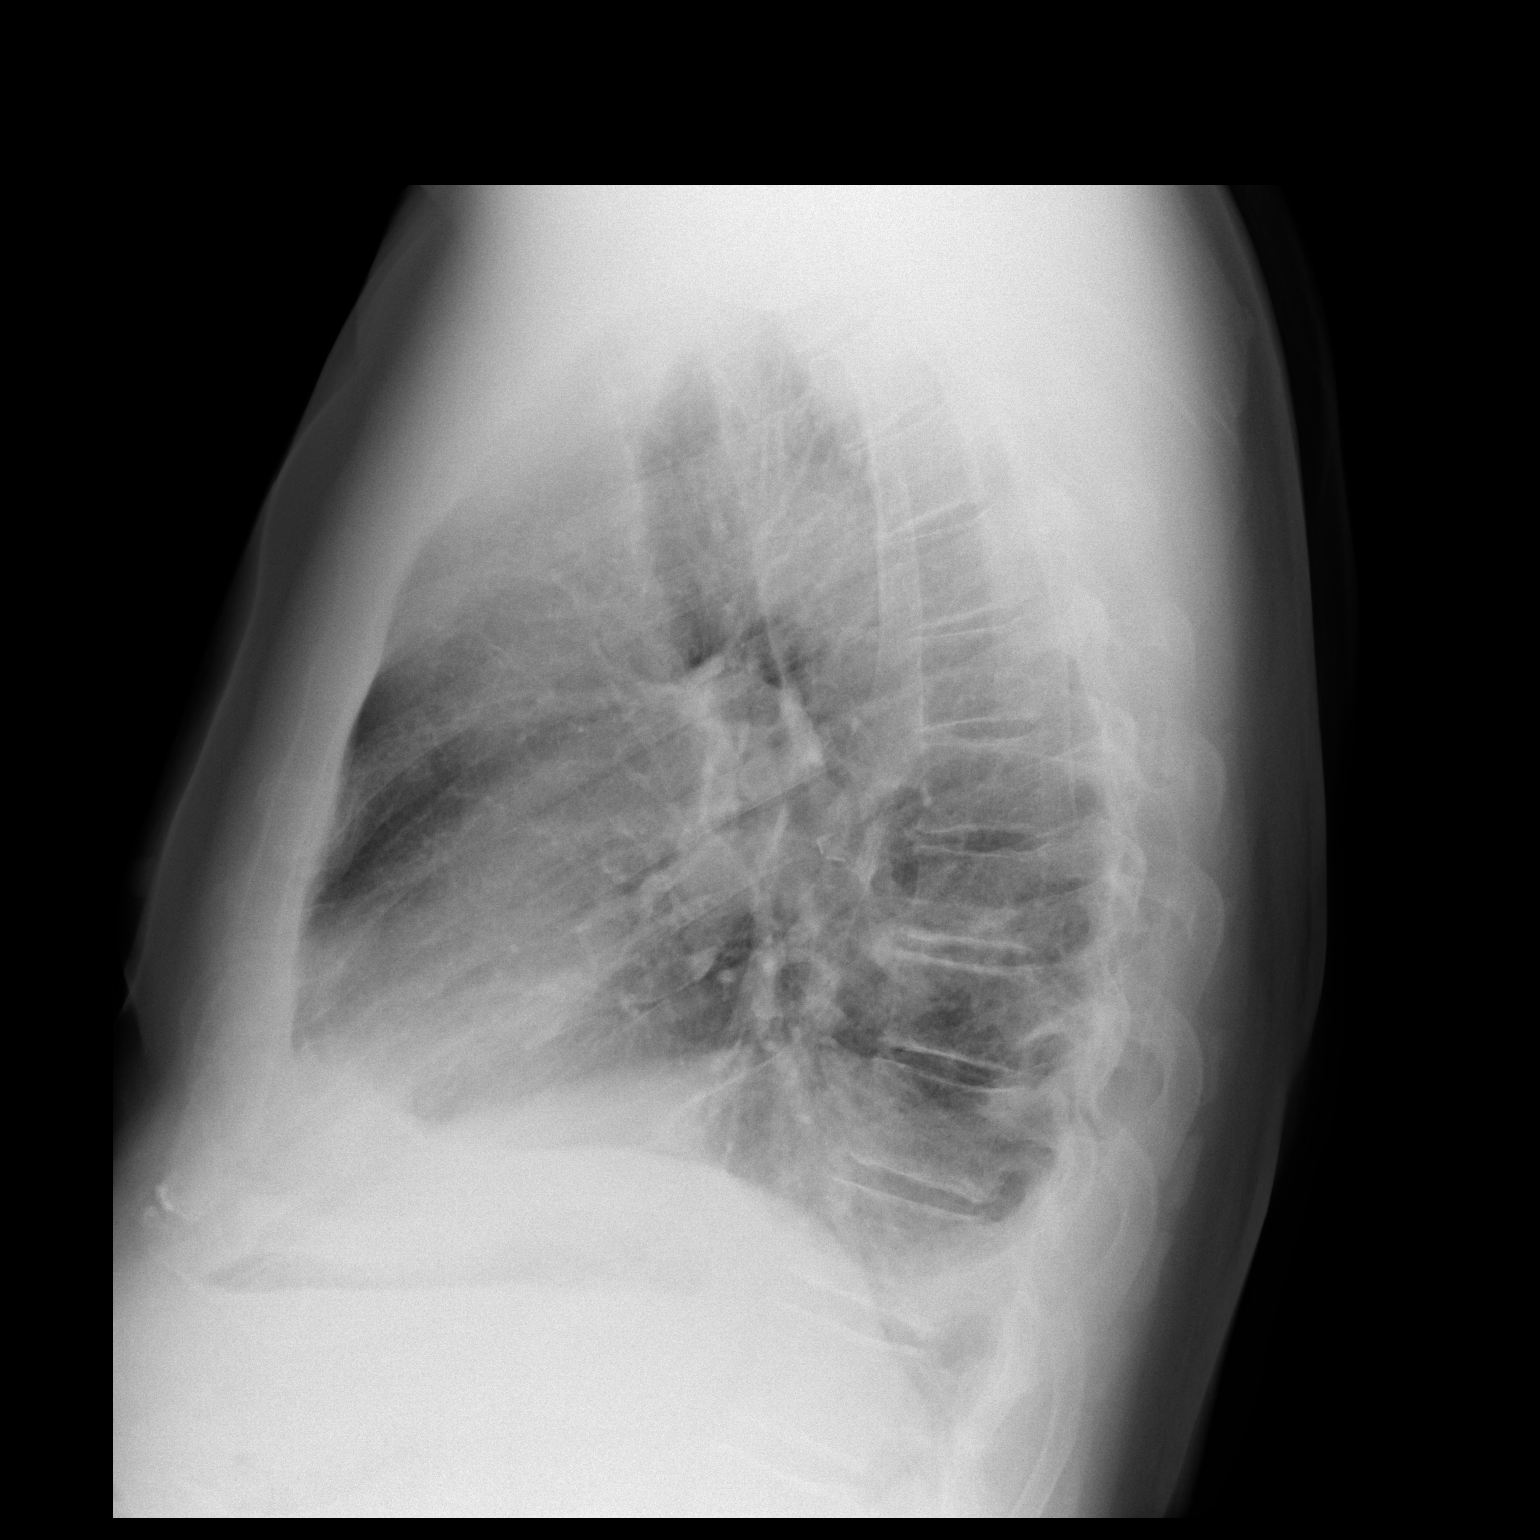

[2 of 2 positions shown; findings below may reference images not displayed]

FINDINGS: Cardiac shadow is within normal limits. Mild aortic calcifications
are again seen. Left lung is well aerated and clear. Mild volume
loss is noted on the right with some basilar scarring consistent
with the recent surgical procedure. Small amount of pleural fluid is
again noted and stable.
IMPRESSION: Chronic changes in the right base consistent with the patient's
given clinical history.

No acute abnormality noted.

## 2018-01-31 ENCOUNTER — Ambulatory Visit (INDEPENDENT_AMBULATORY_CARE_PROVIDER_SITE_OTHER): Payer: 59 | Admitting: *Deleted

## 2018-01-31 DIAGNOSIS — Z23 Encounter for immunization: Secondary | ICD-10-CM | POA: Diagnosis not present

## 2018-01-31 NOTE — Progress Notes (Signed)
Pt presented to office for 2nd Shigrix injection. Pt tolerated well and has completed the series.

## 2018-02-25 ENCOUNTER — Ambulatory Visit: Payer: 59 | Admitting: Family Medicine

## 2018-04-16 ENCOUNTER — Encounter: Payer: Self-pay | Admitting: Family Medicine

## 2018-04-16 ENCOUNTER — Ambulatory Visit: Payer: 59 | Admitting: Family Medicine

## 2018-04-16 DIAGNOSIS — J849 Interstitial pulmonary disease, unspecified: Secondary | ICD-10-CM | POA: Diagnosis not present

## 2018-04-16 DIAGNOSIS — I10 Essential (primary) hypertension: Secondary | ICD-10-CM | POA: Diagnosis not present

## 2018-04-16 DIAGNOSIS — E785 Hyperlipidemia, unspecified: Secondary | ICD-10-CM

## 2018-04-16 MED ORDER — ROSUVASTATIN CALCIUM 10 MG PO TABS
10.0000 mg | ORAL_TABLET | Freq: Every day | ORAL | 11 refills | Status: DC
Start: 2018-04-16 — End: 2019-04-01

## 2018-04-16 NOTE — Assessment & Plan Note (Signed)
S: controlled on 20 mg lisinopril and 20 mg Cardizem extended release.  He has also lost 11 pounds from last visit-started keto a few months ago. On home scales almost 20 lbs and wants to lose another 15.  Energy level is better. Also still getting to gym 4-5 days a week.   Last week 128/71 at home.  BP Readings from Last 3 Encounters:  04/16/18 136/78  08/20/17 138/88  05/02/17 124/80  A/P: We discussed blood pressure goal of <140/90. Continue current meds

## 2018-04-16 NOTE — Assessment & Plan Note (Signed)
S: Hopefully controlled on atorvastatin 20 mg-increased from simvastatin 20 mg due to labs below. He has some myalgias- particularly back pain if standing for an hour or so.  Lab Results  Component Value Date   CHOL 222 (H) 08/20/2017   HDL 58.90 08/20/2017   LDLCALC 145 (H) 08/20/2017   TRIG 90.0 08/20/2017   CHOLHDL 4 08/20/2017   A/P: had planned to Update LDL today. With myalgias worsening- we will trial rosuvastatin 10mg  instead. We agreed to pull off aspirin due to questionable benefit compared to risk.

## 2018-04-16 NOTE — Patient Instructions (Addendum)
Health Maintenance Due  Topic Date Due  . HIV Screening -Patient states he had one done at his Pulmonologist last year. Our team will call and get these records. 08/17/1971   4-6 month for physical  Fantastic job with weight loss!  Keep up the great work.  Great job with regular exercise.  I look forward to the next visit to see your continued progress

## 2018-04-16 NOTE — Assessment & Plan Note (Addendum)
Patient has upcoming follow-up with pulmonology.  He thinks the plan may be one more scan and if reassuring-may be released.  Apparently he has HIV testing already done in regards to health maintenance-we will try to get records.  Unfortunately he is still not heard back from [facility] despite reaching out for his slides to be read over 5 months.  This is frustrating for him and I completely understand-hope this gets resolved shortly.  Luckily he is not having any breathing issues at present.

## 2018-04-16 NOTE — Progress Notes (Signed)
Subjective:  Ricardo Garcia is a 62 y.o. year old very pleasant male patient who presents for/with See problem oriented charting ROS- No chest pain or shortness of breath. No headache or blurry vision.    Past Medical History-  Patient Active Problem List   Diagnosis Date Noted  . ILD (interstitial lung disease) ([place]) 03/07/2017    Priority: High  . Hemoptysis 12/11/2016    Priority: High  .  PULMONARY INFILTRATES...BILATERAL LOWER LOBES 12/11/2016    Priority: High  . Lung nodules 12/11/2016    Priority: High  . Eczema 04/15/2017    Priority: Medium  . Sinusitis, chronic 07/28/2013    Priority: Medium  . Hypertension 01/26/2012    Priority: Medium  . Hyperlipidemia 01/26/2012    Priority: Medium    Medications- reviewed and updated Current Outpatient Medications  Medication Sig Dispense Refill  . cetirizine (ZYRTEC) 5 MG tablet Take 10 mg by mouth daily.    Marland Kitchen atorvastatin (LIPITOR) 20 MG tablet Take 1 tablet (20 mg total) by mouth daily. 90 tablet 3  . Bromelains 500 MG TABS Take 1 tablet by mouth 2 (two) times daily.    . clobetasol (TEMOVATE) 0.05 % external solution Apply 1 application topically 2 (two) times daily. 7 days maximum. For scalp 50 mL 2  . diltiazem (CARTIA XT) 120 MG 24 hr capsule Take 1 capsule (120 mg total) by mouth daily. 30 capsule 11  . halobetasol (ULTRAVATE) 0.05 % cream Apply topically 2 (two) times daily. For eczema. 7 days maximum 50 g 2  . lisinopril (PRINIVIL,ZESTRIL) 20 MG tablet Take 1 tablet (20 mg total) by mouth daily. 30 tablet 11  . montelukast (SINGULAIR) 10 MG tablet Take 1 tablet (10 mg total) by mouth at bedtime. 30 tablet 11  . rosuvastatin (CRESTOR) 10 MG tablet Take 1 tablet (10 mg total) by mouth daily. 30 tablet 11  . Vitamin D, Ergocalciferol, (DRISDOL) 50000 units CAPS capsule Take 50,000 Units by mouth daily.     No current facility-administered medications for this visit.     Objective: BP 136/78   Pulse 84   Temp 98.9 F  (37.2 C) (Oral)   Ht 6\' 2"  (1.88 m)   Wt 224 lb 9.6 oz (101.9 kg)   SpO2 95%   BMI 28.84 kg/m  Gen: NAD, resting comfortably CV: RRR no murmurs rubs or gallops Lungs: CTAB no crackles, wheeze, rhonchi Abdomen: soft/nontender/overweight but much improved Ext: no edema Skin: warm, dry Neuro: Normal gait and speech  Assessment/Plan:  Hypertension S: controlled on 20 mg lisinopril and 20 mg Cardizem extended release.  He has also lost 11 pounds from last visit-started keto a few months ago. On home scales almost 20 lbs and wants to lose another 15.  Energy level is better. Also still getting to gym 4-5 days a week.   Last week 128/71 at home.  BP Readings from Last 3 Encounters:  04/16/18 136/78  08/20/17 138/88  05/02/17 124/80  A/P: We discussed blood pressure goal of <140/90. Continue current meds  Hyperlipidemia S: Hopefully controlled on atorvastatin 20 mg-increased from simvastatin 20 mg due to labs below. He has some myalgias- particularly back pain if standing for an hour or so.  Lab Results  Component Value Date   CHOL 222 (H) 08/20/2017   HDL 58.90 08/20/2017   LDLCALC 145 (H) 08/20/2017   TRIG 90.0 08/20/2017   CHOLHDL 4 08/20/2017   A/P: had planned to Update LDL today. With myalgias worsening- we  will trial rosuvastatin 10mg  instead. We agreed to pull off aspirin due to questionable benefit compared to risk.   ILD (interstitial lung disease) [facility]) Patient has upcoming follow-up with pulmonology.  Apparently he has HIV testing already done in regards to health maintenance-we will try to get records.  Unfortunately he is still not heard back from [facility] despite reaching out for his slides to be read over 5 months.  This is frustrating for him and I completely understand-hope this gets resolved shortly.  Luckily he is not having any breathing issues at present.  Return in about 6 months (around 10/16/2018) for physical.   Meds ordered this encounter  Medications  .  rosuvastatin (CRESTOR) 10 MG tablet    Sig: Take 1 tablet (10 mg total) by mouth daily.    Dispense:  30 tablet    Refill:  11    Return precautions advised.  Garret Reddish, MD

## 2018-06-17 DIAGNOSIS — H04123 Dry eye syndrome of bilateral lacrimal glands: Secondary | ICD-10-CM | POA: Diagnosis not present

## 2018-06-17 DIAGNOSIS — H43393 Other vitreous opacities, bilateral: Secondary | ICD-10-CM | POA: Diagnosis not present

## 2018-06-17 DIAGNOSIS — H25813 Combined forms of age-related cataract, bilateral: Secondary | ICD-10-CM | POA: Diagnosis not present

## 2018-06-25 DIAGNOSIS — K5732 Diverticulitis of large intestine without perforation or abscess without bleeding: Secondary | ICD-10-CM | POA: Diagnosis not present

## 2018-08-01 DIAGNOSIS — M79672 Pain in left foot: Secondary | ICD-10-CM | POA: Diagnosis not present

## 2018-08-04 ENCOUNTER — Other Ambulatory Visit: Payer: Self-pay | Admitting: Family Medicine

## 2018-09-06 ENCOUNTER — Other Ambulatory Visit: Payer: Self-pay | Admitting: Family Medicine

## 2018-09-22 ENCOUNTER — Encounter: Payer: Self-pay | Admitting: Family Medicine

## 2018-09-22 ENCOUNTER — Ambulatory Visit: Payer: 59 | Admitting: Family Medicine

## 2018-09-22 VITALS — BP 118/76 | HR 83 | Temp 98.6°F | Ht 74.0 in | Wt 225.8 lb

## 2018-09-22 DIAGNOSIS — K5792 Diverticulitis of intestine, part unspecified, without perforation or abscess without bleeding: Secondary | ICD-10-CM | POA: Diagnosis not present

## 2018-09-22 DIAGNOSIS — R1032 Left lower quadrant pain: Secondary | ICD-10-CM | POA: Diagnosis not present

## 2018-09-22 LAB — COMPREHENSIVE METABOLIC PANEL
ALT: 11 U/L (ref 0–53)
AST: 12 U/L (ref 0–37)
Albumin: 4 g/dL (ref 3.5–5.2)
Alkaline Phosphatase: 43 U/L (ref 39–117)
BUN: 15 mg/dL (ref 6–23)
CHLORIDE: 100 meq/L (ref 96–112)
CO2: 29 meq/L (ref 19–32)
CREATININE: 1.13 mg/dL (ref 0.40–1.50)
Calcium: 9.5 mg/dL (ref 8.4–10.5)
GFR: 69.64 mL/min (ref >60.00)
Glucose, Bld: 100 mg/dL — ABNORMAL HIGH (ref 70–99)
Potassium: 4.4 mEq/L (ref 3.5–5.1)
SODIUM: 137 meq/L (ref 135–145)
Total Bilirubin: 0.8 mg/dL (ref 0.2–1.2)
Total Protein: 6.6 g/dL (ref 6.0–8.3)

## 2018-09-22 LAB — CBC WITH DIFFERENTIAL/PLATELET
Basophils Absolute: 0 10*3/uL (ref 0.0–0.1)
Basophils Relative: 0.5 % (ref 0.0–3.0)
EOS ABS: 0.1 10*3/uL (ref 0.0–0.7)
Eosinophils Relative: 1.8 % (ref 0.0–5.0)
HCT: 40.6 % (ref 39.0–52.0)
Hemoglobin: 13.9 g/dL (ref 13.0–17.0)
LYMPHS ABS: 1.1 10*3/uL (ref 0.7–4.0)
Lymphocytes Relative: 13.4 % (ref 12.0–46.0)
MCHC: 34.3 g/dL (ref 30.0–36.0)
MCV: 88.6 fl (ref 78.0–100.0)
MONO ABS: 0.9 10*3/uL (ref 0.1–1.0)
Monocytes Relative: 10.8 % (ref 3.0–12.0)
NEUTROS ABS: 6.1 10*3/uL (ref 1.4–7.7)
NEUTROS PCT: 73.5 % (ref 43.0–77.0)
PLATELETS: 233 10*3/uL (ref 150.0–400.0)
RBC: 4.58 Mil/uL (ref 4.22–5.81)
RDW: 13.2 % (ref 11.5–15.5)
WBC: 8.3 10*3/uL (ref 4.0–10.5)

## 2018-09-22 LAB — POC URINALSYSI DIPSTICK (AUTOMATED)
Blood, UA: NEGATIVE
Glucose, UA: NEGATIVE
LEUKOCYTES UA: NEGATIVE
Nitrite, UA: NEGATIVE
Protein, UA: NEGATIVE
Spec Grav, UA: 1.025 (ref 1.010–1.025)
Urobilinogen, UA: 1 E.U./dL
pH, UA: 5.5 (ref 5.0–8.0)

## 2018-09-22 MED ORDER — AMOXICILLIN-POT CLAVULANATE 875-125 MG PO TABS: 1.0000 | ORAL_TABLET | Freq: Two times a day (BID) | ORAL | 0 refills | Status: DC

## 2018-09-22 NOTE — Progress Notes (Signed)
Subjective:  Ricardo Garcia is a 62 y.o. year old very pleasant male patient who presents for/with See problem oriented charting ROS- no fever, chills, vomiting.  Some pressure in bladder before urinating- improves with urination.   Past Medical History-  Patient Active Problem List   Diagnosis Date Noted  . ILD (interstitial lung disease) ([place]) 03/07/2017    Priority: High  . Hemoptysis 12/11/2016    Priority: High  .  PULMONARY INFILTRATES...BILATERAL LOWER LOBES 12/11/2016    Priority: High  . Lung nodules 12/11/2016    Priority: High  . Eczema 04/15/2017    Priority: Medium  . Sinusitis, chronic 07/28/2013    Priority: Medium  . Hypertension 01/26/2012    Priority: Medium  . Hyperlipidemia 01/26/2012    Priority: Medium    Medications- reviewed and updated Current Outpatient Medications  Medication Sig Dispense Refill  . Bromelains 500 MG TABS Take 1 tablet by mouth 2 (two) times daily.    Marland Kitchen CARTIA XT 120 MG 24 hr capsule TAKE 1 CAPSULE BY MOUTH ONCE DAILY 90 capsule 1  . cetirizine (ZYRTEC) 5 MG tablet Take 10 mg by mouth daily.    . clobetasol (TEMOVATE) 0.05 % external solution Apply 1 application topically 2 (two) times daily. 7 days maximum. For scalp 50 mL 2  . halobetasol (ULTRAVATE) 0.05 % cream Apply topically 2 (two) times daily. For eczema. 7 days maximum 50 g 2  . lisinopril (PRINIVIL,ZESTRIL) 20 MG tablet TAKE 1 TABLET BY MOUTH ONCE DAILY 90 tablet 1  . montelukast (SINGULAIR) 10 MG tablet TAKE 1 TABLET BY MOUTH ONCE DAILY AT BEDTIME 30 tablet 11  . rosuvastatin (CRESTOR) 10 MG tablet Take 1 tablet (10 mg total) by mouth daily. 30 tablet 11  . Vitamin D, Ergocalciferol, (DRISDOL) 50000 units CAPS capsule Take 50,000 Units by mouth daily.    Marland Kitchen amoxicillin-clavulanate (AUGMENTIN) 875-125 MG tablet Take 1 tablet by mouth 2 (two) times daily. 20 tablet 0   No current facility-administered medications for this visit.     Objective: BP 118/76 (BP Location: Right  Arm, Patient Position: Sitting, Cuff Size: Large)   Pulse 83   Temp 98.6 F (37 C) (Oral)   Ht 6\' 2"  (1.88 m)   Wt 225 lb 12.8 oz (102.4 kg)   SpO2 96%   BMI 28.99 kg/m  Gen: NAD, resting comfortably CV: RRR no murmurs rubs or gallops Lungs: CTAB no crackles, wheeze, rhonchi Abdomen: soft/nontender in upper abdomen. Very tender to palpation LLQ, mild pain across suprapubic area and into RLQ/nondistended/normal bowel sounds. No rebound or guarding.  Ext: no edema Skin: warm, dry  Assessment/Plan:  Diverticulitis - Plan: CBC with Differential/Platelet, Comprehensive metabolic panel, POCT Urinalysis Dipstick (Automated), POCT Urinalysis Dipstick (Automated)  LLQ abdominal pain - Plan: CBC with Differential/Platelet, Comprehensive metabolic panel, POCT Urinalysis Dipstick (Automated), POCT Urinalysis Dipstick (Automated) S: about a month ago had lower abdominal pain which resolved within a few days. Friday morning started to note pain but pain progressed rapidly and had to go home Friday afternoon. Pain level got up to 7/10 on Friday. Had to hunch over to walk. Didn't sleep well Friday night. Felt a little better on Saturday- was able to go to a cookout that day and then Saturday night worsened again. Same type of pattern on Sunday. Went low fiber foods yesterday.   Last bout of diverticulitis was in may 2018- wonders if he had another bout a few months ago- saw urgent care. Symptoms are getting better  today.  No fever or chills.   No BM on Saturday- usually pretty regular. Was regular on Sunday and Friday. Has been more firm recently- last few weeks. Friday was soft. Hard on Sunday.  A/P: from avs "Augmentin x 10 days for suspect diverticulitis. If you fail to improve within 48 hours of symptoms drastically worsen - seek care- if in daytime- lets see if we can set up a CT scan for you. "  We also discussed constipation as possible cause but with pinpoint pain I thought diverticulitis more  likely. We discussed doing scan today but opted out of this as well  Lab/Order associations: Diverticulitis - Plan: CBC with Differential/Platelet, Comprehensive metabolic panel, POCT Urinalysis Dipstick (Automated), POCT Urinalysis Dipstick (Automated)  LLQ abdominal pain - Plan: CBC with Differential/Platelet, Comprehensive metabolic panel, POCT Urinalysis Dipstick (Automated), POCT Urinalysis Dipstick (Automated)  Meds ordered this encounter  Medications  . amoxicillin-clavulanate (AUGMENTIN) 875-125 MG tablet    Sig: Take 1 tablet by mouth 2 (two) times daily.    Dispense:  20 tablet    Refill:  0  prior episodes of diverticulitis fully resolved. This is a new acute episode.   Return precautions advised.  Garret Reddish, MD

## 2018-09-22 NOTE — Patient Instructions (Addendum)
Health Maintenance Due  Topic Date Due  . INFLUENZA VACCINE -please call our office to schedule this in October/November. Want you healed from current episode before taking this 07/24/2018   Augmentin x 10 days for suspect diverticulitis. If you fail to improve within 48 hours of symptoms drastically worsen - seek care- if in daytime- lets see if we can set up a CT scan for you.    Diverticulitis Diverticulitis is when small pockets in your large intestine (colon) get infected or swollen. This causes stomach pain and watery poop (diarrhea). These pouches are called diverticula. They form in people who have a condition called diverticulosis. Follow these instructions at home: Medicines  Take over-the-counter and prescription medicines only as told by your doctor. These include: ? Antibiotics. ? Pain medicines. ? Fiber pills. ? Probiotics. ? Stool softeners.  Do not drive or use heavy machinery while taking prescription pain medicine.  If you were prescribed an antibiotic, take it as told. Do not stop taking it even if you feel better. General instructions  Follow a diet as told by your doctor.  When you feel better, your doctor may tell you to change your diet. You may need to eat a lot of fiber. Fiber makes it easier to poop (have bowel movements). Healthy foods with fiber include: ? Berries. ? Beans. ? Lentils. ? Green vegetables.  Exercise 3 or more times a week. Aim for 30 minutes each time. Exercise enough to sweat and make your heart beat faster.  Keep all follow-up visits as told. This is important. You may need to have an exam of the large intestine. This is called a colonoscopy. Contact a doctor if:  Your pain does not get better.  You have a hard time eating or drinking.  You are not pooping like normal. Get help right away if:  Your pain gets worse.  Your problems do not get better.  Your problems get worse very fast.  You have a fever.  You throw up  (vomit) more than one time.  You have poop that is: ? Bloody. ? Black. ? Tarry. Summary  Diverticulitis is when small pockets in your large intestine (colon) get infected or swollen.  Take medicines only as told by your doctor.  Follow a diet as told by your doctor. This information is not intended to replace advice given to you by your health care provider. Make sure you discuss any questions you have with your health care provider. Document Released: 05/28/2008 Document Revised: 12/27/2016 Document Reviewed: 12/27/2016 Elsevier Interactive Patient Education  2017 [employer].

## 2018-10-03 ENCOUNTER — Encounter: Payer: Self-pay | Admitting: Family Medicine

## 2018-10-03 ENCOUNTER — Ambulatory Visit: Payer: 59 | Admitting: Family Medicine

## 2018-10-03 VITALS — BP 130/74 | HR 88 | Temp 98.8°F | Ht 74.0 in | Wt 228.0 lb

## 2018-10-03 DIAGNOSIS — K5792 Diverticulitis of intestine, part unspecified, without perforation or abscess without bleeding: Secondary | ICD-10-CM

## 2018-10-03 MED ORDER — METRONIDAZOLE 500 MG PO TABS: 500.0000 mg | ORAL_TABLET | Freq: Three times a day (TID) | ORAL | 0 refills | Status: AC

## 2018-10-03 MED ORDER — TRAMADOL HCL 50 MG PO TABS: 50.0000 mg | ORAL_TABLET | Freq: Three times a day (TID) | ORAL | 0 refills | Status: AC | PRN

## 2018-10-03 MED ORDER — CIPROFLOXACIN HCL 500 MG PO TABS: 500.0000 mg | ORAL_TABLET | Freq: Two times a day (BID) | ORAL | 0 refills | Status: DC

## 2018-10-03 NOTE — Progress Notes (Signed)
   Subjective:  Ricardo Garcia is a 62 y.o. male who presents today for same-day appointment with a chief complaint of diverticulitis.   HPI:  Diverticulitis, Acute problem Saw his PCP 12 days ago for this. Was started on augmentin for 10 days.  He had modest improvement in symptoms however not complete resolution.  He has completed his antibiotic for the past couple days and has had worsening symptoms.  Pain mostly located in his left lower quadrant.  No fevers or chills.  No nausea or vomiting.  ROS: Per HPI  Objective:  Physical Exam: BP 130/74 (BP Location: Left Arm, Patient Position: Sitting, Cuff Size: Normal)   Pulse 88   Temp 98.8 F (37.1 C) (Oral)   Ht 6\' 2"  (1.88 m)   Wt 228 lb (103.4 kg)   SpO2 98%   BMI 29.27 kg/m   Wt Readings from Last 3 Encounters:  10/03/18 228 lb (103.4 kg)  09/22/18 225 lb 12.8 oz (102.4 kg)  04/16/18 224 lb 9.6 oz (101.9 kg)  Gen: NAD, resting comfortably CV: RRR with no murmurs appreciated Pulm: NWOB, CTAB with no crackles, wheezes, or rhonchi GI: Normal bowel sounds present.  Mild tender to palpation along left lower quadrant and right lower quadrant.  No rebound or guarding.  Assessment/Plan:  Diverticulitis Patient had modest improvement with Augmentin, however not full resolution.  Will start a course of Cipro and Flagyl as this has worked well for his prior episodes of diverticulitis.  His abdominal exam is benign-do not think we need to check a CT scan at this point.  Patient also requested pain medication.  Discussed importance of not masking worsening pain due to potential for complications.  Patient voiced understanding.  We will send in a small supply of tramadol.  Discussed strict reasons to return to care and seek emergent care.  If symptoms worsen or do not improve with Cipro and Flagyl, will need abdominal CT scan to rule out complications or other possible pathologies.  Algis Greenhouse. Jerline Pain, MD 10/03/2018 3:08 PM

## 2018-10-03 NOTE — Patient Instructions (Signed)
It was very nice to see you today!  Please star the cipro and flagyl.  Let me know or let Dr hunter know if your symptoms worsen or do not improve over the next few days.  Take care, Dr Jerline Pain   Diverticulitis Diverticulitis is when small pockets in your large intestine (colon) get infected or swollen. This causes stomach pain and watery poop (diarrhea). These pouches are called diverticula. They form in people who have a condition called diverticulosis. Follow these instructions at home: Medicines  Take over-the-counter and prescription medicines only as told by your doctor. These include: ? Antibiotics. ? Pain medicines. ? Fiber pills. ? Probiotics. ? Stool softeners.  Do not drive or use heavy machinery while taking prescription pain medicine.  If you were prescribed an antibiotic, take it as told. Do not stop taking it even if you feel better. General instructions  Follow a diet as told by your doctor.  When you feel better, your doctor may tell you to change your diet. You may need to eat a lot of fiber. Fiber makes it easier to poop (have bowel movements). Healthy foods with fiber include: ? Berries. ? Beans. ? Lentils. ? Green vegetables.  Exercise 3 or more times a week. Aim for 30 minutes each time. Exercise enough to sweat and make your heart beat faster.  Keep all follow-up visits as told. This is important. You may need to have an exam of the large intestine. This is called a colonoscopy. Contact a doctor if:  Your pain does not get better.  You have a hard time eating or drinking.  You are not pooping like normal. Get help right away if:  Your pain gets worse.  Your problems do not get better.  Your problems get worse very fast.  You have a fever.  You throw up (vomit) more than one time.  You have poop that is: ? Bloody. ? Black. ? Tarry. Summary  Diverticulitis is when small pockets in your large intestine (colon) get infected or  swollen.  Take medicines only as told by your doctor.  Follow a diet as told by your doctor. This information is not intended to replace advice given to you by your health care provider. Make sure you discuss any questions you have with your health care provider. Document Released: 05/28/2008 Document Revised: 12/27/2016 Document Reviewed: 12/27/2016 Elsevier Interactive Patient Education  2017 [employer].

## 2018-10-21 ENCOUNTER — Ambulatory Visit (INDEPENDENT_AMBULATORY_CARE_PROVIDER_SITE_OTHER): Payer: 59 | Admitting: Family Medicine

## 2018-10-21 ENCOUNTER — Encounter: Payer: Self-pay | Admitting: Family Medicine

## 2018-10-21 VITALS — BP 110/60 | HR 75 | Ht 74.0 in | Wt 226.0 lb

## 2018-10-21 DIAGNOSIS — Z125 Encounter for screening for malignant neoplasm of prostate: Secondary | ICD-10-CM

## 2018-10-21 DIAGNOSIS — Z23 Encounter for immunization: Secondary | ICD-10-CM | POA: Diagnosis not present

## 2018-10-21 DIAGNOSIS — J849 Interstitial pulmonary disease, unspecified: Secondary | ICD-10-CM

## 2018-10-21 DIAGNOSIS — Z0000 Encounter for general adult medical examination without abnormal findings: Secondary | ICD-10-CM

## 2018-10-21 DIAGNOSIS — E785 Hyperlipidemia, unspecified: Secondary | ICD-10-CM | POA: Diagnosis not present

## 2018-10-21 LAB — LIPID PANEL
CHOL/HDL RATIO: 3
Cholesterol: 166 mg/dL (ref 0–200)
HDL: 49.6 mg/dL (ref >39.00)
LDL CALC: 101 mg/dL — AB (ref 0–99)
NonHDL: 116.3
Triglycerides: 75 mg/dL (ref 0.0–149.0)
VLDL: 15 mg/dL (ref 0.0–40.0)

## 2018-10-21 LAB — COMPREHENSIVE METABOLIC PANEL
ALT: 20 U/L (ref 0–53)
AST: 16 U/L (ref 0–37)
Albumin: 4.3 g/dL (ref 3.5–5.2)
Alkaline Phosphatase: 42 U/L (ref 39–117)
BUN: 13 mg/dL (ref 6–23)
CALCIUM: 9.8 mg/dL (ref 8.4–10.5)
CO2: 29 mEq/L (ref 19–32)
Chloride: 100 mEq/L (ref 96–112)
Creatinine, Ser: 1.1 mg/dL (ref 0.40–1.50)
GFR: 71.82 mL/min (ref >60.00)
Glucose, Bld: 96 mg/dL (ref 70–99)
Potassium: 4.5 mEq/L (ref 3.5–5.1)
Sodium: 136 mEq/L (ref 135–145)
TOTAL PROTEIN: 6.7 g/dL (ref 6.0–8.3)
Total Bilirubin: 0.7 mg/dL (ref 0.2–1.2)

## 2018-10-21 LAB — CBC
HEMATOCRIT: 41.6 % (ref 39.0–52.0)
HEMOGLOBIN: 14.2 g/dL (ref 13.0–17.0)
MCHC: 34.1 g/dL (ref 30.0–36.0)
MCV: 88.5 fl (ref 78.0–100.0)
PLATELETS: 236 10*3/uL (ref 150.0–400.0)
RBC: 4.7 Mil/uL (ref 4.22–5.81)
RDW: 13.6 % (ref 11.5–15.5)
WBC: 6.4 10*3/uL (ref 4.0–10.5)

## 2018-10-21 LAB — PSA: PSA: 0.86 ng/mL (ref 0.10–4.00)

## 2018-10-21 NOTE — Patient Instructions (Addendum)
Health Maintenance Due  Topic Date Due  . INFLUENZA VACCINE -thanks for doing this today! 07/24/2018   [place] job cutting out the alcohol and with the weight loss!   Please stop by lab before you go

## 2018-10-21 NOTE — Progress Notes (Signed)
Phone: [phone]  Subjective:  Patient presents today for their annual physical. Chief complaint-noted.   See problem oriented charting- ROS- full  review of systems was completed and negative including No chest pain or shortness of breath. No headache or blurry vision.  [place]e of abdominal discomfort as noted below - eczema has been better  The following were reviewed and entered/updated in epic: Past Medical History:  Diagnosis Date  . Diverticulitis   . Hyperlipidemia   . Hypertension   . PONV (postoperative nausea and vomiting)    when he was 62 years old   Patient Active Problem List   Diagnosis Date Noted  . ILD (interstitial lung disease) ([facility]) 03/07/2017    Priority: High  . Hemoptysis 12/11/2016    Priority: High  .  PULMONARY INFILTRATES...BILATERAL LOWER LOBES 12/11/2016    Priority: High  . Lung nodules 12/11/2016    Priority: High  . Eczema 04/15/2017    Priority: Medium  . Sinusitis, chronic 07/28/2013    Priority: Medium  . Hypertension 01/26/2012    Priority: Medium  . Hyperlipidemia 01/26/2012    Priority: Medium   Past Surgical History:  Procedure Laterality Date  .  bone spur removal     2016- foot  . ARTHROTOMY Right 11/15/2015   Procedure: RIGHT ANKLE ARTHROTOMY ;  Surgeon: Melrose Nakayama, MD;  Location: [place];  Service: Orthopedics;  Laterality: Right;  . balloon septoplasty    . COLONOSCOPY  2011  . ELECTROMAGNETIC NAVIGATION BROCHOSCOPY  12/20/2016  . FOREIGN BODY REMOVAL Right 11/15/2015   Procedure: RIGHT ANKLE LOOSE BODY REMOVAL;  Surgeon: Melrose Nakayama, MD;  Location: [place];  Service: Orthopedics;  Laterality: Right;  . HERNIA REPAIR     62 years old  . LUNG BIOPSY Right 03/07/2017   Procedure: RIGHT LUNG BIOPSY;  Surgeon: Melrose Nakayama, MD;  Location: [place];  Service: Thoracic;  Laterality: Right;  . LYMPH NODE DISSECTION Right 03/07/2017   Procedure: LYMPH NODE DISSECTION, right lung;  Surgeon: Melrose Nakayama, MD;   Location: [place];  Service: Thoracic;  Laterality: Right;  . SEGMENTECOMY Right 03/07/2017   Procedure: RIGHT LOWER LOBE SUPERIOR SEGMENTECTOMY;  Surgeon: Melrose Nakayama, MD;  Location: [place];  Service: Thoracic;  Laterality: Right;  Marland Kitchen VIDEO ASSISTED THORACOSCOPY (VATS)/WEDGE RESECTION Right 03/07/2017   Procedure: RIGHT LUNG VIDEO ASSISTED THORACOSCOPY (VATS)/WEDGE RESECTION;  Surgeon: Melrose Nakayama, MD;  Location: [place];  Service: Thoracic;  Laterality: Right;    Family History  Problem Relation Age of Onset  . Hypertension Mother        uncontrolled  . Stroke Mother        72  . Other Mother        died- fell after stroke and broke ribs- "lungs filled with fluid"  . Other Father        unknown cause of death age 69  . Rheum arthritis Father   . Other Brother        some step siblingsonly    Medications- reviewed and updated Current Outpatient Medications  Medication Sig Dispense Refill  . Bromelains 500 MG TABS Take 1 tablet by mouth 2 (two) times daily.    Marland Kitchen CARTIA XT 120 MG 24 hr capsule TAKE 1 CAPSULE BY MOUTH ONCE DAILY 90 capsule 1  . cetirizine (ZYRTEC) 5 MG tablet Take 10 mg by mouth daily.    . clobetasol (TEMOVATE) 0.05 % external solution Apply 1 application topically 2 (two) times daily. 7  days maximum. For scalp 50 mL 2  . halobetasol (ULTRAVATE) 0.05 % cream Apply topically 2 (two) times daily. For eczema. 7 days maximum 50 g 2  . lisinopril (PRINIVIL,ZESTRIL) 20 MG tablet TAKE 1 TABLET BY MOUTH ONCE DAILY 90 tablet 1  . montelukast (SINGULAIR) 10 MG tablet TAKE 1 TABLET BY MOUTH ONCE DAILY AT BEDTIME 30 tablet 11  . rosuvastatin (CRESTOR) 10 MG tablet Take 1 tablet (10 mg total) by mouth daily. 30 tablet 11  . Vitamin D, Ergocalciferol, (DRISDOL) 50000 units CAPS capsule Take 50,000 Units by mouth daily.     No current facility-administered medications for this visit.     Allergies-reviewed and updated Allergies  Allergen Reactions  . Levaquin  [Levofloxacin] Other (See Comments)    MYALGIAS.Marland KitchenMarland KitchenNOTED AFTER TREATMENT 10/2016    Social History   Social History Narrative   Divorced. Lives alone. 2 children- son 64 at [place], daughter 20- [employer].       [place].    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 Oropharynx normal Neck: no thyromegaly CV: RRR no murmurs rubs or gallops Lungs: CTAB no crackles, wheeze, rhonchi Abdomen: soft/nontender/nondistended/normal bowel sounds. No rebound or guarding.  Ext: no edema Skin: warm, dry Neuro: grossly normal, moves all extremities, PERRLA  Assessment/Plan:  62 y.o. male presenting for annual physical.  Health Maintenance counseling: 1. Anticipatory guidance: Patient counseled regarding regular dental exams -q6 months, eye exams-had an exam last year for the first time in 10 years- saw again this year, wearing seatbelts.  2. Risk factor reduction:  Advised patient of need for regular exercise and diet rich and fruits and vegetables to reduce risk of heart attack and stroke. Exercise-at the gym most days last year-has kept this up. Diet-weight 235 last physical- down to 226 this year- he quit drinking and he has found this helpful.  Wt Readings from Last 3 Encounters:  10/21/18 226 lb (102.5 kg)  10/03/18 228 lb (103.4 kg)  09/22/18 225 lb 12.8 oz (102.4 kg)  3. Immunizations/screenings/ancillary studies-advised flu shot today  Immunization History  Administered Date(s) Administered  . Influenza Inj Mdck Quad Pf 10/29/2017  . Influenza Split 10/16/2012, 09/23/2013, 12/24/2016  . Influenza, Seasonal, Injecte, Preservative Fre 10/01/2015,  12/24/2016  . Influenza,inj,Quad PF,6+ Mos 10/01/2015  . Influenza-Unspecified 10/18/2014  . Pneumococcal Polysaccharide-23 02/11/2017  . Tdap 12/24/2010  . Zoster 12/24/2010  . Zoster Recombinat (Shingrix) 11/01/2017, 01/31/2018  4. Prostate cancer screening- update PSA, low risk rectal exam Lab Results  Component Value Date   PSA 0.87 08/20/2017   PSA 0.93 07/19/2016   PSA 0.71 07/26/2015   5. Colon cancer screening - follows with Dr. Benson Norway.  Colonoscopy 2011 with 10-year repeat. 6. Skin cancer screening- Dr. Ledell Peoples office as needed. advised regular sunscreen use. Denies worrisome, changing, or new skin lesions.   Status of chronic or acute concerns   Interstitial lung disease-followed by [facility] in past- Thought to be infectious that has cleared most likely. has not heard back from [facility] in the year- he thinks his slides have been lost- last  scan was good so he is going to hold off on further workup. Will be seeing [facility] Chest with one more CT scan if things look good- likely December. Thrilled asymptomatic  HTN- Controlled on lisinopril 20 mg and Cardizem 120 mg extended release. No orthostatic symptoms BP Readings from Last 3 Encounters:  10/21/18 110/60  10/03/18 130/74  09/22/18 118/76   HLD- controlled on rosuvastatin 10mg . Took off asa earlier this year. Still with some myalgieas Lab Results  Component Value Date   CHOL 222 (H) 08/20/2017   HDL 58.90 08/20/2017   LDLCALC 145 (H) 08/20/2017   TRIG 90.0 08/20/2017   CHOLHDL 4 08/20/2017   Treated for diverticulitis in September with 10 days of Augmentin but when this finished had to be treated with cipro/flagyl course.  In the past-treated in mid May with ciprofloxacin and Flagyl. Occasional twinge of 1/10 discomfort perhaps once a day. Not tender on my exam. He thinks he has had a half dozen flare ups over 5 years at least. He asks about options for treatment if recurrent- we discussed mainly surgery and he doesn't want to  pursue this.   Eczema-uses halobetasol for skin and clobetasol for scalp. Reasonably controlled - hasnt needed lately  Chronic sinusitis-follows with ENT (Dr. Javier Glazier will be seeing Dr. Leroy Sea next). Getting allergy drops under tongue. Also on singulair and antihistamine. He feels sinuses and eczema both better since cutting out alcohol  He updates me that doesn't actually think had low D but instead was taking on preventative basis- looked great last year- taking 1000 units a day. Will not repeat today  Return in about 6 months (around 04/22/2019) for follow up- or sooner if needed.  Lab/Order associations: fasting  Preventative health care  Hyperlipidemia, unspecified hyperlipidemia type - Plan: CBC, Comprehensive metabolic panel, Lipid panel  ILD (interstitial lung disease) ([place])  Need for influenza vaccination - Plan: Flu Vaccine QUAD 36+ mos IM  Screening for prostate cancer - Plan: PSA  Return precautions advised.  Garret Reddish, MD

## 2019-01-22 DIAGNOSIS — J329 Chronic sinusitis, unspecified: Secondary | ICD-10-CM | POA: Diagnosis not present

## 2019-01-22 DIAGNOSIS — J301 Allergic rhinitis due to pollen: Secondary | ICD-10-CM | POA: Diagnosis not present

## 2019-02-09 ENCOUNTER — Other Ambulatory Visit: Payer: Self-pay | Admitting: Family Medicine

## 2019-02-24 DIAGNOSIS — Z719 Counseling, unspecified: Secondary | ICD-10-CM | POA: Diagnosis not present

## 2019-03-19 ENCOUNTER — Encounter: Payer: Self-pay | Admitting: Family Medicine

## 2019-03-19 ENCOUNTER — Ambulatory Visit (INDEPENDENT_AMBULATORY_CARE_PROVIDER_SITE_OTHER): Payer: 59 | Admitting: Family Medicine

## 2019-03-19 VITALS — BP 116/69 | Ht 74.0 in | Wt 230.0 lb

## 2019-03-19 DIAGNOSIS — I10 Essential (primary) hypertension: Secondary | ICD-10-CM

## 2019-03-19 DIAGNOSIS — E785 Hyperlipidemia, unspecified: Secondary | ICD-10-CM

## 2019-03-19 DIAGNOSIS — K5792 Diverticulitis of intestine, part unspecified, without perforation or abscess without bleeding: Secondary | ICD-10-CM | POA: Diagnosis not present

## 2019-03-19 DIAGNOSIS — J324 Chronic pansinusitis: Secondary | ICD-10-CM | POA: Diagnosis not present

## 2019-03-19 MED ORDER — METRONIDAZOLE 500 MG PO TABS: 500.0000 mg | ORAL_TABLET | Freq: Three times a day (TID) | ORAL | 0 refills | Status: AC

## 2019-03-19 MED ORDER — CIPROFLOXACIN HCL 500 MG PO TABS: 500.0000 mg | ORAL_TABLET | Freq: Two times a day (BID) | ORAL | 0 refills | Status: AC

## 2019-03-19 NOTE — Assessment & Plan Note (Signed)
S:67ff singulair  - wasn't helping. Zyrtec no help. flonase no help. Allergy treatment.  A/P: He is going to continue to monitor of symptoms- does not note significant difference on/off medication

## 2019-03-19 NOTE — Progress Notes (Signed)
Phone [phone]   Subjective:  Virtual visit via Video note  Our team/I connected with Ricardo Garcia on 03/19/19 at 11:20 AM EDT by phone (patient did not have equipment for webex) and verified that I am speaking with the correct person using two identifiers.  Location patient: Work Librarian, academic provider: Garment/textile technologist, office Persons participating in the virtual visit: patient  Our team/I discussed the limitations of evaluation and management by telemedicine and the availability of in person appointments. In light of current covid-19 pandemic, patient also understands that we are trying to protect them by minimizing in office contact if at all possible.  The patient expressed consent for telemedicine visit and agreed to proceed.   ROS-no nausea or vomiting.  No diarrhea.  Does have lower abdominal pain.  Has some fatigue.  Past Medical History-  Patient Active Problem List   Diagnosis Date Noted  . ILD (interstitial lung disease) ([place]) 03/07/2017    Priority: High  . Hemoptysis 12/11/2016    Priority: High  .  PULMONARY INFILTRATES...BILATERAL LOWER LOBES 12/11/2016    Priority: High  . Lung nodules 12/11/2016    Priority: High  . Eczema 04/15/2017    Priority: Medium  . Sinusitis, chronic 07/28/2013    Priority: Medium  . Hypertension 01/26/2012    Priority: Medium  . Hyperlipidemia 01/26/2012    Priority: Medium    Medications- reviewed and updated Current Outpatient Medications  Medication Sig Dispense Refill  . Bromelains 500 MG TABS Take 1 tablet by mouth 2 (two) times daily.    . clobetasol (TEMOVATE) 0.05 % external solution Apply 1 application topically 2 (two) times daily. 7 days maximum. For scalp 50 mL 2  . diltiazem (CARDIZEM CD) 120 MG 24 hr capsule TAKE 1 CAPSULE BY MOUTH ONCE DAILY 90 capsule 0  . lisinopril (PRINIVIL,ZESTRIL) 20 MG tablet TAKE 1 TABLET BY MOUTH ONCE DAILY 90 tablet 0  . rosuvastatin (CRESTOR) 10 MG tablet Take 1 tablet (10 mg  total) by mouth daily. 30 tablet 11  . Vitamin D, Ergocalciferol, (DRISDOL) 50000 units CAPS capsule Take 50,000 Units by mouth daily.    . cetirizine (ZYRTEC) 5 MG tablet Take 10 mg by mouth daily.    . halobetasol (ULTRAVATE) 0.05 % cream Apply topically 2 (two) times daily. For eczema. 7 days maximum (Patient not taking: Reported on 03/19/2019) 50 g 2  . montelukast (SINGULAIR) 10 MG tablet TAKE 1 TABLET BY MOUTH ONCE DAILY AT BEDTIME (Patient not taking: Reported on 03/19/2019) 30 tablet 11   No current facility-administered medications for this visit.      Objective:  BP 116/69 Comment: home check  Ht 6\' 2"  (1.88 m)   Wt 230 lb (104.3 kg)   BMI 29.53 kg/m  Gen: NAD, resting comfortably Lungs: nonlabored, normal respiratory rate  Abdomen: Patient pushes over suprapubic area and reports pain- does not mention similar pain in other areas of abdomen.  No obvious guarding or rebound Skin: warm, dry, no obvious rash     Assessment and Plan   #Diverticulitis  s:  History of diverticulitis on and off for 10 years.   Had a twinge of pain a few weeks ago but then last night woke up in significant pain. Typical trajectory of his pain worsening over time.  no fever yet.  Reports pain over primarily suprapubic area.  No significant dysuria or polyuria-at least above baseline or with prior diverticulitis flares.  Pain is a mild to moderate aching sensation-worse with palpation.  No treatments yet tried.  He is failed Augmentin treatment in the past for diverticulitis.  Cipro/flagyl makes him tired. Has not had joint pain on cipro-in addition has tolerated ciprofloxacin even though he has a Levaquin allergy..  A/P: Patient appears to have recurrent diverticulitis-we will treat with Cipro and Flagyl.  He is aware to let me know if he has fever or cough-we discussed COVID-19 can sometimes present with GI symptoms (I did not self quarantine him with no fever or cough).  If he fails to improve on  the above regimen may need to evaluate in office and get labs.  #hypertension S: controlled on diltiazem 120 mg extended release, lisinopril 20 mg BP Readings from Last 3 Encounters:  03/19/19 116/69  10/21/18 110/60  10/03/18 130/74  A/P:  Stable. Continue current medications.     #hyperlipidemia S:  controlled on rosuvastatin 10 mg daily Lab Results  Component Value Date   CHOL 166 10/21/2018   HDL 49.60 10/21/2018   LDLCALC 101 (H) 10/21/2018   TRIG 75.0 10/21/2018   CHOLHDL 3 10/21/2018   A/P:  Stable. Continue current medications.     Sinusitis, chronic S:7ff singulair  - wasn't helping. Zyrtec no help. flonase no help. Allergy treatment.  A/P: He is going to continue to monitor of symptoms- does not note significant difference on/off medication   Future Appointments  Date Time Provider [place]  04/22/2019  8:00 AM Marin Olp, MD [facility]   Meds ordered this encounter  Medications  . ciprofloxacin (CIPRO) 500 MG tablet    Sig: Take 1 tablet (500 mg total) by mouth 2 (two) times daily for 10 days.    Dispense:  20 tablet    Refill:  0  . metroNIDAZOLE (FLAGYL) 500 MG tablet    Sig: Take 1 tablet (500 mg total) by mouth 3 (three) times daily for 10 days.    Dispense:  30 tablet    Refill:  0    Return precautions advised.  Garret Reddish, MD

## 2019-03-19 NOTE — Patient Instructions (Addendum)
Video visit-he knows to let us know if he has fever, chills, worsening symptoms or lack of improvement in symptoms

## 2019-03-20 ENCOUNTER — Encounter: Payer: Self-pay | Admitting: Family Medicine

## 2019-04-01 ENCOUNTER — Other Ambulatory Visit: Payer: Self-pay

## 2019-04-01 MED ORDER — LISINOPRIL 20 MG PO TABS: 20.0000 mg | ORAL_TABLET | Freq: Every day | ORAL | 0 refills | Status: DC

## 2019-04-01 MED ORDER — ROSUVASTATIN CALCIUM 10 MG PO TABS: 10.0000 mg | ORAL_TABLET | Freq: Every day | ORAL | 11 refills | Status: DC

## 2019-04-01 NOTE — Telephone Encounter (Signed)
Change pharmacy. [facility] Lisinopril 20mg  Rosuvastatin 10mg 

## 2019-04-06 ENCOUNTER — Telehealth: Payer: Self-pay | Admitting: Family Medicine

## 2019-04-06 ENCOUNTER — Other Ambulatory Visit: Payer: Self-pay

## 2019-04-06 MED ORDER — DILTIAZEM HCL ER COATED BEADS 120 MG PO CP24: 120.0000 mg | ORAL_CAPSULE | Freq: Every day | ORAL | 0 refills | Status: DC

## 2019-04-06 NOTE — Telephone Encounter (Signed)
Rx sent to pt preferred pharmacy

## 2019-04-06 NOTE — Telephone Encounter (Signed)
Copied from [place] [id number]. Topic: Quick Communication - Rx Refill/Question >> Apr 06, 2019  3:31 PM Sheran Luz wrote: Medication: diltiazem (CARDIZEM CD) 120 MG 24 hr capsule   Patient is requesting a refill of this medication.   Preferred Pharmacy (with phone number or street name): [place], [place] - [address]

## 2019-04-06 NOTE — Telephone Encounter (Signed)
See note

## 2019-04-22 ENCOUNTER — Ambulatory Visit: Payer: 59 | Admitting: Family Medicine

## 2019-04-27 DIAGNOSIS — D485 Neoplasm of uncertain behavior of skin: Secondary | ICD-10-CM | POA: Diagnosis not present

## 2019-04-27 DIAGNOSIS — L2089 Other atopic dermatitis: Secondary | ICD-10-CM | POA: Diagnosis not present

## 2019-04-27 DIAGNOSIS — D225 Melanocytic nevi of trunk: Secondary | ICD-10-CM | POA: Diagnosis not present

## 2019-04-27 DIAGNOSIS — L820 Inflamed seborrheic keratosis: Secondary | ICD-10-CM | POA: Diagnosis not present

## 2019-05-09 ENCOUNTER — Other Ambulatory Visit: Payer: Self-pay | Admitting: Family Medicine

## 2019-05-11 NOTE — Telephone Encounter (Signed)
Last OV 03/19/19 Last refill 04/01/19 #90/0 Next OV 07/01/19

## 2019-05-14 ENCOUNTER — Other Ambulatory Visit: Payer: Self-pay | Admitting: Family Medicine

## 2019-07-01 ENCOUNTER — Ambulatory Visit: Payer: 59 | Admitting: Family Medicine

## 2019-07-01 ENCOUNTER — Encounter: Payer: Self-pay | Admitting: Family Medicine

## 2019-07-01 ENCOUNTER — Other Ambulatory Visit: Payer: Self-pay

## 2019-07-01 VITALS — BP 120/80 | HR 76 | Temp 98.8°F | Ht 74.0 in | Wt 239.2 lb

## 2019-07-01 DIAGNOSIS — E669 Obesity, unspecified: Secondary | ICD-10-CM

## 2019-07-01 DIAGNOSIS — I10 Essential (primary) hypertension: Secondary | ICD-10-CM

## 2019-07-01 DIAGNOSIS — E785 Hyperlipidemia, unspecified: Secondary | ICD-10-CM | POA: Diagnosis not present

## 2019-07-01 DIAGNOSIS — J849 Interstitial pulmonary disease, unspecified: Secondary | ICD-10-CM

## 2019-07-01 MED ORDER — CLOBETASOL PROPIONATE 0.05 % EX SOLN: 1.0000 "application " | Freq: Two times a day (BID) | CUTANEOUS | 2 refills | Status: DC

## 2019-07-01 MED ORDER — HALOBETASOL PROPIONATE 0.05 % EX CREA: TOPICAL_CREAM | Freq: Two times a day (BID) | CUTANEOUS | 2 refills | Status: DC

## 2019-07-01 MED ORDER — LISINOPRIL 20 MG PO TABS: 20.0000 mg | ORAL_TABLET | Freq: Every day | ORAL | 3 refills | Status: DC

## 2019-07-01 MED ORDER — ROSUVASTATIN CALCIUM 10 MG PO TABS: 10.0000 mg | ORAL_TABLET | Freq: Every day | ORAL | 3 refills | Status: DC

## 2019-07-01 MED ORDER — DILTIAZEM HCL ER COATED BEADS 120 MG PO CP24: 120.0000 mg | ORAL_CAPSULE | Freq: Every day | ORAL | 3 refills | Status: DC

## 2019-07-01 NOTE — Patient Instructions (Addendum)
Schedule a physical after 10/21/18- up to 6 months from now would be reasonable  No labs today- will update next visit  Lets try to trim at least 10 lbs off by physical

## 2019-07-01 NOTE — Progress Notes (Signed)
Phone [phone]   Subjective:  Ricardo Garcia is a 63 y.o. year old very pleasant male patient who presents for/with See problem oriented charting Chief Complaint  Patient presents with  . Hypertension    6 month f/u    ROS- No chest pain or shortness of breath. No headache or blurry vision.    Past Medical History-  Patient Active Problem List   Diagnosis Date Noted  . ILD (interstitial lung disease) ([place]) 03/07/2017    Priority: High  . Hemoptysis 12/11/2016    Priority: High  .  PULMONARY INFILTRATES...BILATERAL LOWER LOBES 12/11/2016    Priority: High  . Lung nodules 12/11/2016    Priority: High  . Eczema 04/15/2017    Priority: Medium  . Sinusitis, chronic 07/28/2013    Priority: Medium  . Hypertension 01/26/2012    Priority: Medium  . Hyperlipidemia 01/26/2012    Priority: Medium    Medications- reviewed and updated Current Outpatient Medications  Medication Sig Dispense Refill  . Bromelains 500 MG TABS Take 1 tablet by mouth 2 (two) times daily.    . clobetasol (TEMOVATE) 0.05 % external solution Apply 1 application topically 2 (two) times daily. 7 days maximum. For scalp 50 mL 2  . diltiazem (CARDIZEM CD) 120 MG 24 hr capsule Take 1 capsule (120 mg total) by mouth daily. 90 capsule 3  . halobetasol (ULTRAVATE) 0.05 % cream Apply topically 2 (two) times daily. For eczema. 7 days maximum 50 g 2  . lisinopril (ZESTRIL) 20 MG tablet Take 1 tablet (20 mg total) by mouth daily. 90 tablet 3  . rosuvastatin (CRESTOR) 10 MG tablet Take 1 tablet (10 mg total) by mouth daily. 90 tablet 3  . Vitamin D, Ergocalciferol, (DRISDOL) 50000 units CAPS capsule Take 50,000 Units by mouth daily.     No current facility-administered medications for this visit.      Objective:  BP 120/80   Pulse 76   Temp 98.8 F (37.1 C) (Oral)   Ht 6\' 2"  (1.88 m)   Wt 239 lb 3.2 oz (108.5 kg)   SpO2 96%   BMI 30.71 kg/m  Gen: NAD, resting comfortably CV: RRR no murmurs rubs or gallops  Lungs: CTAB no crackles, wheeze, rhonchi Abdomen: soft/nontender/nondistended Ext: no edema Skin: warm, dry Neuro: grossly normal, moves all extremities    Assessment and Plan   #hypertension S: controlled on Diltiazem 120 mg and Lisinopril 20 mg daily.  BP Readings from Last 3 Encounters:  07/01/19 120/80  03/19/19 116/69  10/21/18 110/60  A/P:  Stable. Continue current medications.    #hyperlipidemia/obesity S: Mild poorly controlled on Rosuvastatin 10 mg daily.   Instead of increasing dose of statin at last physical we decided to focus on lifestyle changes- he weighed 226 at physical.up 13 lbs from physical. Feels like eating habits havent been the best. Has not been able to get in gym with covid 19 - has trie dto do some biking but not as intense as prior exercise and exercise tends to suppress his appetite so has affected him on several fronts Lab Results  Component Value Date   CHOL 166 10/21/2018   HDL 49.60 10/21/2018   LDLCALC 101 (H) 10/21/2018   TRIG 75.0 10/21/2018   CHOLHDL 3 10/21/2018   A/P: likely slightly worsened- update lipids at physical and he will work to reverse weight trend For overweight- Encouraged need for healthy eating, regular exercise, weight loss.    # ILD/Pulmonary Nodule S: Managed by [facility]  in the past- unfortunately never heard back about the slides being read at [facility]- more recently he saw [facility] chest clinic which is part of [facility] with last visit October 2018.    Fortunately he has had no further hemoptysis . persistent Cough from sinus drainage- multiple attempts for treatment in the past havent helped including with ENT.    Most recent CT scan was October 29, 2017 with the following report " IMPRESSION:  1. Stable postoperative atelectasis or scarring in the medial right lower lobe.  2. Persistent small right effusion.  3. stable remaining lung nodules.   Electronically Signed by: Delma Post  Result Narrative   COMPARISON: 05/23/2017, 12/20/2016, 11/20/2016 INDICATION: lung nodules TECHNIQUE:CT CHEST WO IV CONTRAST - 0Radiation dose reduction was utilized (automated exposure control, mA or kV adjustment based on patient size, or iterative image reconstruction). Exam date/time: 10/28/2017 4:12 PM   FINDINGS:   #Lungs:  #10 mm right lower lobe nodule has apparently been removed. Residual atelectasis or scarring present in the medial right lower lobe along with a very small effusion, little change from previous exam.  # 5 mm right middle lobe nodule image 59 lung windows, no change.  # 3 mm left lower lobe nodule image 68, no change.  # 6 mm subpleural nodule left lower lobe image 76, no change.  #Mediastinum: Heart and thoracic lymph nodes are normal in size. Major airways are patent.  #Abdomen: No relevant pathology in the visible abdominal structures.  #MSK: No acute or aggressive bone lesions.  " A/P: Stable symptoms with cough alone (but likely related to sinuses) and no hemoptysis. He declines further x-ray or CT for now- he was advised 1 final scan by [facility] chest. He is still concerned slides may have been lost by [facility] which is frustrating.   # eczema S:scalp doing well reacently and has not needed th eclobetasol solution. Still occasional flare ups on the body- sparing halobetasol works. Feels like sun helps some A/P: Stable. Continue current medications. Refilled meds   Physical 10/21/18 or later Lab/Order associations:   ICD-10-CM   1. Essential hypertension  I10   2. Hyperlipidemia, unspecified hyperlipidemia type  E78.5   3. ILD (interstitial lung disease) ([place])  J84.9   4. Obesity (BMI 30-39.9)  E66.9     Meds ordered this encounter  Medications  . diltiazem (CARDIZEM CD) 120 MG 24 hr capsule    Sig: Take 1 capsule (120 mg total) by mouth daily.    Dispense:  90 capsule    Refill:  3  . lisinopril (ZESTRIL) 20 MG tablet    Sig: Take 1 tablet (20 mg  total) by mouth daily.    Dispense:  90 tablet    Refill:  3  . rosuvastatin (CRESTOR) 10 MG tablet    Sig: Take 1 tablet (10 mg total) by mouth daily.    Dispense:  90 tablet    Refill:  3  . clobetasol (TEMOVATE) 0.05 % external solution    Sig: Apply 1 application topically 2 (two) times daily. 7 days maximum. For scalp    Dispense:  50 mL    Refill:  2  . halobetasol (ULTRAVATE) 0.05 % cream    Sig: Apply topically 2 (two) times daily. For eczema. 7 days maximum    Dispense:  50 g    Refill:  2    Return precautions advised.  Garret Reddish, MD

## 2019-07-06 ENCOUNTER — Encounter: Payer: Self-pay | Admitting: Family Medicine

## 2019-07-06 ENCOUNTER — Telehealth: Payer: Self-pay | Admitting: Family Medicine

## 2019-07-06 ENCOUNTER — Other Ambulatory Visit: Payer: Self-pay

## 2019-07-06 ENCOUNTER — Ambulatory Visit: Payer: 59 | Admitting: Family Medicine

## 2019-07-06 VITALS — BP 128/78 | HR 80 | Temp 98.1°F | Ht 74.0 in | Wt 238.5 lb

## 2019-07-06 DIAGNOSIS — I493 Ventricular premature depolarization: Secondary | ICD-10-CM | POA: Insufficient documentation

## 2019-07-06 DIAGNOSIS — R002 Palpitations: Secondary | ICD-10-CM

## 2019-07-06 NOTE — Progress Notes (Signed)
Subjective:     Patient ID: Ricardo Garcia, male   DOB: September 28, 1956, 63 y.o.   MRN: [id number]  HPI   Patient is seen to evaluate for intermittent palpitations.  These seem to occur more late in the day and sometimes at night.  No clear provoking features.  He noticed especially onset about 2 to 3 weeks ago.  Sensation of "skipped beats".  He sometimes noted a pattern with 2 beats normally with every third beat irregular.  He exercises with cycling frequently 45 minutes a day fairly vigorously and has never had any exercise intolerance.  No recent chest pains.  He had some cramping yesterday and fatigue which he thought was secondary probably to some dehydration.  He does drink usually a cup of coffee in the morning and one Coke Zero.  Usually 1 to 2 glasses of wine at night.  He does have hypertension treated with amlodipine and diltiazem.  Blood pressures been fairly well controlled.  No diuretic use.  He had echocardiogram as part of interstitial lung disease work-up back in 2015 and this apparently showed no valvular issues or other structural heart problems  Past Medical History:  Diagnosis Date  . Diverticulitis   . Hyperlipidemia   . Hypertension   . PONV (postoperative nausea and vomiting)    when he was 63 years old   Past Surgical History:  Procedure Laterality Date  .  bone spur removal     2016- foot  . ARTHROTOMY Right 11/15/2015   Procedure: RIGHT ANKLE ARTHROTOMY ;  Surgeon: Melrose Nakayama, MD;  Location: [place];  Service: Orthopedics;  Laterality: Right;  . balloon septoplasty    . COLONOSCOPY  2011  . ELECTROMAGNETIC NAVIGATION BROCHOSCOPY  12/20/2016  . FOREIGN BODY REMOVAL Right 11/15/2015   Procedure: RIGHT ANKLE LOOSE BODY REMOVAL;  Surgeon: Melrose Nakayama, MD;  Location: [place];  Service: Orthopedics;  Laterality: Right;  . HERNIA REPAIR     63 years old  . LUNG BIOPSY Right 03/07/2017   Procedure: RIGHT LUNG BIOPSY;  Surgeon: Melrose Nakayama, MD;  Location: [place];  Service: Thoracic;  Laterality: Right;  . LYMPH NODE DISSECTION Right 03/07/2017   Procedure: LYMPH NODE DISSECTION, right lung;  Surgeon: Melrose Nakayama, MD;  Location: [place];  Service: Thoracic;  Laterality: Right;  . SEGMENTECOMY Right 03/07/2017   Procedure: RIGHT LOWER LOBE SUPERIOR SEGMENTECTOMY;  Surgeon: Melrose Nakayama, MD;  Location: [place];  Service: Thoracic;  Laterality: Right;  Marland Kitchen VIDEO ASSISTED THORACOSCOPY (VATS)/WEDGE RESECTION Right 03/07/2017   Procedure: RIGHT LUNG VIDEO ASSISTED THORACOSCOPY (VATS)/WEDGE RESECTION;  Surgeon: Melrose Nakayama, MD;  Location: [place];  Service: Thoracic;  Laterality: Right;    reports that he has never smoked. He has never used smokeless tobacco. He reports that he does not drink alcohol or use drugs. family history includes Hypertension in his mother; Other in his brother, father, and mother; Rheum arthritis in his father; Stroke in his mother. Allergies  Allergen Reactions  . Levaquin [Levofloxacin] Other (See Comments)    MYALGIAS.Marland KitchenMarland KitchenNOTED AFTER TREATMENT 10/2016     Review of Systems  Constitutional: Negative for appetite change, fatigue, fever and unexpected weight change.  Eyes: Negative for visual disturbance.  Respiratory: Negative for cough, chest tightness and shortness of breath.   Cardiovascular: Positive for palpitations. Negative for chest pain and leg swelling.  Neurological: Negative for dizziness, syncope, weakness, light-headedness and headaches.       Objective:   Physical Exam  Constitutional:      Appearance: Normal appearance.  Neck:     Musculoskeletal: Neck supple.  Cardiovascular:     Rate and Rhythm: Normal rate.     Comments: Only rare premature beat auscultated Pulmonary:     Effort: Pulmonary effort is normal.     Breath sounds: Normal breath sounds.  Musculoskeletal:     Right lower leg: No edema.     Left lower leg: No edema.  Neurological:     General: No focal deficit present.      Mental Status: He is alert.        Assessment:     Intermittent palpitations.  Patient has occasional PVCs on tracing today.  Does not have any worrisome symptoms such as syncope, dizziness, chest pain    Plan:     -We discussed potential triggers for PVCs especially alcohol and caffeine and is encouraged to scale back those. We discussed possible beta-blocker use but at this point he wishes to defer.  He had taken beta-blockers once before but had some fatigue issues -Follow-up immediately with Dr. Yong Channel or me if he develops any chest pain or other new symptoms such as extreme dizziness or syncope -Check basic metabolic panel and magnesium level  Eulas Post MD [facility] at [facility]

## 2019-07-06 NOTE — Telephone Encounter (Signed)
After speaking to Dr. Volanda Napoleon CMA, I was told she does not work in office on Mondays only Wednesdays so she would not be able to be an option. When patient calls back the front will need to reach out to [facility] for another option if available or another [place] office.

## 2019-07-06 NOTE — Telephone Encounter (Signed)
Noted  

## 2019-07-06 NOTE — Telephone Encounter (Signed)
Please contact patient to advise as soon as possible.   Copied from [place] [phone]. Topic: General - Other >> Jul 06, 2019  9:31 AM Leward Quan A wrote: Reason for CRM: Patient called to say that he is having some issues with irregular heartbeats. States that there are no other symptoms just that the beats are irregular and he is concerned. Would like a call back to discuss where to go from here. He want to know if there is something that can be done for this issue. Please call Ph# [phone]

## 2019-07-06 NOTE — Telephone Encounter (Signed)
Left the patient a voicemail to call back to schedule.

## 2019-07-06 NOTE — Telephone Encounter (Signed)
I was able to get in contact with the patient calling a second time. He is scheduled to see Dr. Elease Hashimoto today at 2:15 for an in office visit. I confirmed the appointment with Joline Salt.   No further action required at this time.

## 2019-07-06 NOTE — Patient Instructions (Signed)
Premature Ventricular Contraction  A premature ventricular contraction (PVC) is a common kind of irregular heartbeat (arrhythmia). These contractions are extra heartbeats that start in the ventricles of the heart and occur too early in the normal sequence. During the PVC, the heart's normal electrical pathway is not used, so the beat is shorter and less effective. In most cases, these contractions come and go and do not require treatment. What are the causes? Common causes of the condition include:  Smoking.  Drinking alcohol.  Certain medicines.  Some illegal drugs.  Stress.  Caffeine. Certain medical conditions can also cause PVCs:  Heart failure.  Heart attack, or coronary artery disease.  Heart valve problems.  Changes in minerals in the blood (electrolytes).  Low blood oxygen levels or high carbon dioxide levels. In many cases, the cause of this condition is not known. What are the signs or symptoms? The main symptom of this condition is fast or skipped heartbeats (palpitations). Other symptoms include:  Chest pain.  Shortness of breath.  Feeling tired.  Dizziness.  Difficulty exercising. In some cases, there are no symptoms. How is this diagnosed? This condition may be diagnosed based on:  Your medical history.  A physical exam. During the exam, the health care provider will check for irregular heartbeats.  Tests, such as: ? An ECG (electrocardiogram) to monitor the electrical activity of your heart. ? An ambulatory cardiac monitor. This device records your heartbeats for 24 hours or more. ? Stress tests to see how exercise affects your heart rhythm and blood supply. ? An echocardiogram. This test uses sound waves (ultrasound) to produce an image of your heart. ? An electrophysiology study (EPS). This test checks for electrical problems in your heart. How is this treated? Treatment for this condition depends on any underlying conditions, the type of PVCs  that you are having, and how much the symptoms are interfering with your daily life. Possible treatments include:  Avoiding things that cause premature contractions (triggers). These include caffeine and alcohol.  Taking medicines if symptoms are severe or if the extra heartbeats are frequent.  Getting treatment for underlying conditions that cause PVCs.  Having an implantable cardioverter defibrillator (ICD), if you are at risk for a serious arrhythmia. The ICD is a small device that is inserted into your chest to monitor your heartbeat. When it senses an irregular heartbeat, it sends a shock to bring the heartbeat back to normal.  Having a procedure to destroy the portion of the heart tissue that sends out abnormal signals (catheter ablation). In some cases, no treatment is required. Follow these instructions at home: Lifestyle  Do not use any products that contain nicotine or tobacco, such as cigarettes, e-cigarettes, and chewing tobacco. If you need help quitting, ask your health care provider.  Do not use illegal drugs.  Exercise regularly. Ask your health care provider what type of exercise is safe for you.  Try to get at least 7-9 hours of sleep each night, or as much as recommended by your health care provider.  Find healthy ways to manage stress. Avoid stressful situations when possible. Alcohol use  Do not drink alcohol if: ? Your health care provider tells you not to drink. ? You are pregnant, may be pregnant, or are planning to become pregnant. ? Alcohol triggers your episodes.  If you drink alcohol: ? Limit how much you use to:  0-1 drink a day for women.  0-2 drinks a day for men.  Be aware of how much   alcohol is in your drink. In the [place] one drink equals one 12 oz bottle of beer (355 mL), one 5 oz glass of wine (148 mL), or one 1 oz glass of hard liquor (44 mL). General instructions  Take over-the-counter and prescription medicines only as told by your  health care provider.  If caffeine triggers episodes of PVC, do not eat, drink, or use anything with caffeine in it.  Keep all follow-up visits as told by your health care provider. This is important. Contact a health care provider if you:  Feel palpitations. Get help right away if you:  Have chest pain.  Have shortness of breath.  Have sweating for no reason.  Have nausea and vomiting.  Become light-headed or you faint. Summary  A premature ventricular contraction (PVC) is a common kind of irregular heartbeat (arrhythmia).  In most cases, these contractions come and go and do not require treatment.  You may need to wear an ambulatory cardiac monitor. This records your heartbeats for 24 hours or more.  Treatment depends on any underlying conditions, the type of PVCs that you are having, and how much the symptoms are interfering with your daily life. This information is not intended to replace advice given to you by your health care provider. Make sure you discuss any questions you have with your health care provider. Document Released: 07/27/2004 Document Revised: 09/04/2018 Document Reviewed: 09/04/2018 Elsevier Patient Education  2020 Elsevier Inc.  

## 2019-07-06 NOTE — Telephone Encounter (Signed)
Called and spoke with patient. Sx have been intermittent over the past 3 weeks. Sx seem to be worse at night. He c/o irregular heart beat but denies CP, SOB, neck/back/UE pain. He does get slightly light-headed when he stands while experiencing the irregular heart beat. Pt agreeable to see a different provider as Dr. Ansel Bong schedule is full.   Marcene Brawn, will you see about getting pt scheduled with Dr. Volanda Napoleon at [facility] please.

## 2019-07-07 LAB — MAGNESIUM: Magnesium: 2.1 mg/dL (ref 1.5–2.5)

## 2019-07-07 LAB — BASIC METABOLIC PANEL
BUN: 14 mg/dL (ref 6–23)
CO2: 28 mEq/L (ref 19–32)
Calcium: 9.3 mg/dL (ref 8.4–10.5)
Chloride: 103 mEq/L (ref 96–112)
Creatinine, Ser: 1.07 mg/dL (ref 0.40–1.50)
GFR: 69.6 mL/min (ref >60.00)
Glucose, Bld: 98 mg/dL (ref 70–99)
Potassium: 4.8 mEq/L (ref 3.5–5.1)
Sodium: 138 mEq/L (ref 135–145)

## 2019-07-08 ENCOUNTER — Telehealth: Payer: Self-pay

## 2019-07-08 DIAGNOSIS — I10 Essential (primary) hypertension: Secondary | ICD-10-CM

## 2019-07-08 DIAGNOSIS — I493 Ventricular premature depolarization: Secondary | ICD-10-CM

## 2019-07-08 DIAGNOSIS — R002 Palpitations: Secondary | ICD-10-CM

## 2019-07-08 NOTE — Telephone Encounter (Signed)
Copied from [place] [phone]. Topic: Medical Record Request - Other >> Jul 08, 2019 10:52 AM Burchel, Abbi R wrote: Pt requesting most recent labs/ekg/OV notes be sent to [facility] (Dr Elonda Husky)  Pt has appt 07/13/2019  Fax: [phone]

## 2019-07-08 NOTE — Telephone Encounter (Signed)
Referral has been placed and information has been faxed. Called pt and advised.

## 2019-07-27 ENCOUNTER — Encounter: Payer: Self-pay | Admitting: Family Medicine

## 2019-07-29 ENCOUNTER — Telehealth: Payer: Self-pay

## 2019-07-29 NOTE — Telephone Encounter (Signed)
Called pt and left VM to call the office.  

## 2019-07-29 NOTE — Telephone Encounter (Signed)
Pt called back. °

## 2019-07-29 NOTE — Telephone Encounter (Signed)
Called pt and advised that we would be happy to place the order, just need to know which facility he plans to have labs drawn at. He said that he thinks he can donate blood and have it checked. He will call back if he needs order faxed.

## 2019-07-29 NOTE — Telephone Encounter (Signed)
Patient returning call.

## 2019-07-29 NOTE — Telephone Encounter (Signed)
Copied from [place] [phone]. Topic: General - Inquiry >> Jul 29, 2019  9:47 AM Richardo Priest, NT wrote: Reason for CRM: Patient is wanting an antibody test for COVID due to having palpitations 6 weeks ago and being advised by friend who is RN to get test done. Please advise. Call back is [phone]

## 2019-10-23 ENCOUNTER — Encounter: Payer: Self-pay | Admitting: Family Medicine

## 2019-10-23 ENCOUNTER — Ambulatory Visit (INDEPENDENT_AMBULATORY_CARE_PROVIDER_SITE_OTHER): Payer: 59 | Admitting: Family Medicine

## 2019-10-23 ENCOUNTER — Encounter: Payer: 59 | Admitting: Family Medicine

## 2019-10-23 ENCOUNTER — Other Ambulatory Visit: Payer: Self-pay

## 2019-10-23 VITALS — Ht 74.0 in | Wt 238.0 lb

## 2019-10-23 DIAGNOSIS — Z20822 Contact with and (suspected) exposure to covid-19: Secondary | ICD-10-CM

## 2019-10-23 DIAGNOSIS — R0981 Nasal congestion: Secondary | ICD-10-CM

## 2019-10-23 DIAGNOSIS — Z0000 Encounter for general adult medical examination without abnormal findings: Secondary | ICD-10-CM

## 2019-10-23 DIAGNOSIS — E785 Hyperlipidemia, unspecified: Secondary | ICD-10-CM

## 2019-10-23 DIAGNOSIS — Z125 Encounter for screening for malignant neoplasm of prostate: Secondary | ICD-10-CM | POA: Diagnosis not present

## 2019-10-23 DIAGNOSIS — I10 Essential (primary) hypertension: Secondary | ICD-10-CM

## 2019-10-23 DIAGNOSIS — I493 Ventricular premature depolarization: Secondary | ICD-10-CM

## 2019-10-23 MED ORDER — AMOXICILLIN-POT CLAVULANATE 875-125 MG PO TABS: 1.0000 | ORAL_TABLET | Freq: Two times a day (BID) | ORAL | 0 refills | Status: DC

## 2019-10-23 NOTE — Progress Notes (Signed)
Phone [phone]  Subjective:  Virtual visit via Video note. Chief complaint: Chief Complaint  Patient presents with  . Sinusitis     This visit type was conducted due to national recommendations for restrictions regarding the COVID-19 Pandemic (e.g. social distancing).  This format is felt to be most appropriate for this patient at this time balancing risks to patient and risks to population by having him in for in person visit.  No physical exam was performed (except for noted visual exam or audio findings with Telehealth visits).    Our team/I connected with Ricardo Garcia at  8:20 AM EDT by a video enabled telemedicine application (doxy.me or caregility through epic) and verified that I am speaking with the correct person using two identifiers.  Patient was able to see me.  Unfortunately I was not able to see him despite attempts Location patient: [place] Location provider: [facility], office Persons participating in the virtual visit:  patient  Our team/I discussed the limitations of evaluation and management by telemedicine and the availability of in person appointments. In light of current covid-19 pandemic, patient also understands that we are trying to protect them by minimizing in office contact if at all possible.  The patient expressed consent for telemedicine visit and agreed to proceed. Patient understands insurance will be billed.   ROS-no fever/chills.  Does have some cough/nasal congestion.  Past Medical History-  Patient Active Problem List   Diagnosis Date Noted  . ILD (interstitial lung disease) ([place]) 03/07/2017    Priority: High  . Hemoptysis 12/11/2016    Priority: High  .  PULMONARY INFILTRATES...BILATERAL LOWER LOBES 12/11/2016    Priority: High  . Lung nodules 12/11/2016    Priority: High  . Eczema 04/15/2017    Priority: Medium  . Sinusitis, chronic 07/28/2013    Priority: Medium  . Hypertension 01/26/2012    Priority: Medium  . Hyperlipidemia  01/26/2012    Priority: Medium  . PVC's (premature ventricular contractions) 07/06/2019    Medications- reviewed and updated Current Outpatient Medications  Medication Sig Dispense Refill  . clobetasol (TEMOVATE) 0.05 % external solution Apply 1 application topically 2 (two) times daily. 7 days maximum. For scalp 50 mL 2  . diltiazem (CARDIZEM CD) 120 MG 24 hr capsule Take 1 capsule (120 mg total) by mouth daily. 90 capsule 3  . halobetasol (ULTRAVATE) 0.05 % cream Apply topically 2 (two) times daily. For eczema. 7 days maximum 50 g 2  . lisinopril (ZESTRIL) 20 MG tablet Take 1 tablet (20 mg total) by mouth daily. 90 tablet 3  . rosuvastatin (CRESTOR) 10 MG tablet Take 1 tablet (10 mg total) by mouth daily. 90 tablet 3  . Vitamin D, Ergocalciferol, (DRISDOL) 50000 units CAPS capsule Take 50,000 Units by mouth daily.    Marland Kitchen amoxicillin-clavulanate (AUGMENTIN) 875-125 MG tablet Take 1 tablet by mouth 2 (two) times daily. 20 tablet 0  . Bromelains 500 MG TABS Take 1 tablet by mouth 2 (two) times daily.     No current facility-administered medications for this visit.      Objective:  Ht 6\' 2"  (1.88 m)   Wt 238 lb (108 kg)   BMI 30.56 kg/m  self reported vitals Gen: NAD, resting comfortably     Assessment and Plan   # Chronic sinusitis with recent worsening S: Patient was scheduled for CPE in office today. We have changed to virtual. He has history of sinus infections in the past. He is currently having some congestion and  runny nose. No fever or change in taste or smell.   Top of teeth sensitive, fatigued after eating in particular. Some variability of sinus discharge. Significantly worsened 7-10 days ago and has worsening sinus pressure and coughing up some sputum A/P: Appears to have worsening of chronic sinusitis-we will treat with Augmentin x10 days.  We will also have him COVID-19 tested. Staff will call to give self quarantine guidelines until negative test.    -Patient will  schedule fasting labs before physical on Tuesday.  I will order those labs as below. -Since has had ongoing sinus congestion he also wanted to do Covid antibody testing when he comes back for labs  Recommended follow up: Scheduled Tuesday for physical Future Appointments  Date Time Provider [place]  10/27/2019  8:30 AM [facility]  10/27/2019  2:20 PM Marin Olp, MD [facility]    Lab/Order associations:   ICD-10-CM   1. Essential hypertension  I10 CBC with Differential/Platelet    Comprehensive metabolic panel    Lipid panel    POCT Urinalysis Dipstick (Automated)  2. Hyperlipidemia, unspecified hyperlipidemia type  E78.5 CBC with Differential/Platelet    Comprehensive metabolic panel    Lipid panel    POCT Urinalysis Dipstick (Automated)  3. Screening for prostate cancer  Z12.5 PSA  4. Preventative health care  Z00.00 CBC with Differential/Platelet    Comprehensive metabolic panel    Lipid panel    PSA    POCT Urinalysis Dipstick (Automated)  5. Sinus congestion  R09.81 SAR CoV2 Serology (COVID 19)AB(IGG)IA    Novel Coronavirus, NAA ([facility])  6. PVC's (premature ventricular contractions)  I49.3     Meds ordered this encounter  Medications  . amoxicillin-clavulanate (AUGMENTIN) 875-125 MG tablet    Sig: Take 1 tablet by mouth 2 (two) times daily.    Dispense:  20 tablet    Refill:  0   Return precautions advised.  Garret Reddish, MD

## 2019-10-23 NOTE — Patient Instructions (Signed)
There are no preventive care reminders to display for this patient.  - Flu shot today - High dose flu shot today - declines for this season - will complete later in flu season (please let us know if you get this at another location so we can update your chart)

## 2019-10-24 LAB — NOVEL CORONAVIRUS, NAA: SARS-CoV-2, NAA: NOT DETECTED

## 2019-10-27 ENCOUNTER — Encounter: Payer: Self-pay | Admitting: Family Medicine

## 2019-10-27 ENCOUNTER — Other Ambulatory Visit (INDEPENDENT_AMBULATORY_CARE_PROVIDER_SITE_OTHER): Payer: 59

## 2019-10-27 ENCOUNTER — Ambulatory Visit (INDEPENDENT_AMBULATORY_CARE_PROVIDER_SITE_OTHER): Payer: 59 | Admitting: Family Medicine

## 2019-10-27 ENCOUNTER — Other Ambulatory Visit: Payer: Self-pay

## 2019-10-27 VITALS — BP 128/72 | HR 76 | Temp 98.0°F | Ht 74.0 in | Wt 244.0 lb

## 2019-10-27 DIAGNOSIS — Z0000 Encounter for general adult medical examination without abnormal findings: Secondary | ICD-10-CM

## 2019-10-27 DIAGNOSIS — Z125 Encounter for screening for malignant neoplasm of prostate: Secondary | ICD-10-CM

## 2019-10-27 DIAGNOSIS — R0981 Nasal congestion: Secondary | ICD-10-CM

## 2019-10-27 DIAGNOSIS — E785 Hyperlipidemia, unspecified: Secondary | ICD-10-CM

## 2019-10-27 DIAGNOSIS — I10 Essential (primary) hypertension: Secondary | ICD-10-CM

## 2019-10-27 DIAGNOSIS — J324 Chronic pansinusitis: Secondary | ICD-10-CM

## 2019-10-27 DIAGNOSIS — J849 Interstitial pulmonary disease, unspecified: Secondary | ICD-10-CM

## 2019-10-27 LAB — LIPID PANEL
Cholesterol: 160 mg/dL (ref 0–200)
HDL: 58.7 mg/dL (ref >39.00)
LDL Cholesterol: 78 mg/dL (ref 0–99)
NonHDL: 101.14
Total CHOL/HDL Ratio: 3
Triglycerides: 118 mg/dL (ref 0.0–149.0)
VLDL: 23.6 mg/dL (ref 0.0–40.0)

## 2019-10-27 LAB — CBC WITH DIFFERENTIAL/PLATELET
Basophils Absolute: 0.1 10*3/uL (ref 0.0–0.1)
Basophils Relative: 0.9 % (ref 0.0–3.0)
Eosinophils Absolute: 0.2 10*3/uL (ref 0.0–0.7)
Eosinophils Relative: 2.8 % (ref 0.0–5.0)
HCT: 44.1 % (ref 39.0–52.0)
Hemoglobin: 14.8 g/dL (ref 13.0–17.0)
Lymphocytes Relative: 15.9 % (ref 12.0–46.0)
Lymphs Abs: 1.1 10*3/uL (ref 0.7–4.0)
MCHC: 33.4 g/dL (ref 30.0–36.0)
MCV: 92.6 fl (ref 78.0–100.0)
Monocytes Absolute: 0.6 10*3/uL (ref 0.1–1.0)
Monocytes Relative: 8.9 % (ref 3.0–12.0)
Neutro Abs: 5.1 10*3/uL (ref 1.4–7.7)
Neutrophils Relative %: 71.5 % (ref 43.0–77.0)
Platelets: 213 10*3/uL (ref 150.0–400.0)
RBC: 4.77 Mil/uL (ref 4.22–5.81)
RDW: 13.9 % (ref 11.5–15.5)
WBC: 7.1 10*3/uL (ref 4.0–10.5)

## 2019-10-27 LAB — COMPREHENSIVE METABOLIC PANEL
ALT: 22 U/L (ref 0–53)
AST: 20 U/L (ref 0–37)
Albumin: 4.4 g/dL (ref 3.5–5.2)
Alkaline Phosphatase: 51 U/L (ref 39–117)
BUN: 10 mg/dL (ref 6–23)
CO2: 29 mEq/L (ref 19–32)
Calcium: 9.8 mg/dL (ref 8.4–10.5)
Chloride: 102 mEq/L (ref 96–112)
Creatinine, Ser: 1.08 mg/dL (ref 0.40–1.50)
GFR: 68.79 mL/min (ref >60.00)
Glucose, Bld: 105 mg/dL — ABNORMAL HIGH (ref 70–99)
Potassium: 4.4 mEq/L (ref 3.5–5.1)
Sodium: 138 mEq/L (ref 135–145)
Total Bilirubin: 0.5 mg/dL (ref 0.2–1.2)
Total Protein: 6.8 g/dL (ref 6.0–8.3)

## 2019-10-27 LAB — POC URINALSYSI DIPSTICK (AUTOMATED)
Bilirubin, UA: NEGATIVE
Blood, UA: NEGATIVE
Glucose, UA: NEGATIVE
Ketones, UA: NEGATIVE
Leukocytes, UA: NEGATIVE
Nitrite, UA: NEGATIVE
Protein, UA: NEGATIVE
Spec Grav, UA: 1.025 (ref 1.010–1.025)
Urobilinogen, UA: 0.2 E.U./dL
pH, UA: 6 (ref 5.0–8.0)

## 2019-10-27 LAB — PSA: PSA: 0.8 ng/mL (ref 0.10–4.00)

## 2019-10-27 MED ORDER — SILDENAFIL CITRATE 20 MG PO TABS: ORAL_TABLET | ORAL | 3 refills | Status: DC

## 2019-10-27 NOTE — Progress Notes (Signed)
Phone: [phone]   Subjective:  Patient presents today for their annual physical. Chief complaint-noted.   See problem oriented charting- ROS- full  review of systems was completed and negative  except for: Congestion , runny nose and tinnitus  The following were reviewed and entered/updated in epic: Past Medical History:  Diagnosis Date  . Diverticulitis   . Hyperlipidemia   . Hypertension   . PONV (postoperative nausea and vomiting)    when he was 63 years old   Patient Active Problem List   Diagnosis Date Noted  . ILD (interstitial lung disease) ([place]) 03/07/2017    Priority: High  . Hemoptysis 12/11/2016    Priority: High  .  PULMONARY INFILTRATES...BILATERAL LOWER LOBES 12/11/2016    Priority: High  . Lung nodules 12/11/2016    Priority: High  . PVC's (premature ventricular contractions) 07/06/2019    Priority: Medium  . Eczema 04/15/2017    Priority: Medium  . Sinusitis, chronic 07/28/2013    Priority: Medium  . Hypertension 01/26/2012    Priority: Medium  . Hyperlipidemia 01/26/2012    Priority: Medium   Past Surgical History:  Procedure Laterality Date  .  bone spur removal     2016- foot  . ARTHROTOMY Right 11/15/2015   Procedure: RIGHT ANKLE ARTHROTOMY ;  Surgeon: Melrose Nakayama, MD;  Location: [place];  Service: Orthopedics;  Laterality: Right;  . balloon septoplasty    . COLONOSCOPY  2011  . ELECTROMAGNETIC NAVIGATION BROCHOSCOPY  12/20/2016  . FOREIGN BODY REMOVAL Right 11/15/2015   Procedure: RIGHT ANKLE LOOSE BODY REMOVAL;  Surgeon: Melrose Nakayama, MD;  Location: [facility];  Service: Orthopedics;  Laterality: Right;  . HERNIA REPAIR     63 years old  . LUNG BIOPSY Right 03/07/2017   Procedure: RIGHT LUNG BIOPSY;  Surgeon: Melrose Nakayama, MD;  Location: [place] [facility];  Service: Thoracic;  Laterality: Right;  . LYMPH NODE DISSECTION Right 03/07/2017   Procedure: LYMPH NODE DISSECTION, right lung;  Surgeon: Melrose Nakayama, MD;  Location: [place] [place];   Service: Thoracic;  Laterality: Right;  . SEGMENTECOMY Right 03/07/2017   Procedure: RIGHT LOWER LOBE SUPERIOR SEGMENTECTOMY;  Surgeon: Melrose Nakayama, MD;  Location: [place];  Service: Thoracic;  Laterality: Right;  Marland Kitchen VIDEO ASSISTED THORACOSCOPY (VATS)/WEDGE RESECTION Right 03/07/2017   Procedure: RIGHT LUNG VIDEO ASSISTED THORACOSCOPY (VATS)/WEDGE RESECTION;  Surgeon: Melrose Nakayama, MD;  Location: [place];  Service: Thoracic;  Laterality: Right;    Family History  Problem Relation Age of Onset  . Hypertension Mother        uncontrolled  . Stroke Mother        61  . Other Mother        died- fell after stroke and broke ribs- "lungs filled with fluid"  . Other Father        unknown cause of death age 36  . Rheum arthritis Father   . Other Brother        some step siblingsonly    Medications- reviewed and updated Current Outpatient Medications  Medication Sig Dispense Refill  . amoxicillin-clavulanate (AUGMENTIN) 875-125 MG tablet Take 1 tablet by mouth 2 (two) times daily. 20 tablet 0  . Bromelains 500 MG TABS Take 1 tablet by mouth 2 (two) times daily.    . clobetasol (TEMOVATE) 0.05 % external solution Apply 1 application topically 2 (two) times daily. 7 days maximum. For scalp 50 mL 2  . diltiazem (CARDIZEM CD) 120 MG 24 hr capsule Take 1  capsule (120 mg total) by mouth daily. 90 capsule 3  . halobetasol (ULTRAVATE) 0.05 % cream Apply topically 2 (two) times daily. For eczema. 7 days maximum 50 g 2  . lisinopril (ZESTRIL) 20 MG tablet Take 1 tablet (20 mg total) by mouth daily. 90 tablet 3  . rosuvastatin (CRESTOR) 10 MG tablet Take 1 tablet (10 mg total) by mouth daily. 90 tablet 3  . sildenafil (REVATIO) 20 MG tablet Take 1-5 tablets as needed once every 48 hours for erectile dysfunction 25 tablet 3   No current facility-administered medications for this visit.     Allergies-reviewed and updated Allergies  Allergen Reactions  . Levaquin [Levofloxacin] Other (See  Comments)    MYALGIAS.Marland KitchenMarland KitchenNOTED AFTER TREATMENT 10/2016    Social History   Social History Narrative   Divorced. Lives alone. 2 children- son 22 at [place], daughter 11- [employer].       [place].    [place]- works in [employer]: gym 4-5 days a week, guitar, time with friends and family   Objective  Objective:  BP 128/72   Pulse 76   Temp 98 F (36.7 C)   Ht 6\' 2"  (1.88 m)   Wt 244 lb (110.7 kg)   SpO2 95%   BMI 31.33 kg/m  Gen: NAD, resting comfortably HEENT: Mask not removed due to covid 19. TM normal. Bridge of nose normal. Eyelids normal.  Neck: no thyromegaly or cervical lymphadenopathy  CV: RRR no murmurs rubs or gallops Lungs: CTAB no crackles, wheeze, rhonchi Abdomen: soft/nontender/nondistended/normal bowel sounds. No rebound or guarding.  Ext: no edema Skin: warm, dry Neuro: grossly normal, moves all extremities, PERRLA   Assessment and Plan  63 y.o. male presenting for annual physical.  Health Maintenance counseling: 1. Anticipatory guidance: Patient counseled regarding regular dental exams yes q6 months, eye exams yes yearly,  avoiding smoking and second hand smoke , limiting alcohol to 2 beverages per day or less- far under this 2. Risk factor reduction:  Advised patient of need for regular exercise and diet rich and fruits and vegetables to reduce risk of heart attack and stroke. Exercise- not too bad- just back in gym in last 3 weeks- back up to 4 days a week before sinuses started bothering him. Diet-pt cooks.  From last physical weight up 18 pounds- he feels main issue was being out of gym for 6 months.   Wt Readings from Last 3 Encounters:  10/27/19 244 lb (110.7 kg)  10/23/19 238 lb (108 kg)  07/06/19 238 lb 8 oz (108.2 kg)  3. Immunizations/screenings/ancillary studies-flu shot in [place].  Otherwise up-to-date Immunization History  Administered Date(s) Administered  . Influenza Inj Mdck Quad Pf  10/29/2017  . Influenza Split 10/16/2012, 09/23/2013, 12/24/2016  . Influenza, Seasonal, Injecte, Preservative Fre 10/01/2015, 12/24/2016  . Influenza,inj,Quad PF,6+ Mos 10/01/2015, 10/21/2018, 09/14/2019  . Influenza-Unspecified 10/18/2014  . Pneumococcal Polysaccharide-23 02/11/2017  . Tdap 12/24/2010  . Zoster 12/24/2010  . Zoster Recombinat (Shingrix) 11/01/2017, 01/31/2018  4. Prostate cancer screening- low risk PSA trend-can defer rectal exam Lab Results  Component Value Date   PSA 0.80 10/27/2019   PSA 0.86 10/21/2018   PSA 0.87 08/20/2017   5. Colon cancer screening - colonoscopy January 19, 2010 with 10-year follow-up planned-sees Dr. Benson [place] 6. Skin cancer screening-[facility] in the past- went to [facility] once as well. advised regular sunscreen use. Denies worrisome, changing, or new skin lesions.  7.  never smoker  Status of chronic or acute concerns   Palpitations-determined to be PVCs-currently on diltiazem after seeing [facility]. Better overall so wants to hold off on med increase- some days no symptoms. From last note "1. Structurally normal heart 2. Negative stress echo with no inducible ischemia 3. Benign appearing PACs and occasional PVCs 4. Hypertension  Recommendations:  1. His prognosis should be good based on this testing, he was instructed to let us know if his symptoms worsen or become more severe and perhaps he would then be willing to consider a beta-blocker 2. He will follow-up in 6 months to reassess his symptoms"  Essential hypertension-controlled on diltiazem 120 mg extended release, lisinopril 20 mg. He monitors at home and is controlled  Hyperlipidemia, unspecified hyperlipidemia type-very mild poor control on rosuvastatin 10 mg. Myalgias in past- will remain on current dose and try to work on lifestyle.  Lab Results  Component Value Date   CHOL 160 10/27/2019   HDL 58.70 10/27/2019   LDLCALC 78 10/27/2019   TRIG 118.0  10/27/2019   CHOLHDL 3 10/27/2019   Hyperglycemia- diet has not been the same with daughter who is a vegetarian at and she is doing more pastas, pizza  and less lean meats that would be more filling. We opted to recheck this next year- and if remains high or trends up could get a1c  Chronic pansinusitis- recently started on augmentin- feeling better after 3 days. covid 19 test - not detected.  Has seen ENT in the past- even when "well" not fully well- sees them intermittently.  Sinuses and eczema have been better since cutting out alcohol  - sinus rinse actually made it worse that ENT had tried  ILD (interstitial lung disease) (H[facility])-patient never got a clear answer as he had slides which were lost unfortunately -have been seen here at [facility] and later at [facility] and most recently [facility]. Fortunately no lingering issues.  Thought potentially infectious that later cleared-last physical he mentioned having 1 final CT scan with [facility]- they discussed this and since he is asymptomatic he opted to not pursue this.  Does not have planned pulmonology follow-up-last CT 10/22/2017- "IMPRESSION:  1. Stable postoperative atelectasis or scarring in the medial right lower lobe.  2. Persistent small right effusion.  3. stable remaining lung nodules."  Nodules all 10 mm or less and no follow-up was recommended - still with some pain in area where had biopsy  Takes vitamin D- states never had low. Takes 1000 units per day  Eczema- intermittent flare ups- comes and goes and uses steroid creams as needed  ED - trial low dose sildenafil. Has hx HA on cialis so hopeful low dose may help as really only has minor issues  Recommended follow up: 6 month follow up   Lab/Order associations: already had labs this AM   ICD-10-CM   1. Preventative health care  Z00.00   2. Essential hypertension  I10   3. Hyperlipidemia, unspecified hyperlipidemia type  E78.5   4. Chronic pansinusitis  J32.4   5. ILD  (interstitial lung disease) ([place])  J84.9    Meds ordered this encounter  Medications  . sildenafil (REVATIO) 20 MG tablet    Sig: Take 1-5 tablets as needed once every 48 hours for erectile dysfunction    Dispense:  25 tablet    Refill:  3   Return precautions advised.  Garret Reddish, MD

## 2019-10-27 NOTE — Patient Instructions (Addendum)
Great to see you today!   Lets work on getting sugar and cholesterol back down with healthy eating and regular exercise  1 year physical as well if you wanted to go ahead and schedule   If you dont see me at 6 months make sure to check BP at least every few weeks with target <140/90

## 2019-10-27 NOTE — Assessment & Plan Note (Signed)
ILD (interstitial lung disease) (HCC)-patient never got a clear answer as he had slides which were lost unfortunately -have been seen here at [facility] and later at [facility] and most recently [facility]. Fortunately no lingering issues.  Thought potentially infectious that later cleared-last physical he mentioned having 1 final CT scan with [facility]- they discussed this and since he is asymptomatic he opted to not pursue this.  Does not have planned pulmonology follow-up-last CT 10/22/2017- "IMPRESSION:  1. Stable postoperative atelectasis or scarring in the medial right lower lobe.  2. Persistent small right effusion.  3. stable remaining lung nodules."  Nodules all 10 mm or less and no follow-up was recommended

## 2019-10-28 LAB — SAR COV2 SEROLOGY (COVID19)AB(IGG),IA: SARS CoV2 AB IGG: NEGATIVE

## 2020-01-06 ENCOUNTER — Encounter: Payer: Self-pay | Admitting: Family Medicine

## 2020-01-06 ENCOUNTER — Telehealth: Payer: Self-pay | Admitting: Family Medicine

## 2020-01-06 ENCOUNTER — Ambulatory Visit (INDEPENDENT_AMBULATORY_CARE_PROVIDER_SITE_OTHER): Payer: 59 | Admitting: Family Medicine

## 2020-01-06 ENCOUNTER — Telehealth: Payer: Self-pay

## 2020-01-06 VITALS — Ht 74.0 in | Wt 244.0 lb

## 2020-01-06 DIAGNOSIS — J324 Chronic pansinusitis: Secondary | ICD-10-CM

## 2020-01-06 MED ORDER — AMOXICILLIN-POT CLAVULANATE ER 1000-62.5 MG PO TB12: 2.0000 | ORAL_TABLET | Freq: Two times a day (BID) | ORAL | 0 refills | Status: DC

## 2020-01-06 NOTE — Telephone Encounter (Signed)
Patient is calling back because  the prescription that was provided today is not covered under pt insurance.patient would like to know is there something else that he could take that would be covered. Patient would like a call back as soon as possible.

## 2020-01-06 NOTE — Telephone Encounter (Signed)
Also patient would like nurse to give him a call once this is done.

## 2020-01-06 NOTE — Progress Notes (Signed)
Phone [phone] Virtual visit via Video note   Subjective:  Chief complaint: Chief Complaint  Patient presents with  . Sinusitis    This visit type was conducted due to national recommendations for restrictions regarding the COVID-19 Pandemic (e.g. social distancing).  This format is felt to be most appropriate for this patient at this time balancing risks to patient and risks to population by having him in for in person visit.  No physical exam was performed (except for noted visual exam or audio findings with Telehealth visits).    Our team/I connected with Ricardo Garcia at 11:40 AM EST by a video enabled telemedicine application (doxy.me or caregility through epic) and verified that I am speaking with the correct person using two identifiers.  Location patient: [facility] Location provider: [facility], office Persons participating in the virtual visit:  patient  Our team/I discussed the limitations of evaluation and management by telemedicine and the availability of in person appointments. In light of current covid-19 pandemic, patient also understands that we are trying to protect them by minimizing in office contact if at all possible.  The patient expressed consent for telemedicine visit and agreed to proceed. Patient understands insurance will be billed.   Past Medical History-  Patient Active Problem List   Diagnosis Date Noted  . ILD (interstitial lung disease) ([place]) 03/07/2017    Priority: High  . Hemoptysis 12/11/2016    Priority: High  .  PULMONARY INFILTRATES...BILATERAL LOWER LOBES 12/11/2016    Priority: High  . Lung nodules 12/11/2016    Priority: High  . PVC's (premature ventricular contractions) 07/06/2019    Priority: Medium  . Eczema 04/15/2017    Priority: Medium  . Sinusitis, chronic 07/28/2013    Priority: Medium  . Hypertension 01/26/2012    Priority: Medium  . Hyperlipidemia 01/26/2012    Priority: Medium    Medications- reviewed and  updated Current Outpatient Medications  Medication Sig Dispense Refill  . Bromelains 500 MG TABS Take 1 tablet by mouth 2 (two) times daily.    Marland Kitchen diltiazem (CARDIZEM CD) 120 MG 24 hr capsule Take 1 capsule (120 mg total) by mouth daily. 90 capsule 3  . diltiazem (TIAZAC) 120 MG 24 hr capsule Take by mouth.    . halobetasol (ULTRAVATE) 0.05 % cream Apply topically 2 (two) times daily. For eczema. 7 days maximum 50 g 2  . lisinopril (ZESTRIL) 20 MG tablet Take 1 tablet (20 mg total) by mouth daily. 90 tablet 3  . rosuvastatin (CRESTOR) 10 MG tablet Take 1 tablet (10 mg total) by mouth daily. 90 tablet 3  . sildenafil (REVATIO) 20 MG tablet Take 1-5 tablets as needed once every 48 hours for erectile dysfunction 25 tablet 3  . amoxicillin-clavulanate (AUGMENTIN XR) 1000-62.5 MG 12 hr tablet Take 2 tablets by mouth 2 (two) times daily. 28 tablet 0   No current facility-administered medications for this visit.     Objective:  Ht 6\' 2"  (1.88 m)   Wt 244 lb (110.7 kg)   BMI 31.33 kg/m  self reported vitals Gen: NAD, resting comfortably Lungs: nonlabored, normal respiratory rate  Skin: appears dry, no obvious rash     Assessment and Plan   #Chronic sinusitis with exacerbation S:Patient is currently having progressive congestion in last few months.  He generally has chronic congestion but this has been worse.States that this has been ongoing for over 10 years. Normally gets round of augmentin. Last treated in October for this- got better with this then  worsened. His symptoms did not go away completely after that round of antibiotics. Patient has not used any other medications other than saline nasal spray. Patient has had pressure and headache in the mornings but improves as day progresses.   For last few weeks, darker color and seen some blood. Cough and blows nose for first 30 minutes of the day. Has some sensitivity in upper sinuses.    ROS-does have cough, congestion, runny nose. No fever,  chills, shortness of breath, fatigue, body aches, sore throat, headache, nausea, vomiting, diarrhea, or new loss of taste or smell. No known contacts with covid 19 or someone being tested for covid 19.   A/P: Patient with chronic sinusitis with recent worsening concerning for bacterial superinfection.  Had quick return of symptoms after last round of Augmentin-we opted to use high-dose 2000 mg - 125 mg Augmentin for 7 days.  We considered Levaquin but he has an allergy to this with myalgias.  We discussed following up with ear nose and throat if he fails to improve on this course-may need imaging or surgery.  Discussed prednisone but he said blurry vision on this in the past  Lab/Order associations:   ICD-10-CM   1. Chronic pansinusitis  J32.4     Meds ordered this encounter  Medications  . amoxicillin-clavulanate (AUGMENTIN XR) 1000-62.5 MG 12 hr tablet    Sig: Take 2 tablets by mouth 2 (two) times daily.    Dispense:  28 tablet    Refill:  0  Chronic condition with acute worsening requiring medication management  Return precautions advised.  Garret Reddish, MD

## 2020-01-06 NOTE — Telephone Encounter (Signed)
Patient called in saying that the new prescription Amoxicillin 62.5 MG is not covered under his insurance and wanted to know if Dr.Hunter could please send something else in for him, possibly the old dosage. Patient sates he is leaving for town in a couple of hours and is really needing this Rx, and if it cant be done today to send it to [facility] but if somehow it can be fixed within the next hour to send it to the [facility].

## 2020-01-06 NOTE — Patient Instructions (Signed)
There are no preventive care reminders to display for this patient.  Depression screen [facility] 2/9 10/27/2019 07/01/2019 10/21/2018  Decreased Interest 0 0 0  Down, Depressed, Hopeless 0 0 0  PHQ - 2 Score 0 0 0    Recommended follow up: No follow-ups on file.

## 2020-01-07 MED ORDER — AMOXICILLIN-POT CLAVULANATE 875-125 MG PO TABS: 1.0000 | ORAL_TABLET | Freq: Two times a day (BID) | ORAL | 0 refills | Status: DC

## 2020-01-07 NOTE — Telephone Encounter (Signed)
Added from another message:   Patient called in saying that the new prescription Amoxicillin 62.5 MG is not covered under his insurance and wanted to know if Dr.Hunter could please send something else in for him, possibly the old dosage. Patient sates he is leaving for town in a couple of hours and is really needing this Rx, and if it cant be done today to [facility] but if somehow it can be fixed within the next hour to send it to the [facility].

## 2020-01-07 NOTE — Addendum Note (Signed)
Addended by: Marin Olp on: 01/07/2020 01:37 PM   Modules accepted: Orders

## 2020-01-07 NOTE — Telephone Encounter (Signed)
Patient has several messages about this have called him and let him know I will review with Dr Yong Channel as soon as I can and get back to him

## 2020-01-07 NOTE — Telephone Encounter (Signed)
Called patient let know that we were not in office yesterday at that time. Apologized for any incontinence. I have updated pharmacy to one near his work for today

## 2020-01-07 NOTE — Telephone Encounter (Signed)
Called let patient know will pick up now

## 2020-01-07 NOTE — Telephone Encounter (Signed)
I sent in the old/prior/traditional dosage to pharmacy listed-please inform him

## 2020-01-19 ENCOUNTER — Encounter: Payer: Self-pay | Admitting: Family Medicine

## 2020-02-12 ENCOUNTER — Encounter: Payer: Self-pay | Admitting: Family Medicine

## 2020-02-12 DIAGNOSIS — Z1211 Encounter for screening for malignant neoplasm of colon: Secondary | ICD-10-CM

## 2020-02-14 ENCOUNTER — Ambulatory Visit: Payer: 59 | Attending: Internal Medicine

## 2020-02-14 ENCOUNTER — Encounter: Payer: Self-pay | Admitting: Family Medicine

## 2020-02-14 DIAGNOSIS — Z23 Encounter for immunization: Secondary | ICD-10-CM | POA: Insufficient documentation

## 2020-02-14 NOTE — Progress Notes (Signed)
   Covid-19 Vaccination Clinic  Name:  Ricardo Garcia    MRN: [id number] DOB: 1956-10-12  02/14/2020  Ricardo Garcia was observed post Covid-19 immunization for 15 minutes without incidence. He was provided with Vaccine Information Sheet and instruction to access the V-Safe system.   Ricardo Garcia was instructed to call 911 with any severe reactions post vaccine: Marland Kitchen Difficulty breathing  . Swelling of your face and throat  . A fast heartbeat  . A bad rash all over your body  . Dizziness and weakness    Immunizations Administered    Name Date Dose VIS Date Route   Pfizer COVID-19 Vaccine 02/14/2020  9:24 AM 0.3 mL 12/04/2019 Intramuscular   Manufacturer: [place]   Lot: [id number]   [place]: [id number]

## 2020-02-16 ENCOUNTER — Other Ambulatory Visit: Payer: Self-pay | Admitting: Family Medicine

## 2020-03-02 ENCOUNTER — Other Ambulatory Visit: Payer: Self-pay

## 2020-03-02 DIAGNOSIS — Z1211 Encounter for screening for malignant neoplasm of colon: Secondary | ICD-10-CM

## 2020-03-09 ENCOUNTER — Ambulatory Visit: Payer: 59 | Attending: Internal Medicine

## 2020-03-09 DIAGNOSIS — Z23 Encounter for immunization: Secondary | ICD-10-CM

## 2020-03-09 NOTE — Progress Notes (Signed)
   Covid-19 Vaccination Clinic  Name:  Ricardo Garcia    MRN: [id number] DOB: 01/07/1956  03/09/2020  [name] was observed post Covid-19 immunization for 15 minutes without incident. He was provided with Vaccine Information Sheet and instruction to access the V-Safe system.   [name] was instructed to call 911 with any severe reactions post vaccine: Marland Kitchen Difficulty breathing  . Swelling of face and throat  . A fast heartbeat  . A bad rash all over body  . Dizziness and weakness   Immunizations Administered    Name Date Dose VIS Date Route   Pfizer COVID-19 Vaccine 03/09/2020 10:06 AM 0.3 mL 12/04/2019 Intramuscular   Manufacturer: [place]   Lot: [id number]   [place]: [id number]

## 2020-03-10 ENCOUNTER — Encounter: Payer: Self-pay | Admitting: Family Medicine

## 2020-05-23 LAB — COLOGUARD: COLOGUARD: NEGATIVE

## 2020-06-13 ENCOUNTER — Encounter: Payer: Self-pay | Admitting: Family Medicine

## 2020-06-15 ENCOUNTER — Telehealth: Payer: Self-pay | Admitting: Family Medicine

## 2020-06-15 ENCOUNTER — Encounter: Payer: Self-pay | Admitting: Family Medicine

## 2020-06-15 LAB — COLOGUARD: Cologuard: NEGATIVE

## 2020-06-15 NOTE — Telephone Encounter (Signed)
Ricardo Garcia is calling in asking if we received the cologuard result, verified fax and they have faxed it again this morning.

## 2020-06-15 NOTE — Telephone Encounter (Signed)
Results received patient in formed

## 2020-06-19 ENCOUNTER — Encounter: Payer: Self-pay | Admitting: Family Medicine

## 2020-06-20 ENCOUNTER — Other Ambulatory Visit: Payer: Self-pay

## 2020-06-20 MED ORDER — HALOBETASOL PROPIONATE 0.05 % EX CREA: TOPICAL_CREAM | Freq: Two times a day (BID) | CUTANEOUS | 2 refills | Status: DC

## 2020-06-21 ENCOUNTER — Other Ambulatory Visit: Payer: Self-pay

## 2020-06-21 MED ORDER — CLOBETASOL PROPIONATE 0.05 % EX SOLN: 1.0000 "application " | Freq: Two times a day (BID) | CUTANEOUS | 2 refills | Status: AC

## 2020-06-26 ENCOUNTER — Other Ambulatory Visit: Payer: Self-pay | Admitting: Family Medicine

## 2020-09-20 ENCOUNTER — Encounter: Payer: Self-pay | Admitting: Family Medicine

## 2020-10-05 ENCOUNTER — Encounter: Payer: Self-pay | Admitting: Family Medicine

## 2020-10-14 ENCOUNTER — Other Ambulatory Visit (HOSPITAL_BASED_OUTPATIENT_CLINIC_OR_DEPARTMENT_OTHER): Payer: Self-pay | Admitting: Internal Medicine

## 2020-10-14 ENCOUNTER — Ambulatory Visit: Payer: 59 | Attending: Internal Medicine

## 2020-10-14 DIAGNOSIS — Z23 Encounter for immunization: Secondary | ICD-10-CM

## 2020-10-14 NOTE — Progress Notes (Signed)
   Covid-19 Vaccination Clinic  Name:  MALIK PAAR    MRN: [id number] DOB: 11-25-1956  10/14/2020  Mr. Tersigni was observed post Covid-19 immunization for 15 minutes without incident. He was provided with Vaccine Information Sheet and instruction to access the V-Safe system.  Vaccinated by [facility]   Mr. Jeanpaul was instructed to call 911 with any severe reactions post vaccine: Marland Kitchen Difficulty breathing  . Swelling of face and throat  . A fast heartbeat  . A bad rash all over body  . Dizziness and weakness

## 2020-10-21 MED FILL — PFIZER-BIONTECH COVID-19 VA: 30 | 1 days supply | Qty: 0 | Fill #0

## 2020-11-07 ENCOUNTER — Ambulatory Visit: Payer: 59 | Admitting: Family Medicine

## 2020-11-07 ENCOUNTER — Other Ambulatory Visit: Payer: Self-pay

## 2020-11-07 VITALS — BP 121/73 | HR 76 | Temp 98.2°F | Ht 74.0 in | Wt 238.8 lb

## 2020-11-07 DIAGNOSIS — L0291 Cutaneous abscess, unspecified: Secondary | ICD-10-CM | POA: Diagnosis not present

## 2020-11-07 NOTE — Patient Instructions (Signed)
Incision and Drainage, Care After This sheet gives you information about how to care for yourself after your procedure. Your health care provider may also give you more specific instructions. If you have problems or questions, contact your health care provider. What can I expect after the procedure? After the procedure, it is common to have:  Pain or discomfort around the incision site.  Blood, fluid, or pus (drainage) from the incision.  Redness and firm skin around the incision site. Follow these instructions at home: Medicines  Take over-the-counter and prescription medicines only as told by your health care provider.  If you were prescribed an antibiotic medicine, use or take it as told by your health care provider. Do not stop using the antibiotic even if you start to feel better. Wound care Follow instructions from your health care provider about how to take care of your wound. Make sure you:  Wash your hands with soap and water before and after you change your bandage (dressing). If soap and water are not available, use hand sanitizer.  Change your dressing and packing as told by your health care provider. ? If your dressing is dry or stuck when you try to remove it, moisten or wet the dressing with saline or water so that it can be removed without harming your skin or tissues. ? If your wound is packed, leave it in place until your health care provider tells you to remove it. To remove the packing, moisten or wet the packing with saline or water so that it can be removed without harming your skin or tissues.  Leave stitches (sutures), skin glue, or adhesive strips in place. These skin closures may need to stay in place for 2 weeks or longer. If adhesive strip edges start to loosen and curl up, you may trim the loose edges. Do not remove adhesive strips completely unless your health care provider tells you to do that. Check your wound every day for signs of infection. Check  for:  More redness, swelling, or pain.  More fluid or blood.  Warmth.  Pus or a bad smell. If you were sent home with a drain tube in place, follow instructions from your health care provider about:  How to empty it.  How to care for it at home.  General instructions  Rest the affected area.  Do not take baths, swim, or use a hot tub until your health care provider approves. Ask your health care provider if you may take showers. You may only be allowed to take sponge baths.  Return to your normal activities as told by your health care provider. Ask your health care provider what activities are safe for you. Your health care provider may put you on activity or lifting restrictions.  The incision will continue to drain. It is normal to have some clear or slightly bloody drainage. The amount of drainage should lessen each day.  Do not apply any creams, ointments, or liquids unless you have been told to by your health care provider.  Keep all follow-up visits as told by your health care provider. This is important. Contact a health care provider if:  Your cyst or abscess returns.  You have a fever or chills.  You have more redness, swelling, or pain around your incision.  You have more fluid or blood coming from your incision.  Your incision feels warm to the touch.  You have pus or a bad smell coming from your incision.  You have red streaks   above or below the incision site. Get help right away if:  You have severe pain or bleeding.  You cannot eat or drink without vomiting.  You have decreased urine output.  You become short of breath.  You have chest pain.  You cough up blood.  The affected area becomes numb or starts to tingle. These symptoms may represent a serious problem that is an emergency. Do not wait to see if the symptoms will go away. Get medical help right away. Call your local emergency services (911 in the [place] Do not drive yourself to the  hospital. Summary  After this procedure, it is common to have fluid, blood, or pus coming from the surgery site.  Follow all home care instructions. You will be told how to take care of your incision, how to check for infection, and how to take medicines.  If you were prescribed an antibiotic medicine, take it as told by your health care provider. Do not stop taking the antibiotic even if you start to feel better.  Contact a health care provider if you have increased redness, swelling, or pain around your incision. Get help right away if you have chest pain, you vomit, you cough up blood, or you have shortness of breath.  Keep all follow-up visits as told by your health care provider. This is important. This information is not intended to replace advice given to you by your health care provider. Make sure you discuss any questions you have with your health care provider. Document Revised: 11/10/2018 Document Reviewed: 11/10/2018 Elsevier Patient Education  2020 Elsevier Inc.   

## 2020-11-07 NOTE — Progress Notes (Signed)
   Ricardo Garcia is a 64 y.o. male who presents today for an office visit.  Assessment/Plan:  New/Acute Problems: Abscess No red flags.  I&D performed today.  Tolerated well.  See below procedure note.  Chronic Problems Addressed Today: No problem-specific Assessment & Plan notes found for this encounter.     Subjective:  HPI:  Patient here with painful lesion to left upper back.  Started a few weeks ago.  Had something similar 20 years ago that resolved on its own.  Has been noticed some draining blood and pus.  No fevers or chills.  Had some fatigue with it.       Objective:  Physical Exam: BP 121/73   Pulse 76   Temp 98.2 F (36.8 C) (Temporal)   Ht 6\' 2"  (1.88 m)   Wt 238 lb 12.8 oz (108.3 kg)   SpO2 96%   BMI 30.66 kg/m   Gen: No acute distress, resting comfortably Skin: Abscess approximately 2.5 cm in diameter on right upper back. Neuro: Grossly normal, moves all extremities Psych: Normal affect and thought content  Incision and Drainage Procedure Note  Pre-operative Diagnosis: Abscess  Post-operative Diagnosis: same  Indications: Therapeutic  Anesthesia: 1% lidocaine with epinephrine  Procedure Details  The procedure, risks and complications have been discussed in detail (including, but not limited to airway compromise, infection, bleeding) with the patient, and the patient has signed consent to the procedure.  The skin was sterilely prepped and draped over the affected area in the usual fashion. After adequate local anesthesia, I&D with a #11 blade was performed on the right upper back. Purulent drainage: present The patient was observed until stable.  Findings: Abscess  EBL: 3 cc's  Drains: N/A  Condition: Tolerated procedure well   Complications: none.        Algis Greenhouse. Jerline Pain, MD 11/07/2020 4:14 PM

## 2020-11-30 ENCOUNTER — Encounter: Payer: Self-pay | Admitting: Family Medicine

## 2020-11-30 ENCOUNTER — Telehealth (INDEPENDENT_AMBULATORY_CARE_PROVIDER_SITE_OTHER): Payer: 59 | Admitting: Physician Assistant

## 2020-11-30 ENCOUNTER — Encounter: Payer: Self-pay | Admitting: Physician Assistant

## 2020-11-30 VITALS — Ht 74.0 in | Wt 230.0 lb

## 2020-11-30 DIAGNOSIS — J324 Chronic pansinusitis: Secondary | ICD-10-CM | POA: Diagnosis not present

## 2020-11-30 MED ORDER — AMOXICILLIN-POT CLAVULANATE 875-125 MG PO TABS
1.0000 | ORAL_TABLET | Freq: Two times a day (BID) | ORAL | 0 refills | Status: DC
Start: 2020-11-30 — End: 2021-01-27

## 2020-11-30 NOTE — Progress Notes (Signed)
Virtual Visit via Video   I connected with Ricardo Garcia on 11/30/20 at 12:30 PM EST by a video enabled telemedicine application and verified that I am speaking with the correct person using two identifiers. Location patient: Home Location provider: [facility], Office Persons participating in the virtual visit: Ricardo Garcia, Ricardo Coke PA-C, Ricardo Pickler, LPN   I discussed the limitations of evaluation and management by telemedicine and the availability of in person appointments. The patient expressed understanding and agreed to proceed.  I acted as a Education administrator for [employer], [employer], LPN   Subjective:   HPI:   Sinus problem Pt is c/o nasal congestion and drainage thick green. Also having mild headaches and nose bleeds. No fever, n/v/d or chills. Pt has been using steam and saline rinse.   Was tested for COVID last week and was negative.  Hx of sinusitis and feels like these are his typical symptoms.  Denies significant cough/wheezing, SOB.  ROS: See pertinent positives and negatives per HPI.  Patient Active Problem List   Diagnosis Date Noted  . PVC's (premature ventricular contractions) 07/06/2019  . Eczema 04/15/2017  . ILD (interstitial lung disease) ([place]) 03/07/2017  . Hemoptysis 12/11/2016  .  PULMONARY INFILTRATES...BILATERAL LOWER LOBES 12/11/2016  . Lung nodules 12/11/2016  . Sinusitis, chronic 07/28/2013  . Hypertension 01/26/2012  . Hyperlipidemia 01/26/2012    Social History   Tobacco Use  . Smoking status: Never Smoker  . Smokeless tobacco: Never Used  Substance Use Topics  . Alcohol use: Never    Alcohol/week: 8.0 standard drinks    Types: 1 Glasses of wine, 7 Standard drinks or equivalent per week    Comment: had been daily- quit 2019    Current Outpatient Medications:  .  Bromelains 500 MG TABS, Take 1 tablet by mouth daily. , Disp: , Rfl:  .  diltiazem (CARDIZEM CD) 120 MG 24 hr capsule, TAKE 1 CAPSULE BY MOUTH   DAILY, Disp: 90 capsule, Rfl: 3 .  halobetasol (ULTRAVATE) 0.05 % cream, Apply topically 2 (two) times daily. For eczema. 7 days maximum, Disp: 50 g, Rfl: 2 .  lisinopril (ZESTRIL) 20 MG tablet, TAKE 1 TABLET BY MOUTH  DAILY, Disp: 90 tablet, Rfl: 3 .  rosuvastatin (CRESTOR) 10 MG tablet, TAKE 1 TABLET BY MOUTH  DAILY, Disp: 90 tablet, Rfl: 3 .  sildenafil (REVATIO) 20 MG tablet, Take 1-5 tablets as needed once every 48 hours for erectile dysfunction, Disp: 25 tablet, Rfl: 3 .  amoxicillin-clavulanate (AUGMENTIN) 875-125 MG tablet, Take 1 tablet by mouth 2 (two) times daily., Disp: 20 tablet, Rfl: 0 .  clobetasol (TEMOVATE) 0.05 % external solution, Apply 1 application topically 2 (two) times daily. 7 days maximum. For scalp (Patient not taking: Reported on 11/07/2020), Disp: 50 mL, Rfl: 2  Allergies  Allergen Reactions  . Levaquin [Levofloxacin] Other (See Comments)    MYALGIAS.Marland KitchenMarland KitchenNOTED AFTER TREATMENT 10/2016    Objective:   VITALS: Per patient if applicable, see vitals. GENERAL: Alert, appears well and in no acute distress. HEENT: Atraumatic, conjunctiva clear, no obvious abnormalities on inspection of external nose and ears. NECK: Normal movements of the head and neck. CARDIOPULMONARY: No increased WOB. Speaking in clear sentences. I:E ratio WNL.  MS: Moves all visible extremities without noticeable abnormality. PSYCH: Pleasant and cooperative, well-groomed. Speech normal rate and rhythm. Affect is appropriate. Insight and judgement are appropriate. Attention is focused, linear, and appropriate.  NEURO: CN grossly intact. Oriented as arrived to appointment on  time with no prompting. Moves both UE equally.  SKIN: No obvious lesions, wounds, erythema, or cyanosis noted on face or hands.  Assessment and Plan:   [name] was seen today for sinus problem.  Diagnoses and all orders for this visit:  Chronic pansinusitis  Other orders -     amoxicillin-clavulanate (AUGMENTIN) 875-125 MG  tablet; Take 1 tablet by mouth 2 (two) times daily.   No red flags on discussion.  Will initiate oral augmentin per orders for presumed sinusitis. Discussed taking medications as prescribed. Reviewed return precautions including worsening fever, SOB, worsening cough or other concerns. Push fluids and rest. I recommend that patient follow-up if symptoms worsen or persist despite treatment x 7-10 days, sooner if needed.   I discussed the assessment and treatment plan with the patient. The patient was provided an opportunity to ask questions and all were answered. The patient agreed with the plan and demonstrated an understanding of the instructions.   The patient was advised to call back or seek an in-person evaluation if the symptoms worsen or if the condition fails to improve as anticipated.   CMA or LPN served as scribe during this visit. History, Physical, and Plan performed by medical provider. The above documentation has been reviewed and is accurate and complete.   [place], [place] 11/30/2020

## 2021-01-21 ENCOUNTER — Encounter: Payer: Self-pay | Admitting: Family Medicine

## 2021-01-26 NOTE — Progress Notes (Unsigned)
Phone: [phone]   Subjective:  Patient presents today for their annual physical. Chief complaint-noted.   See problem oriented charting- ROS- full  review of systems was completed and negative  except for: chronic congestion, post nasal drip, runny nose, sinus pressure  The following were reviewed and entered/updated in epic: Past Medical History:  Diagnosis Date  . Diverticulitis   . Hyperlipidemia   . Hypertension   . PONV (postoperative nausea and vomiting)    when he was 66 years old   Patient Active Problem List   Diagnosis Date Noted  . ILD (interstitial lung disease) ([place]) 03/07/2017    Priority: High  . Hemoptysis 12/11/2016    Priority: High  .  PULMONARY INFILTRATES...BILATERAL LOWER LOBES 12/11/2016    Priority: High  . Lung nodules 12/11/2016    Priority: High  . PVC's (premature ventricular contractions) 07/06/2019    Priority: Medium  . Eczema 04/15/2017    Priority: Medium  . Sinusitis, chronic 07/28/2013    Priority: Medium  . Hypertension 01/26/2012    Priority: Medium  . Hyperlipidemia 01/26/2012    Priority: Medium   Past Surgical History:  Procedure Laterality Date  .  bone spur removal     2016- foot  . ARTHROTOMY Right 11/15/2015   Procedure: RIGHT ANKLE ARTHROTOMY ;  Surgeon: Melrose Nakayama, MD;  Location: [facility];  Service: Orthopedics;  Laterality: Right;  . balloon septoplasty    . COLONOSCOPY  2011  . ELECTROMAGNETIC NAVIGATION BROCHOSCOPY  12/20/2016  . FOREIGN BODY REMOVAL Right 11/15/2015   Procedure: RIGHT ANKLE LOOSE BODY REMOVAL;  Surgeon: Melrose Nakayama, MD;  Location: [facility];  Service: Orthopedics;  Laterality: Right;  . HERNIA REPAIR     65 years old  . LUNG BIOPSY Right 03/07/2017   Procedure: RIGHT LUNG BIOPSY;  Surgeon: Melrose Nakayama, MD;  Location: [facility];  Service: Thoracic;  Laterality: Right;  . LYMPH NODE DISSECTION Right 03/07/2017   Procedure: LYMPH NODE DISSECTION, right lung;  Surgeon: Melrose Nakayama,  MD;  Location: [facility];  Service: Thoracic;  Laterality: Right;  . SEGMENTECOMY Right 03/07/2017   Procedure: RIGHT LOWER LOBE SUPERIOR SEGMENTECTOMY;  Surgeon: Melrose Nakayama, MD;  Location: [name];  Service: Thoracic;  Laterality: Right;  Marland Kitchen VIDEO ASSISTED THORACOSCOPY (VATS)/WEDGE RESECTION Right 03/07/2017   Procedure: RIGHT LUNG VIDEO ASSISTED THORACOSCOPY (VATS)/WEDGE RESECTION;  Surgeon: Melrose Nakayama, MD;  Location: [facility];  Service: Thoracic;  Laterality: Right;    Family History  Problem Relation Age of Onset  . Hypertension Mother        uncontrolled  . Stroke Mother        50  . Other Mother        died- fell after stroke and broke ribs- "lungs filled with fluid"  . Other Father        unknown cause of death age 14  . Rheum arthritis Father   . Other Brother        some step siblingsonly    Medications- reviewed and updated Current Outpatient Medications  Medication Sig Dispense Refill  . amoxicillin-clavulanate (AUGMENTIN) 875-125 MG tablet Take 1 tablet by mouth 2 (two) times daily for 14 days. 28 tablet 0  . Bromelains 500 MG TABS Take 1 tablet by mouth daily.     . clobetasol (TEMOVATE) 0.05 % external solution Apply 1 application topically 2 (two) times daily. 7 days maximum. For scalp (Patient taking differently: Apply 1 application topically daily as needed. As  needed.) 50 mL 2  . diltiazem (CARDIZEM CD) 120 MG 24 hr capsule TAKE 1 CAPSULE BY MOUTH  DAILY 90 capsule 3  . diltiazem (TIAZAC) 120 MG 24 hr capsule Take by mouth.    . halobetasol (ULTRAVATE) 0.05 % cream Apply topically 2 (two) times daily. For eczema. 7 days maximum (Patient taking differently: Apply topically as needed. PRN) 50 g 2  . lisinopril (ZESTRIL) 20 MG tablet TAKE 1 TABLET BY MOUTH  DAILY 90 tablet 3  . rosuvastatin (CRESTOR) 10 MG tablet TAKE 1 TABLET BY MOUTH  DAILY 90 tablet 3  . sildenafil (REVATIO) 20 MG tablet Take 1-5 tablets as needed once every 48 hours for erectile dysfunction  25 tablet 3  . VITAMIN D PO Take by mouth.     No current facility-administered medications for this visit.    Allergies-reviewed and updated Allergies  Allergen Reactions  . Levaquin [Levofloxacin] Other (See Comments)    MYALGIAS.Marland KitchenMarland KitchenNOTED AFTER TREATMENT 10/2016    Social History   Social History Narrative   Divorced. Lives alone. 2 children- son 88 at [place], daughter 78- [employer].       [place].    [place]- works in [employer]: gym 4-5 days a week, guitar, time with friends and family   Objective  Objective:  BP 128/78   Pulse 77   Temp 98 F (36.7 C) (Temporal)   Ht 6\' 2"  (1.88 m)   Wt 240 lb 12.8 oz (109.2 kg)   SpO2 93%   BMI 30.92 kg/m  Gen: NAD, resting comfortably HEENT: Mucous membranes are moist. Oropharynx normal. Turbinate erythema bilaterally with clumps of yellow discharge in left nare. No sinus pressure Neck: no thyromegaly CV: RRR no murmurs rubs or gallops Lungs: CTAB no crackles, wheeze, rhonchi Abdomen: soft/nontender/nondistended/normal bowel sounds. No rebound or guarding.  Ext: no edema Skin: warm, dry Neuro: grossly normal, moves all extremities, PERRLA    Assessment and Plan  65 y.o. male presenting for annual physical.  Health Maintenance counseling: 1. Anticipatory guidance: Patient counseled regarding regular dental exams -q6 months, eye exams -yearly,  avoiding smoking and second hand smoke , limiting alcohol to 2 beverages per day .   2. Risk factor reduction:  Advised patient of need for regular exercise and diet rich and fruits and vegetables to reduce risk of heart attack and stroke. Exercise- gym at least 3-4 days a week. Diet-got down to 222 on home scales and then the holidays hit (did well with low carb prior to this).  Weight down 4 lbs from last year- from prior cpe to CPE was up 18 lbs Wt Readings from Last 3 Encounters:  01/27/21 240 lb 12.8 oz (109.2 kg)  11/30/20 230 lb  (104.3 kg)  11/07/20 238 lb 12.8 oz (108.3 kg)  3. Immunizations/screenings/ancillary studies- discussed prevnar 13 - opts in with ILD I think this is reasonable  Though has done well. Also due for Tdap - will do at pharmacy as on medicare noww Immunization History  Administered Date(s) Administered  . Influenza Inj Mdck Quad Pf 10/29/2017  . Influenza Split 10/16/2012, 09/23/2013, 12/24/2016  . Influenza, Seasonal, Injecte, Preservative Fre 10/01/2015, 12/24/2016  . Influenza,inj,Quad PF,6+ Mos 10/01/2015, 10/21/2018, 09/14/2019, 09/29/2020  . Influenza-Unspecified 10/18/2014  . PFIZER(Purple Top)SARS-COV-2 Vaccination 02/14/2020, 03/09/2020, 10/14/2020  . Pneumococcal Polysaccharide-23 02/11/2017  . Tdap 12/24/2010  . Zoster 12/24/2010  . Zoster Recombinat (Shingrix) 11/01/2017, 01/31/2018  4. Prostate cancer  screening-  low risk prior PSA trend- can defer  Rectal as long as trend low risk. Nocturia 2-3x a night Lab Results  Component Value Date   PSA 0.80 10/27/2019   PSA 0.86 10/21/2018   PSA 0.87 08/20/2017   5. Colon cancer screening -  January 2011 with Dr. Benson Norway. Then cologuar 05/19/20 with 3 year repeat 6. Skin cancer screening- seen by [facility] in past- has also been to [facility]- seen last a year ago. advised regular sunscreen use. Denies worrisome, changing, or new skin lesions.  7. Never smoker 8. STD screening - declines  Status of chronic or acute concerns   #hypertension S: medication: lisinorpil 20Mg , diltiazem 120Mg  BP Readings from Last 3 Encounters:  01/27/21 128/78  11/07/20 121/73  10/27/19 128/72  A/P: Stable. Continue current medications.   #palpitations determined to be PACs/PVCs and on diltiazem and followed by [facility]. Better lately  #hyperlipidemia S: Medication: rosuvastatin 10mg . Myalgias in past Lab Results  Component Value Date   CHOL 160 10/27/2019   HDL 58.70 10/27/2019   LDLCALC 78 10/27/2019   TRIG 118.0 10/27/2019   CHOLHDL  3 10/27/2019   A/P:  last lipid slightly high from ideal goals but with prior myalgias will not adjust dose - also may increase DM risk to increase statin  # Hyperglycemia/insulin resistance/prediabetes- cbg high- have discussed a1c if trends up   # ILD S:slides were lost unfortunately- [facility], [place] [facility]. No lingering issues though- may hae been infectious  A/P: appears controlled- continue to monitor.   Eczema- intermittent flare ups- steroid cream helps  ED- low dose sildenafil trialed last year (has not used due to pandemic). HA on cialis  Recommended follow up: Return in about 1 year (around 01/27/2022) for physical or sooner if needed.  Lab/Order associations: fasting   ICD-10-CM   1. Encounter for general adult medical examination with abnormal findings  Z00.01 CBC with Differential/Platelet    Comprehensive metabolic panel    Lipid panel    PSA    Hemoglobin A1c  2. Chronic pansinusitis  J32.4   3. Primary hypertension  I10   4. Hyperlipidemia, unspecified hyperlipidemia type  E78.5 CBC with Differential/Platelet    Comprehensive metabolic panel    Lipid panel  5. Nocturia  R35.1 PSA  6. Hyperglycemia  R73.9 Hemoglobin A1c  7. ILD (interstitial lung disease) ([place]) Chronic J84.9     Meds ordered this encounter  Medications  . amoxicillin-clavulanate (AUGMENTIN) 875-125 MG tablet    Sig: Take 1 tablet by mouth 2 (two) times daily for 14 days.    Dispense:  28 tablet    Refill:  0    Return precautions advised.  Garret Reddish, MD

## 2021-01-26 NOTE — Patient Instructions (Addendum)
Please stop by lab before you go If you have mychart- we will send your results within 3 business days of [place] receiving them.  If you do not have mychart- we will call you about results within 5 business days of [place] receiving them.  *please also note that you will see labs on mychart as soon as they post. I will later go in and write notes on them- will say "notes from Dr. Yong Channel"  Lets try a 14 day course of augmentin- keep me updated on how you are doing.   Health Maintenance Due  Topic Date Due  . PNA vac Low Risk Adult (1 of 2 - PCV13)  -prevnar 13 today 08/17/2020  . TDAP - would recommend this at your pharmacy as it will be cheaper 12/24/2020   Recommended follow up: Return in about 1 year (around 01/27/2022) for physical or sooner if needed.

## 2021-01-27 ENCOUNTER — Other Ambulatory Visit: Payer: Self-pay

## 2021-01-27 ENCOUNTER — Encounter: Payer: Self-pay | Admitting: Family Medicine

## 2021-01-27 ENCOUNTER — Ambulatory Visit (INDEPENDENT_AMBULATORY_CARE_PROVIDER_SITE_OTHER): Payer: Medicare Other | Admitting: Family Medicine

## 2021-01-27 VITALS — BP 128/78 | HR 77 | Temp 98.0°F | Ht 74.0 in | Wt 240.8 lb

## 2021-01-27 DIAGNOSIS — R351 Nocturia: Secondary | ICD-10-CM

## 2021-01-27 DIAGNOSIS — Z0001 Encounter for general adult medical examination with abnormal findings: Secondary | ICD-10-CM | POA: Diagnosis not present

## 2021-01-27 DIAGNOSIS — Z0000 Encounter for general adult medical examination without abnormal findings: Secondary | ICD-10-CM

## 2021-01-27 DIAGNOSIS — Z23 Encounter for immunization: Secondary | ICD-10-CM | POA: Diagnosis not present

## 2021-01-27 DIAGNOSIS — I10 Essential (primary) hypertension: Secondary | ICD-10-CM | POA: Diagnosis not present

## 2021-01-27 DIAGNOSIS — E785 Hyperlipidemia, unspecified: Secondary | ICD-10-CM

## 2021-01-27 DIAGNOSIS — J324 Chronic pansinusitis: Secondary | ICD-10-CM | POA: Diagnosis not present

## 2021-01-27 DIAGNOSIS — R739 Hyperglycemia, unspecified: Secondary | ICD-10-CM | POA: Diagnosis not present

## 2021-01-27 DIAGNOSIS — J849 Interstitial pulmonary disease, unspecified: Secondary | ICD-10-CM

## 2021-01-27 LAB — CBC WITH DIFFERENTIAL/PLATELET
Basophils Absolute: 0.1 10*3/uL (ref 0.0–0.1)
Basophils Relative: 0.8 % (ref 0.0–3.0)
Eosinophils Absolute: 0.2 10*3/uL (ref 0.0–0.7)
Eosinophils Relative: 3.2 % (ref 0.0–5.0)
HCT: 44.3 % (ref 39.0–52.0)
Hemoglobin: 14.9 g/dL (ref 13.0–17.0)
Lymphocytes Relative: 14 % (ref 12.0–46.0)
Lymphs Abs: 1 10*3/uL (ref 0.7–4.0)
MCHC: 33.6 g/dL (ref 30.0–36.0)
MCV: 92.3 fl (ref 78.0–100.0)
Monocytes Absolute: 0.6 10*3/uL (ref 0.1–1.0)
Monocytes Relative: 8.4 % (ref 3.0–12.0)
Neutro Abs: 5.3 10*3/uL (ref 1.4–7.7)
Neutrophils Relative %: 73.6 % (ref 43.0–77.0)
Platelets: 254 10*3/uL (ref 150.0–400.0)
RBC: 4.8 Mil/uL (ref 4.22–5.81)
RDW: 13.6 % (ref 11.5–15.5)
WBC: 7.1 10*3/uL (ref 4.0–10.5)

## 2021-01-27 LAB — COMPREHENSIVE METABOLIC PANEL
ALT: 16 U/L (ref 0–53)
AST: 16 U/L (ref 0–37)
Albumin: 4.3 g/dL (ref 3.5–5.2)
Alkaline Phosphatase: 48 U/L (ref 39–117)
BUN: 15 mg/dL (ref 6–23)
CO2: 29 mEq/L (ref 19–32)
Calcium: 9.9 mg/dL (ref 8.4–10.5)
Chloride: 100 mEq/L (ref 96–112)
Creatinine, Ser: 1.16 mg/dL (ref 0.40–1.50)
GFR: 66.13 mL/min (ref >60.00)
Glucose, Bld: 103 mg/dL — ABNORMAL HIGH (ref 70–99)
Potassium: 4.7 mEq/L (ref 3.5–5.1)
Sodium: 136 mEq/L (ref 135–145)
Total Bilirubin: 0.5 mg/dL (ref 0.2–1.2)
Total Protein: 6.9 g/dL (ref 6.0–8.3)

## 2021-01-27 LAB — LIPID PANEL
Cholesterol: 160 mg/dL (ref 0–200)
HDL: 64.6 mg/dL (ref >39.00)
LDL Cholesterol: 83 mg/dL (ref 0–99)
NonHDL: 95.03
Total CHOL/HDL Ratio: 2
Triglycerides: 58 mg/dL (ref 0.0–149.0)
VLDL: 11.6 mg/dL (ref 0.0–40.0)

## 2021-01-27 LAB — HEMOGLOBIN A1C: Hgb A1c MFr Bld: 5.7 % (ref 4.6–6.5)

## 2021-01-27 LAB — PSA: PSA: 0.83 ng/mL (ref 0.10–4.00)

## 2021-01-27 MED ORDER — AMOXICILLIN-POT CLAVULANATE 875-125 MG PO TABS: 1.0000 | ORAL_TABLET | Freq: Two times a day (BID) | ORAL | 0 refills | Status: AC

## 2021-01-27 NOTE — Progress Notes (Signed)
Phone [phone] In person visit   Subjective:   Ricardo Garcia is a 65 y.o. year old very pleasant male patient who presents for/with See problem oriented charting Chief Complaint  Patient presents with  . Sinus Problem    Patient states that he has a chronic sinus issue and he had an antibiotic In December but He doesn't think it cleared the sinus issue completely. Would like to discuss .     This visit occurred during the SARS-CoV-2 public health emergency.  Safety protocols were in place, including screening questions prior to the visit, additional usage of staff PPE, and extensive cleaning of exam room while observing appropriate contact time as indicated for disinfecting solutions.   Past Medical History-  Patient Active Problem List   Diagnosis Date Noted  . ILD (interstitial lung disease) ([place]) 03/07/2017    Priority: High  . Hemoptysis 12/11/2016    Priority: High  .  PULMONARY INFILTRATES...BILATERAL LOWER LOBES 12/11/2016    Priority: High  . Lung nodules 12/11/2016    Priority: High  . PVC's (premature ventricular contractions) 07/06/2019    Priority: Medium  . Eczema 04/15/2017    Priority: Medium  . Sinusitis, chronic 07/28/2013    Priority: Medium  . Hypertension 01/26/2012    Priority: Medium  . Hyperlipidemia 01/26/2012    Priority: Medium    Medications- reviewed and updated Current Outpatient Medications  Medication Sig Dispense Refill  . amoxicillin-clavulanate (AUGMENTIN) 875-125 MG tablet Take 1 tablet by mouth 2 (two) times daily for 14 days. 28 tablet 0  . Bromelains 500 MG TABS Take 1 tablet by mouth daily.     . clobetasol (TEMOVATE) 0.05 % external solution Apply 1 application topically 2 (two) times daily. 7 days maximum. For scalp (Patient taking differently: Apply 1 application topically daily as needed. As needed.) 50 mL 2  . diltiazem (CARDIZEM CD) 120 MG 24 hr capsule TAKE 1 CAPSULE BY MOUTH  DAILY 90 capsule 3  . diltiazem (TIAZAC) 120  MG 24 hr capsule Take by mouth.    . halobetasol (ULTRAVATE) 0.05 % cream Apply topically 2 (two) times daily. For eczema. 7 days maximum (Patient taking differently: Apply topically as needed. PRN) 50 g 2  . lisinopril (ZESTRIL) 20 MG tablet TAKE 1 TABLET BY MOUTH  DAILY 90 tablet 3  . rosuvastatin (CRESTOR) 10 MG tablet TAKE 1 TABLET BY MOUTH  DAILY 90 tablet 3  . sildenafil (REVATIO) 20 MG tablet Take 1-5 tablets as needed once every 48 hours for erectile dysfunction 25 tablet 3  . VITAMIN D PO Take by mouth.     No current facility-administered medications for this visit.     Objective:  BP 128/78   Pulse 77   Temp 98 F (36.7 C) (Temporal)   Ht 6\' 2"  (1.88 m)   Wt 240 lb 12.8 oz (109.2 kg)   SpO2 93%   BMI 30.92 kg/m  Gen: NAD, resting comfortably Nasal turbinates erythematous with yellow discharge in right nare-bilateral nares erythematous and swollen.  No significant sinus pressure with tapping    Assessment and Plan   # Chronic pansinsitus S:ENT in the past even with 2nd opinion [facility] ear nose and throat- Dr. Javier Glazier and Dr. Leroy Sea in the past )- even when he is "well" does not feel fully well- has seen tem intermittently. Cutting down on alcohol helped sinuses some. Sinus rinse worsened symptoms. Tends to do well with augmentin. Has had allergy testing.   Saw Inda Coke  11/30/2020. Was negative for Covid prior to visit.No cough, wheezing, shortness of breath. Treated with a course of Augmentin for 10 days.  Symptoms were knocked down but a week or two later worsened again. Currently doing slightly better. Still coughing at least 10 minutes every morning - uses steaming and humidifier to try to help. Gilford Rile are the worst for him.  A/P: patient with ongoing issues with sinusitis but with recent exacerbation only partially responsive to 10 days of Augmentin. Offered ENT referral- he declines. Discussed tertiary care referral but has had bad experience at [facility] in the  past.  Since he improved on augmentin we opted to try a 14 day course to see if this would  Give him somre more prolonged relief. Quinolones not a great choice with levaquin allergy. Could potentially try a course of prednisone as well from an inflammation perspective  Recommended follow up: As needed for acute concern  Lab/Order associations:   ICD-10-CM   1. Chronic pansinusitis  J32.4    Meds ordered this encounter  Medications  . amoxicillin-clavulanate (AUGMENTIN) 875-125 MG tablet    Sig: Take 1 tablet by mouth 2 (two) times daily for 14 days.    Dispense:  28 tablet    Refill:  0   Return precautions advised.  Garret Reddish, MD

## 2021-01-30 ENCOUNTER — Other Ambulatory Visit: Payer: Self-pay

## 2021-01-30 ENCOUNTER — Encounter: Payer: Self-pay | Admitting: Family Medicine

## 2021-01-30 MED ORDER — LISINOPRIL 20 MG PO TABS: 20.0000 mg | ORAL_TABLET | Freq: Every day | ORAL | 3 refills | Status: DC

## 2021-01-30 MED ORDER — DILTIAZEM HCL ER COATED BEADS 120 MG PO CP24: 120.0000 mg | ORAL_CAPSULE | Freq: Every day | ORAL | 3 refills | Status: DC

## 2021-01-30 MED ORDER — ROSUVASTATIN CALCIUM 10 MG PO TABS: 10.0000 mg | ORAL_TABLET | Freq: Every day | ORAL | 3 refills | Status: DC

## 2021-03-30 ENCOUNTER — Encounter: Payer: Self-pay | Admitting: Family Medicine

## 2021-09-04 ENCOUNTER — Encounter: Payer: Self-pay | Admitting: Family Medicine

## 2021-10-19 ENCOUNTER — Encounter: Payer: Self-pay | Admitting: Family Medicine

## 2021-11-17 ENCOUNTER — Other Ambulatory Visit: Payer: Self-pay

## 2021-11-17 ENCOUNTER — Ambulatory Visit
Admission: EM | Admit: 2021-11-17 | Discharge: 2021-11-17 | Disposition: A | Discharge status: Home / Self Care | Payer: Medicare Other | Attending: Physician Assistant | Admitting: Physician Assistant

## 2021-11-17 ENCOUNTER — Encounter: Payer: Self-pay | Admitting: Emergency Medicine

## 2021-11-17 DIAGNOSIS — J019 Acute sinusitis, unspecified: Secondary | ICD-10-CM

## 2021-11-17 DIAGNOSIS — R197 Diarrhea, unspecified: Secondary | ICD-10-CM | POA: Diagnosis not present

## 2021-11-17 LAB — POCT INFLUENZA A/B
Influenza A, POC: NEGATIVE
Influenza B, POC: NEGATIVE

## 2021-11-17 MED ORDER — AMOXICILLIN-POT CLAVULANATE 875-125 MG PO TABS: 1.0000 | ORAL_TABLET | Freq: Two times a day (BID) | ORAL | 0 refills | Status: DC

## 2021-11-17 NOTE — ED Triage Notes (Signed)
Patient c/o sinus congestion, green drainage x 1 week, body chills, diarrhea x 2 days.  Home COVID test was negative.  Patient is vaccinated for COVID.

## 2021-11-17 NOTE — ED Provider Notes (Signed)
[facility]    CSN: [id number] Arrival date & time: 11/17/21  0948      History   Chief Complaint Chief Complaint  Patient presents with   Facial Pain    HPI Ricardo Garcia is a 65 y.o. male.   Patient here today for evaluation of nasal congestion and pressure that has been chronic but has been worse the last week or so. He reports that he has seen ENT for chronic sinus issues but reports they have not concluded why he has congestion so frequently. He does report occasional sinusitis and states current symptoms are consistent with same. He denies sore throat. He has not had cough. He does report a few days of diarrhea but this seems to be improving with time.   The history is provided by the patient.   Past Medical History:  Diagnosis Date   Diverticulitis    Hyperlipidemia    Hypertension    PONV (postoperative nausea and vomiting)    when he was 65 years old    Patient Active Problem List   Diagnosis Date Noted   PVC's (premature ventricular contractions) 07/06/2019   Eczema 04/15/2017   ILD (interstitial lung disease) ([place]) 03/07/2017   Hemoptysis 12/11/2016    PULMONARY INFILTRATES...BILATERAL LOWER LOBES 12/11/2016   Lung nodules 12/11/2016   Sinusitis, chronic 07/28/2013   Hypertension 01/26/2012   Hyperlipidemia 01/26/2012    Past Surgical History:  Procedure Laterality Date    bone spur removal     2016- foot   ARTHROTOMY Right 11/15/2015   Procedure: RIGHT ANKLE ARTHROTOMY ;  Surgeon: Melrose Nakayama, MD;  Location: [place];  Service: Orthopedics;  Laterality: Right;   balloon septoplasty     COLONOSCOPY  2011   ELECTROMAGNETIC NAVIGATION BROCHOSCOPY  12/20/2016   FOREIGN BODY REMOVAL Right 11/15/2015   Procedure: RIGHT ANKLE LOOSE BODY REMOVAL;  Surgeon: Melrose Nakayama, MD;  Location: [place];  Service: Orthopedics;  Laterality: Right;   HERNIA REPAIR     65 years old   LUNG BIOPSY Right 03/07/2017   Procedure: RIGHT LUNG BIOPSY;  Surgeon:  Melrose Nakayama, MD;  Location: [place];  Service: Thoracic;  Laterality: Right;   LYMPH NODE DISSECTION Right 03/07/2017   Procedure: LYMPH NODE DISSECTION, right lung;  Surgeon: Melrose Nakayama, MD;  Location: [place];  Service: Thoracic;  Laterality: Right;   SEGMENTECOMY Right 03/07/2017   Procedure: RIGHT LOWER LOBE SUPERIOR SEGMENTECTOMY;  Surgeon: Melrose Nakayama, MD;  Location: [place];  Service: Thoracic;  Laterality: Right;   VIDEO ASSISTED THORACOSCOPY (VATS)/WEDGE RESECTION Right 03/07/2017   Procedure: RIGHT LUNG VIDEO ASSISTED THORACOSCOPY (VATS)/WEDGE RESECTION;  Surgeon: Melrose Nakayama, MD;  Location: [place];  Service: Thoracic;  Laterality: Right;       Home Medications    Prior to Admission medications   Medication Sig Start Date End Date Taking? Authorizing Provider  amoxicillin-clavulanate (AUGMENTIN) 875-125 MG tablet Take 1 tablet by mouth every 12 (twelve) hours. 11/17/21  Yes Francene Finders, PA-C  Bromelains 500 MG TABS Take 1 tablet by mouth daily.    Yes [provider]  clobetasol (TEMOVATE) 0.05 % external solution Apply 1 application topically 2 (two) times daily. 7 days maximum. For scalp Patient taking differently: Apply 1 application topically daily as needed. As needed. 06/21/20  Yes Marin Olp, MD  diltiazem (CARDIZEM CD) 120 MG 24 hr capsule Take 1 capsule (120 mg total) by mouth daily. 01/30/21  Yes Marin Olp,  MD  halobetasol (ULTRAVATE) 0.05 % cream Apply topically 2 (two) times daily. For eczema. 7 days maximum Patient taking differently: Apply topically as needed. PRN 06/20/20  Yes Marin Olp, MD  lisinopril (ZESTRIL) 20 MG tablet Take 1 tablet (20 mg total) by mouth daily. 01/30/21  Yes Marin Olp, MD  rosuvastatin (CRESTOR) 10 MG tablet Take 1 tablet (10 mg total) by mouth daily. 01/30/21  Yes Marin Olp, MD  sildenafil (REVATIO) 20 MG tablet Take 1-5 tablets as needed once every 48 hours for erectile  dysfunction 10/27/19  Yes Marin Olp, MD  VITAMIN D PO Take by mouth.   Yes [provider]    Family History Family History  Problem Relation Age of Onset   Hypertension Mother        uncontrolled   Stroke Mother        37   Other Mother        died- fell after stroke and broke ribs- "lungs filled with fluid"   Other Father        unknown cause of death age 79   Rheum arthritis Father    Other Brother        some step siblingsonly    Social History Social History   Tobacco Use   Smoking status: Never   Smokeless tobacco: Never  Substance Use Topics   Alcohol use: Never    Alcohol/week: 8.0 standard drinks    Types: 1 Glasses of wine, 7 Standard drinks or equivalent per week    Comment: had been daily- quit 2019   Drug use: No     Allergies   Levaquin [levofloxacin]   Review of Systems Review of Systems  Constitutional:  Negative for chills and fever.  HENT:  Positive for congestion and sinus pressure.   Eyes:  Negative for discharge and redness.  Respiratory:  Negative for cough and shortness of breath.   Gastrointestinal:  Positive for diarrhea. Negative for nausea and vomiting.    Physical Exam Triage Vital Signs ED Triage Vitals  Enc Vitals Group     BP 11/17/21 1111 133/69     Pulse Rate 11/17/21 1111 72     Resp --      Temp 11/17/21 1111 98 F (36.7 C)     Temp Source 11/17/21 1111 Oral     SpO2 11/17/21 1111 99 %     Weight 11/17/21 1113 230 lb (104.3 kg)     Height 11/17/21 1113 6\' 2"  (1.88 m)     Head Circumference --      Peak Flow --      Pain Score 11/17/21 1112 2     Pain Loc --      Pain Edu? --      Excl. in [place]? --    No data found.  Updated Vital Signs BP 133/69 (BP Location: Left Arm)   Pulse 72   Temp 98 F (36.7 C) (Oral)   Ht 6\' 2"  (1.88 m)   Wt 230 lb (104.3 kg)   SpO2 99%   BMI 29.53 kg/m   Physical Exam Vitals and nursing note reviewed.  Constitutional:      General: He is not in acute  distress.    Appearance: Normal appearance. He is not ill-appearing.  HENT:     Head: Normocephalic and atraumatic.     Nose: Congestion present.     Mouth/Throat:     Mouth: Mucous membranes are moist.  Pharynx: Oropharynx is clear. No oropharyngeal exudate or posterior oropharyngeal erythema.  Eyes:     Conjunctiva/sclera: Conjunctivae normal.  Cardiovascular:     Rate and Rhythm: Normal rate and regular rhythm.     Heart sounds: Normal heart sounds. No murmur heard. Pulmonary:     Effort: Pulmonary effort is normal. No respiratory distress.     Breath sounds: Normal breath sounds. No wheezing, rhonchi or rales.  Skin:    General: Skin is warm and dry.  Neurological:     Mental Status: He is alert.  Psychiatric:        Mood and Affect: Mood normal.        Thought Content: Thought content normal.     UC Treatments / Results  Labs (all labs ordered are listed, but only abnormal results are displayed) Labs Reviewed  POCT INFLUENZA A/B    EKG   Radiology No results found.  Procedures Procedures (including critical care time)  Medications Ordered in UC Medications - No data to display  Initial Impression / Assessment and Plan / UC Course  I have reviewed the triage vital signs and the nursing notes.  Pertinent labs & imaging results that were available during my care of the patient were reviewed by me and considered in my medical decision making (see chart for details).   Will treat to cover sinusitis with Augmentin. Recommend follow up if any of his symptoms persist or worsen.   Final Clinical Impressions(s) / UC Diagnoses   Final diagnoses:  Acute sinusitis, recurrence not specified, unspecified location  Diarrhea, unspecified type   Discharge Instructions   None    ED Prescriptions     Medication Sig Dispense Auth. Provider   amoxicillin-clavulanate (AUGMENTIN) 875-125 MG tablet Take 1 tablet by mouth every 12 (twelve) hours. 14 tablet Francene Finders, PA-C      PDMP not reviewed this encounter.   Francene Finders, PA-C 11/17/21 [phone]

## 2022-01-21 ENCOUNTER — Encounter: Payer: Self-pay | Admitting: Family Medicine

## 2022-01-22 ENCOUNTER — Other Ambulatory Visit: Payer: Self-pay

## 2022-01-22 MED ORDER — DILTIAZEM HCL ER COATED BEADS 120 MG PO CP24: 120.0000 mg | ORAL_CAPSULE | Freq: Every day | ORAL | 3 refills | Status: DC

## 2022-01-24 NOTE — Progress Notes (Signed)
Phone [phone] In person visit   Subjective:   Ricardo Garcia is a 66 y.o. year old very pleasant male patient who presents for/with See problem oriented charting Chief Complaint  Patient presents with   Hypertension   Hyperlipidemia    This visit occurred during the SARS-CoV-2 public health emergency.  Safety protocols were in place, including screening questions prior to the visit, additional usage of staff PPE, and extensive cleaning of exam room while observing appropriate contact time as indicated for disinfecting solutions.   Past Medical History-  Patient Active Problem List   Diagnosis Date Noted   ILD (interstitial lung disease) ([place]) 03/07/2017    Priority: High   Hemoptysis 12/11/2016    Priority: High    PULMONARY INFILTRATES...BILATERAL LOWER LOBES 12/11/2016    Priority: High   Lung nodules 12/11/2016    Priority: High   PVC's (premature ventricular contractions) 07/06/2019    Priority: Medium    Eczema 04/15/2017    Priority: Medium    Sinusitis, chronic 07/28/2013    Priority: Medium    Hypertension 01/26/2012    Priority: Medium    Hyperlipidemia 01/26/2012    Priority: Medium     Medications- reviewed and updated Current Outpatient Medications  Medication Sig Dispense Refill   Bromelains 500 MG TABS Take 1 tablet by mouth daily.      clobetasol (TEMOVATE) 0.05 % external solution Apply 1 application topically 2 (two) times daily. 7 days maximum. For scalp (Patient taking differently: Apply 1 application topically daily as needed. As needed.) 50 mL 2   diltiazem (CARDIZEM CD) 120 MG 24 hr capsule Take 1 capsule (120 mg total) by mouth daily. 90 capsule 3   halobetasol (ULTRAVATE) 0.05 % cream Apply topically 2 (two) times daily. For eczema. 7 days maximum (Patient taking differently: Apply topically as needed. PRN) 50 g 2   lisinopril (ZESTRIL) 20 MG tablet Take 1 tablet (20 mg total) by mouth daily. 90 tablet 3   rosuvastatin (CRESTOR) 10 MG tablet  Take 1 tablet (10 mg total) by mouth daily. 90 tablet 3   sildenafil (REVATIO) 20 MG tablet Take 1-5 tablets as needed once every 48 hours for erectile dysfunction 25 tablet 3   VITAMIN D PO Take by mouth.     No current facility-administered medications for this visit.     Objective:  BP 110/80 (BP Location: Left Arm, Patient Position: Sitting, Cuff Size: Large)    Pulse 71    Temp 98 F (36.7 C) (Temporal)    Ht 6\' 2"  (1.88 m)    Wt 237 lb (107.5 kg)    SpO2 97%    BMI 30.43 kg/m  Gen: NAD, resting comfortably Some sinus turbinate edema CV: RRR no murmurs rubs or gallops Lungs: CTAB no crackles, wheeze, rhonchi Abdomen: soft/nontender/nondistended/normal bowel sounds. No rebound or guarding.  Ext: no edema Skin: warm, dry Neuro: grossly normal, moves all extremities   Assessment and Plan  66 y.o. male presenting for annual follow up  Health Maintenance counseling: 1. Anticipatory guidance: Patient counseled regarding regular dental exams -q6 months, eye exams -yearly ,  avoiding smoking and second hand smoke , limiting alcohol to 2 beverages per day.  No illicit drugs.  2. Risk factor reduction:  Advised patient of need for regular exercise and diet rich and fruits and vegetables to reduce risk of heart attack and stroke.  Exercise- last year up to 4 days a week, now up to most days.  Diet/weight management- down  3 lbs from last year- had gotten as low as 221 then had set back with holidays. Doing low carb now and has lost another 4 lbs.  Wt Readings from Last 3 Encounters:  01/31/22 237 lb (107.5 kg)  11/17/21 230 lb (104.3 kg)  01/27/21 240 lb 12.8 oz (109.2 kg)  3. Immunizations/screenings/ancillary studies DISCUSSED:  -COVID booster vaccination #5- bivalent recommended at pharmacy. Had covid in July  - since he has had prevnar 13 and pneumovax 23 - does not require prevnar 20 Immunization History  Administered Date(s) Administered   Influenza Inj Mdck Quad Pf 10/29/2017    Influenza Split 10/16/2012, 09/23/2013, 12/24/2016   Influenza, Seasonal, Injecte, Preservative Fre 10/01/2015, 12/24/2016   Influenza,inj,Quad PF,6+ Mos 10/01/2015, 10/21/2018, 09/14/2019, 09/29/2020   Influenza-Unspecified 10/18/2014, 10/04/2021   PFIZER Comirnaty(Gray Top)Covid-19 Tri-Sucrose Vaccine 03/30/2021   PFIZER(Purple Top)SARS-COV-2 Vaccination 02/14/2020, 03/09/2020, 10/14/2020, 03/30/2021   Pneumococcal Conjugate-13 01/27/2021   Pneumococcal Polysaccharide-23 02/11/2017   Tdap 12/24/2010   Zoster Recombinat (Shingrix) 11/01/2017, 01/31/2018   Zoster, Live 12/24/2010  4. Prostate cancer screening- low risk PSA trend-can defer rectal exam as long as trend low risk. Nocturia 2-3x a night stable- maybe 4 Lab Results  Component Value Date   PSA 0.83 01/27/2021   PSA 0.80 10/27/2019   PSA 0.86 10/21/2018   5. Colon cancer screening - 01/19/10 with 10 year repeat planned - sees Dr. Benson Norway- then with negative cologuard 05/19/20 will be due next year 6. Skin cancer screening- -[facility] now. advised regular sunscreen use. Denies worrisome, changing, or new skin lesions.  7. Smoking associated screening (lung cancer screening, AAA screen 65-75, UA)- Never smoker 8. STD screening - declines  Status of chronic or acute concerns   #hypertension S: medication: lisinorpil 20Mg , diltiazem 120Mg  Home readings #s: has monitor BP Readings from Last 3 Encounters:  01/31/22 110/80  11/17/21 133/69  01/27/21 128/78  A/P: Controlled. Continue current medications. Recommended at least monthly checks  #palpitations determined to be PACs/PVCs and on diltiazem and followed by [facility] in past- very rare issues- hasnt seen cards lately.   #hyperlipidemia S: Medication: rosuvastatin 10mg . Myalgias in past- tolerates these Lab Results  Component Value Date   CHOL 160 01/27/2021   HDL 64.60 01/27/2021   LDLCALC 83 01/27/2021   TRIG 58.0 01/27/2021   CHOLHDL 2 01/27/2021   A/P: slightly  above goals on last lipids- update lipid panel today. Hesitant to increase dose with myalgias history and prediabetes risk  # Hyperglycemia/insulin resistance/prediabetes- tend a1c- lower end of risk on last check Lab Results  Component Value Date   HGBA1C 5.7 01/27/2021   # ILD S:slides were lost unfortunately- [facility], [place] chest. No lingered issues though- may had been infectious  A/P: appears controlled- continue to monitor  #Eczema- intermittent flare ups- steroid cream was helpful- can refill as needed- 10 days max- has had bad flares in past that responded to tanning bed short term 2 visits- no known history psoriasis   ED- low dose sildenafil trialed last year (has on hand but not trying)  HA on cialis   #sinusitis- chronic issues- has needed augmentin a few times over last year- including one in November on epic  Recommended follow up: Return in about 1 year (around 01/31/2023) for follow up- or sooner if needed.    Chief Complaint  Patient presents with   Hypertension   Hyperlipidemia   Lab/Order associations: fasting   ICD-10-CM   1. Preventative health care  Z00.00  2. Primary hypertension  I10     3. Hyperlipidemia, unspecified hyperlipidemia type  E78.5     4. Palpitation  R00.2     5. Hyperglycemia  R73.9     6. ILD (interstitial lung disease) ([place])  J84.9     7. Nocturia  R35.1     8. PVC's (premature ventricular contractions)  I49.3      No orders of the defined types were placed in this encounter.  I,Jada Bradford,acting as a scribe for Garret Reddish, MD.,have documented all relevant documentation on the behalf of Garret Reddish, MD,as directed by  Garret Reddish, MD while in the presence of Garret Reddish, MD.  I, Garret Reddish, MD, have reviewed all documentation for this visit. The documentation on 01/31/22 for the exam, diagnosis, procedures, and orders are all accurate and complete.  Return precautions advised.  Garret Reddish,  MD

## 2022-01-25 ENCOUNTER — Encounter: Payer: Self-pay | Admitting: Family Medicine

## 2022-01-26 ENCOUNTER — Other Ambulatory Visit: Payer: Self-pay

## 2022-01-26 MED ORDER — LISINOPRIL 20 MG PO TABS: 20.0000 mg | ORAL_TABLET | Freq: Every day | ORAL | 3 refills | Status: DC

## 2022-01-26 MED ORDER — ROSUVASTATIN CALCIUM 10 MG PO TABS: 10.0000 mg | ORAL_TABLET | Freq: Every day | ORAL | 3 refills | Status: DC

## 2022-01-29 ENCOUNTER — Encounter: Payer: Self-pay | Admitting: Family Medicine

## 2022-01-29 ENCOUNTER — Other Ambulatory Visit: Payer: Self-pay

## 2022-01-29 MED ORDER — ROSUVASTATIN CALCIUM 10 MG PO TABS: 10.0000 mg | ORAL_TABLET | Freq: Every day | ORAL | 3 refills | Status: DC

## 2022-01-29 MED ORDER — LISINOPRIL 20 MG PO TABS: 20.0000 mg | ORAL_TABLET | Freq: Every day | ORAL | 3 refills | Status: DC

## 2022-01-31 ENCOUNTER — Encounter: Payer: Self-pay | Admitting: Family Medicine

## 2022-01-31 ENCOUNTER — Ambulatory Visit (INDEPENDENT_AMBULATORY_CARE_PROVIDER_SITE_OTHER): Payer: Medicare Other | Admitting: Family Medicine

## 2022-01-31 ENCOUNTER — Other Ambulatory Visit: Payer: Self-pay

## 2022-01-31 VITALS — BP 110/80 | HR 71 | Temp 98.0°F | Ht 74.0 in | Wt 237.0 lb

## 2022-01-31 DIAGNOSIS — I10 Essential (primary) hypertension: Secondary | ICD-10-CM | POA: Diagnosis not present

## 2022-01-31 DIAGNOSIS — R002 Palpitations: Secondary | ICD-10-CM | POA: Diagnosis not present

## 2022-01-31 DIAGNOSIS — R739 Hyperglycemia, unspecified: Secondary | ICD-10-CM | POA: Diagnosis not present

## 2022-01-31 DIAGNOSIS — I493 Ventricular premature depolarization: Secondary | ICD-10-CM

## 2022-01-31 DIAGNOSIS — J849 Interstitial pulmonary disease, unspecified: Secondary | ICD-10-CM

## 2022-01-31 DIAGNOSIS — E785 Hyperlipidemia, unspecified: Secondary | ICD-10-CM | POA: Diagnosis not present

## 2022-01-31 DIAGNOSIS — Z0000 Encounter for general adult medical examination without abnormal findings: Secondary | ICD-10-CM

## 2022-01-31 DIAGNOSIS — R351 Nocturia: Secondary | ICD-10-CM

## 2022-01-31 LAB — CBC WITH DIFFERENTIAL/PLATELET
Basophils Absolute: 0.1 10*3/uL (ref 0.0–0.1)
Basophils Relative: 0.9 % (ref 0.0–3.0)
Eosinophils Absolute: 0.2 10*3/uL (ref 0.0–0.7)
Eosinophils Relative: 3.6 % (ref 0.0–5.0)
HCT: 44.5 % (ref 39.0–52.0)
Hemoglobin: 14.9 g/dL (ref 13.0–17.0)
Lymphocytes Relative: 17.6 % (ref 12.0–46.0)
Lymphs Abs: 1 10*3/uL (ref 0.7–4.0)
MCHC: 33.4 g/dL (ref 30.0–36.0)
MCV: 91.9 fl (ref 78.0–100.0)
Monocytes Absolute: 0.5 10*3/uL (ref 0.1–1.0)
Monocytes Relative: 8.8 % (ref 3.0–12.0)
Neutro Abs: 3.8 10*3/uL (ref 1.4–7.7)
Neutrophils Relative %: 69.1 % (ref 43.0–77.0)
Platelets: 225 10*3/uL (ref 150.0–400.0)
RBC: 4.84 Mil/uL (ref 4.22–5.81)
RDW: 14.1 % (ref 11.5–15.5)
WBC: 5.5 10*3/uL (ref 4.0–10.5)

## 2022-01-31 LAB — COMPREHENSIVE METABOLIC PANEL
ALT: 15 U/L (ref 0–53)
AST: 16 U/L (ref 0–37)
Albumin: 4.5 g/dL (ref 3.5–5.2)
Alkaline Phosphatase: 45 U/L (ref 39–117)
BUN: 19 mg/dL (ref 6–23)
CO2: 28 mEq/L (ref 19–32)
Calcium: 10.2 mg/dL (ref 8.4–10.5)
Chloride: 100 mEq/L (ref 96–112)
Creatinine, Ser: 1.27 mg/dL (ref 0.40–1.50)
GFR: 58.89 mL/min — ABNORMAL LOW (ref >60.00)
Glucose, Bld: 102 mg/dL — ABNORMAL HIGH (ref 70–99)
Potassium: 4.9 mEq/L (ref 3.5–5.1)
Sodium: 138 mEq/L (ref 135–145)
Total Bilirubin: 0.6 mg/dL (ref 0.2–1.2)
Total Protein: 7.3 g/dL (ref 6.0–8.3)

## 2022-01-31 LAB — HEMOGLOBIN A1C: Hgb A1c MFr Bld: 5.7 % (ref 4.6–6.5)

## 2022-01-31 LAB — LIPID PANEL
Cholesterol: 186 mg/dL (ref 0–200)
HDL: 70.2 mg/dL (ref >39.00)
LDL Cholesterol: 105 mg/dL — ABNORMAL HIGH (ref 0–99)
NonHDL: 116.24
Total CHOL/HDL Ratio: 3
Triglycerides: 56 mg/dL (ref 0.0–149.0)
VLDL: 11.2 mg/dL (ref 0.0–40.0)

## 2022-01-31 LAB — PSA: PSA: 0.95 ng/mL (ref 0.10–4.00)

## 2022-01-31 NOTE — Patient Instructions (Addendum)
Consider covid shot at pharmacy- bivalent booster  Please stop by lab before you go If you have mychart- we will send your results within 3 business days of [place] receiving them.  If you do not have mychart- we will call you about results within 5 business days of [place] receiving them.  *please also note that you will see labs on mychart as soon as they post. I will later go in and write notes on them- will say "notes from Dr. Yong Channel"   Recommended follow up: Return in about 1 year (around 01/31/2023) for follow up- or sooner if needed. As long as home BP averages <135/85 on at least monthly checks

## 2022-04-11 ENCOUNTER — Encounter: Payer: Self-pay | Admitting: Family Medicine

## 2022-04-11 NOTE — Telephone Encounter (Signed)
Tammy, see message. Do you know anything about [facility]? ?

## 2022-05-08 ENCOUNTER — Ambulatory Visit (INDEPENDENT_AMBULATORY_CARE_PROVIDER_SITE_OTHER): Payer: Medicare Other

## 2022-05-08 DIAGNOSIS — Z0000 Encounter for general adult medical examination without abnormal findings: Secondary | ICD-10-CM

## 2022-05-08 NOTE — Patient Instructions (Signed)
[name] , ?Thank you for taking time to come for your Medicare Wellness Visit. I appreciate your ongoing commitment to your health goals. Please review the following plan we discussed and let me know if I can assist you in the future.  ? ?Screening recommendations/referrals: ?Colonoscopy: Cologuard 05/19/20 repeat every 3 years  ?Recommended yearly ophthalmology/optometry visit for glaucoma screening and checkup ?Recommended yearly dental visit for hygiene and checkup ? ?Vaccinations: ?Influenza vaccine: Done 10/04/21 repeat every year  ?Pneumococcal vaccine: due and discussed  ?Tdap vaccine: Due and discussed ?Shingles vaccine: Completed 11/01/17, 01/31/18   ?Covid-19: Completed 2/21, 3/17, 10/14/20, 03/30/21 ? ?Advanced directives: Please bring a copy of your health care power of attorney and living will to the office at your convenience. ? ? ?Conditions/risks identified: lose weight  ? ?Next appointment: Follow up in one year for your annual wellness visit.  ? ?Preventive Care 67 Years and Older, Male ?Preventive care refers to lifestyle choices and visits with your health care provider that can promote health and wellness. ?What does preventive care include? ?A yearly physical exam. This is also called an annual well check. ?Dental exams once or twice a year. ?Routine eye exams. Ask your health care provider how often you should have your eyes checked. ?Personal lifestyle choices, including: ?Daily care of your teeth and gums. ?Regular physical activity. ?Eating a healthy diet. ?Avoiding tobacco and drug use. ?Limiting alcohol use. ?Practicing safe sex. ?Taking low doses of aspirin every day. ?Taking vitamin and mineral supplements as recommended by your health care provider. ?What happens during an annual well check? ?The services and screenings done by your health care provider during your annual well check will depend on your age, overall health, lifestyle risk factors, and family history of disease. ?Counseling   ?Your health care provider may ask you questions about your: ?Alcohol use. ?Tobacco use. ?Drug use. ?Emotional well-being. ?Home and relationship well-being. ?Sexual activity. ?Eating habits. ?History of falls. ?Memory and ability to understand (cognition). ?Work and work Statistician. ?Screening  ?You may have the following tests or measurements: ?Height, weight, and BMI. ?Blood pressure. ?Lipid and cholesterol levels. These may be checked every 5 years, or more frequently if you are over 44 years old. ?Skin check. ?Lung cancer screening. You may have this screening every year starting at age 30 if you have a 30-pack-year history of smoking and currently smoke or have quit within the past 15 years. ?Fecal occult blood test (FOBT) of the stool. You may have this test every year starting at age 63. ?Flexible sigmoidoscopy or colonoscopy. You may have a sigmoidoscopy every 5 years or a colonoscopy every 10 years starting at age 67. ?Prostate cancer screening. Recommendations will vary depending on your family history and other risks. ?Hepatitis C blood test. ?Hepatitis B blood test. ?Sexually transmitted disease (STD) testing. ?Diabetes screening. This is done by checking your blood sugar (glucose) after you have not eaten for a while (fasting). You may have this done every 1-3 years. ?Abdominal aortic aneurysm (AAA) screening. You may need this if you are a current or former smoker. ?Osteoporosis. You may be screened starting at age 88 if you are at high risk. ?Talk with your health care provider about your test results, treatment options, and if necessary, the need for more tests. ?Vaccines  ?Your health care provider may recommend certain vaccines, such as: ?Influenza vaccine. This is recommended every year. ?Tetanus, diphtheria, and acellular pertussis (Tdap, Td) vaccine. You may need a Td booster every 10 years. ?  Zoster vaccine. You may need this after age 28. ?Pneumococcal 13-valent conjugate (PCV13) vaccine.  One dose is recommended after age 58. ?Pneumococcal polysaccharide (PPSV23) vaccine. One dose is recommended after age 30. ?Talk to your health care provider about which screenings and vaccines you need and how often you need them. ?This information is not intended to replace advice given to you by your health care provider. Make sure you discuss any questions you have with your health care provider. ?Document Released: 01/06/2016 Document Revised: 08/29/2016 Document Reviewed: 10/11/2015 ?Elsevier Interactive Patient Education ? 2017 [place]. ? ?Fall Prevention in the Home ?Falls can cause injuries. They can happen to people of all ages. There are many things you can do to make your home safe and to help prevent falls. ?What can I do on the outside of my home? ?Regularly fix the edges of walkways and driveways and fix any cracks. ?Remove anything that might make you trip as you walk through a door, such as a raised step or threshold. ?Trim any bushes or trees on the path to your home. ?Use bright outdoor lighting. ?Clear any walking paths of anything that might make someone trip, such as rocks or tools. ?Regularly check to see if handrails are loose or broken. Make sure that both sides of any steps have handrails. ?Any raised decks and porches should have guardrails on the edges. ?Have any leaves, snow, or ice cleared regularly. ?Use sand or salt on walking paths during winter. ?Clean up any spills in your garage right away. This includes oil or grease spills. ?What can I do in the bathroom? ?Use night lights. ?Install grab bars by the toilet and in the tub and shower. Do not use towel bars as grab bars. ?Use non-skid mats or decals in the tub or shower. ?If you need to sit down in the shower, use a plastic, non-slip stool. ?Keep the floor dry. Clean up any water that spills on the floor as soon as it happens. ?Remove soap buildup in the tub or shower regularly. ?Attach bath mats securely with double-sided  non-slip rug tape. ?Do not have throw rugs and other things on the floor that can make you trip. ?What can I do in the bedroom? ?Use night lights. ?Make sure that you have a light by your bed that is easy to reach. ?Do not use any sheets or blankets that are too big for your bed. They should not hang down onto the floor. ?Have a firm chair that has side arms. You can use this for support while you get dressed. ?Do not have throw rugs and other things on the floor that can make you trip. ?What can I do in the kitchen? ?Clean up any spills right away. ?Avoid walking on wet floors. ?Keep items that you use a lot in easy-to-reach places. ?If you need to reach something above you, use a strong step stool that has a grab bar. ?Keep electrical cords out of the way. ?Do not use floor polish or wax that makes floors slippery. If you must use wax, use non-skid floor wax. ?Do not have throw rugs and other things on the floor that can make you trip. ?What can I do with my stairs? ?Do not leave any items on the stairs. ?Make sure that there are handrails on both sides of the stairs and use them. Fix handrails that are broken or loose. Make sure that handrails are as long as the stairways. ?Check any carpeting to make sure that  it is firmly attached to the stairs. Fix any carpet that is loose or worn. ?Avoid having throw rugs at the top or bottom of the stairs. If you do have throw rugs, attach them to the floor with carpet tape. ?Make sure that you have a light switch at the top of the stairs and the bottom of the stairs. If you do not have them, ask someone to add them for you. ?What else can I do to help prevent falls? ?Wear shoes that: ?Do not have high heels. ?Have rubber bottoms. ?Are comfortable and fit you well. ?Are closed at the toe. Do not wear sandals. ?If you use a stepladder: ?Make sure that it is fully opened. Do not climb a closed stepladder. ?Make sure that both sides of the stepladder are locked into place. ?Ask  someone to hold it for you, if possible. ?Clearly mark and make sure that you can see: ?Any grab bars or handrails. ?First and last steps. ?Where the edge of each step is. ?Use tools that help you move aro

## 2022-05-08 NOTE — Progress Notes (Signed)
Virtual Visit via Telephone Note ? ?I connected with  Ricardo Garcia on 05/08/22 at 11:15 AM EDT by telephone and verified that I am speaking with the correct person using two identifiers. ? ?Medicare Annual Wellness visit completed telephonically due to Covid-19 pandemic.  ? ?Persons participating in this call: This Health Coach and this patient.  ? ?Location: ?Patient: home ?Provider: office ?  ?I discussed the limitations, risks, security and privacy concerns of performing an evaluation and management service by telephone and the availability of in person appointments. The patient expressed understanding and agreed to proceed. ? ?Unable to perform video visit due to video visit attempted and failed and/or patient does not have video capability.  ? ?Some vital signs may be absent or patient reported.  ? ?Willette Brace, LPN ? ? ?Subjective:  ? Ricardo Garcia is a 66 y.o. male who presents for an Initial Medicare Annual Wellness Visit. ? ?Review of Systems    ? ?Cardiac Risk Factors include: advanced age (>95mn, >>45women);dyslipidemia;male gender;hypertension ? ?   ?Objective:  ?  ?There were no vitals filed for this visit. ?There is no height or weight on file to calculate BMI. ? ? ?  05/08/2022  ? 11:09 AM 03/09/2017  ?  5:00 PM 03/05/2017  ? 12:40 PM 11/14/2015  ?  8:22 AM  ?Advanced Directives  ?Does Patient Have a Medical Advance Directive? Yes Yes Yes Yes  ?Type of [place]aramed[place]of [place]Living will [place] will [place] will   ?Does patient want to make changes to medical advance directive?  No - Patient declined    ?Copy of [place] Chart? No - copy requested No - copy requested No - copy requested No - copy requested  ? ? ?Current Medications (verified) ?Outpatient Encounter Medications as of 05/08/2022  ?Medication Sig  ? Bromelains 500 MG TABS Take 1 tablet by mouth daily.   ? clobetasol (TEMOVATE) 0.05 %  external solution Apply 1 application topically 2 (two) times daily. 7 days maximum. For scalp (Patient taking differently: Apply 1 application. topically daily as needed. As needed.)  ? diltiazem (CARDIZEM CD) 120 MG 24 hr capsule Take 1 capsule (120 mg total) by mouth daily.  ? halobetasol (ULTRAVATE) 0.05 % cream Apply topically 2 (two) times daily. For eczema. 7 days maximum (Patient taking differently: Apply topically as needed. PRN)  ? lisinopril (ZESTRIL) 20 MG tablet Take 1 tablet (20 mg total) by mouth daily.  ? mometasone (ELOCON) 0.1 % cream Apply 1 application. topically daily.  ? rosuvastatin (CRESTOR) 10 MG tablet Take 1 tablet (10 mg total) by mouth daily.  ? sildenafil (REVATIO) 20 MG tablet Take 1-5 tablets as needed once every 48 hours for erectile dysfunction  ? VITAMIN D PO Take by mouth.  ? ?No facility-administered encounter medications on file as of 05/08/2022.  ? ? ?Allergies (verified) ?Levaquin [levofloxacin]  ? ?History: ?Past Medical History:  ?Diagnosis Date  ? Diverticulitis   ? Hyperlipidemia   ? Hypertension   ? PONV (postoperative nausea and vomiting)   ? when he was 66years old  ? ?Past Surgical History:  ?Procedure Laterality Date  ?  bone spur removal    ? 2016- foot  ? ARTHROTOMY Right 11/15/2015  ? Procedure: RIGHT ANKLE ARTHROTOMY ;  Surgeon: PMelrose Nakayama MD;  Location: [facility]  Service: Orthopedics;  Laterality: Right;  ? balloon septoplasty    ? COLONOSCOPY  2011  ? ELECTROMAGNETIC NAVIGATION BROCHOSCOPY  12/20/2016  ? FOREIGN BODY REMOVAL Right 11/15/2015  ? Procedure: RIGHT ANKLE LOOSE BODY REMOVAL;  Surgeon: Melrose Nakayama, MD;  Location: [facility];  Service: Orthopedics;  Laterality: Right;  ? HERNIA REPAIR    ? 66 years old  ? LUNG BIOPSY Right 03/07/2017  ? Procedure: RIGHT LUNG BIOPSY;  Surgeon: Melrose Nakayama, MD;  Location: [facility];  Service: Thoracic;  Laterality: Right;  ? LYMPH NODE DISSECTION Right 03/07/2017  ? Procedure: LYMPH NODE DISSECTION, right lung;   Surgeon: Melrose Nakayama, MD;  Location: [facility];  Service: Thoracic;  Laterality: Right;  ? SEGMENTECOMY Right 03/07/2017  ? Procedure: RIGHT LOWER LOBE SUPERIOR SEGMENTECTOMY;  Surgeon: Melrose Nakayama, MD;  Location: [facility];  Service: Thoracic;  Laterality: Right;  ? VIDEO ASSISTED THORACOSCOPY (VATS)/WEDGE RESECTION Right 03/07/2017  ? Procedure: RIGHT LUNG VIDEO ASSISTED THORACOSCOPY (VATS)/WEDGE RESECTION;  Surgeon: Melrose Nakayama, MD;  Location: [facility];  Service: Thoracic;  Laterality: Right;  ? ?Family History  ?Problem Relation Age of Onset  ? Hypertension Mother   ?     uncontrolled  ? Stroke Mother   ?     18  ? Other Mother   ?     died- fell after stroke and broke ribs- "lungs filled with fluid"  ? Other Father   ?     unknown cause of death age 11  ? Rheum arthritis Father   ? Other Brother   ?     some step siblingsonly  ? ?Social History  ? ?Socioeconomic History  ? Marital status: Divorced  ?  Spouse name: Not on file  ? Number of children: Not on file  ? Years of education: Not on file  ? Highest education level: Not on file  ?Occupational History  ? Not on file  ?Tobacco Use  ? Smoking status: Never  ? Smokeless tobacco: Never  ?Substance and Sexual Activity  ? Alcohol use: Never  ?  Alcohol/week: 8.0 standard drinks  ?  Types: 1 Glasses of wine, 7 Standard drinks or equivalent per week  ?  Comment: had been daily- quit 2019  ? Drug use: No  ? Sexual activity: Yes  ?  Partners: Female  ?Other Topics Concern  ? Not on file  ?Social History Narrative  ? Divorced. Lives alone. 2 children- son 85 at [place], daughter 57- [employer] of [employer].   ?   ? 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   ? Hobbies: gym 4-5 days a week, guitar, time with friends and family  ? ?Social Determinants of Health  ? ?Financial Resource Strain: Low Risk   ? Difficulty of Paying Living Expenses: Not hard at all  ?Food Insecurity: No Food Insecurity  ? Worried About Charity fundraiser  in the Last Year: Never true  ? Ran Out of Food in the Last Year: Never true  ?Transportation Needs: No Transportation Needs  ? Lack of Transportation (Medical): No  ? Lack of Transportation (Non-Medical): No  ?Physical Activity: Sufficiently Active  ? Days of Exercise per Week: 4 days  ? Minutes of Exercise per Session: 50 min  ?Stress: No Stress Concern Present  ? Feeling of Stress : Not at all  ?Social Connections: Moderately Isolated  ? Frequency of Communication with Friends and Family: Once a week  ? Frequency of Social Gatherings with Friends and Family: More than three  times a week  ? Attends Religious Services: Never  ? Active Member of Clubs or Organizations: Yes  ? Attends Archivist Meetings: 1 to 4 times per year  ? Marital Status: Divorced  ? ? ?Tobacco Counseling ?Counseling given: Not Answered ? ? ?Clinical Intake: ? ?Pre-visit preparation completed: Yes ? ?Pain : No/denies pain ? ?  ? ?BMI - recorded: 30.43 ?Nutritional Status: BMI > 30  Obese ?Nutritional Risks: None ?Diabetes: No ? ?How often do you need to have someone help you when you read instructions, pamphlets, or other written materials from your doctor or pharmacy?: 1 - Never ? ?Diabetic?no ? ?Interpreter Needed?: No ? ?Information entered by :: Charlott Rakes, LPN ? ? ?Activities of Daily Living ? ?  05/08/2022  ? 11:10 AM  ?In your present state of health, do you have any difficulty performing the following activities:  ?Hearing? 0  ?Vision? 0  ?Difficulty concentrating or making decisions? 0  ?Walking or climbing stairs? 0  ?Dressing or bathing? 0  ?Doing errands, shopping? 0  ?Preparing Food and eating ? N  ?Using the Toilet? N  ?In the past six months, have you accidently leaked urine? N  ?Do you have problems with loss of bowel control? N  ?Managing your Medications? N  ?Managing your Finances? N  ?Housekeeping or managing your Housekeeping? N  ? ? ?Patient Care Team: ?Marin Olp, MD as PCP - General (Family  Medicine) ?Melrose Nakayama, MD as Consulting Physician (Cardiothoracic Surgery) ? ?Indicate any recent Medical Services you may have received from other than [facility] providers in the past year (date may be appro

## 2022-05-23 ENCOUNTER — Encounter: Payer: Self-pay | Admitting: Family Medicine

## 2022-05-23 MED ORDER — AMOXICILLIN-POT CLAVULANATE 875-125 MG PO TABS: 1.0000 | ORAL_TABLET | Freq: Two times a day (BID) | ORAL | 0 refills | Status: DC

## 2022-06-13 ENCOUNTER — Ambulatory Visit (INDEPENDENT_AMBULATORY_CARE_PROVIDER_SITE_OTHER): Payer: Medicare Other | Admitting: Physician Assistant

## 2022-06-13 ENCOUNTER — Encounter: Payer: Self-pay | Admitting: Physician Assistant

## 2022-06-13 VITALS — BP 110/66 | HR 81 | Temp 97.6°F | Ht 74.0 in | Wt 235.2 lb

## 2022-06-13 DIAGNOSIS — J324 Chronic pansinusitis: Secondary | ICD-10-CM | POA: Diagnosis not present

## 2022-06-13 MED ORDER — DOXYCYCLINE HYCLATE 100 MG PO TABS: 100.0000 mg | ORAL_TABLET | Freq: Two times a day (BID) | ORAL | 0 refills | Status: DC

## 2022-06-13 NOTE — Progress Notes (Signed)
Ricardo Garcia is a 66 y.o. male here for a follow up on nasal congestion.  History of Present Illness:   Chief Complaint  Patient presents with   Sinus Problem    Pt c/o sinus congestion, headache, fatigue, facial pain. Pt took round of amoxicillin 5/31.    HPI   Sinus Problem Patient complain of sinus congestion that has been onset for 3 weeks. His associated symptoms include headache, fatigue and facial pain. He was prescribed 10 days course of Augmentin by his PCP. States he has finished his antibiotic but has not noticed any improvement. He states he took this for a total of 8 days. Does feel similar to previous sinus infection. No other specific treatments tried. Denies fever, chills, cough, or epistaxis. Denies n/v/d, SOB, wheezing. No ear pain or pressure. No sore throat or shortness of breath.    Past Medical History:  Diagnosis Date   Diverticulitis    Hyperlipidemia    Hypertension    PONV (postoperative nausea and vomiting)    when he was 66 years old     Social History   Tobacco Use   Smoking status: Never   Smokeless tobacco: Never  Substance Use Topics   Alcohol use: Never    Alcohol/week: 8.0 standard drinks of alcohol    Types: 1 Glasses of wine, 7 Standard drinks or equivalent per week    Comment: had been daily- quit 2019   Drug use: No    Past Surgical History:  Procedure Laterality Date    bone spur removal     2016- foot   ARTHROTOMY Right 11/15/2015   Procedure: RIGHT ANKLE ARTHROTOMY ;  Surgeon: Melrose Nakayama, MD;  Location: [facility];  Service: Orthopedics;  Laterality: Right;   balloon septoplasty     COLONOSCOPY  2011   ELECTROMAGNETIC NAVIGATION BROCHOSCOPY  12/20/2016   FOREIGN BODY REMOVAL Right 11/15/2015   Procedure: RIGHT ANKLE LOOSE BODY REMOVAL;  Surgeon: Melrose Nakayama, MD;  Location: [facility];  Service: Orthopedics;  Laterality: Right;   HERNIA REPAIR     66 years old   LUNG BIOPSY Right 03/07/2017   Procedure: RIGHT LUNG BIOPSY;   Surgeon: Melrose Nakayama, MD;  Location: [facility];  Service: Thoracic;  Laterality: Right;   LYMPH NODE DISSECTION Right 03/07/2017   Procedure: LYMPH NODE DISSECTION, right lung;  Surgeon: Melrose Nakayama, MD;  Location: [facility];  Service: Thoracic;  Laterality: Right;   SEGMENTECOMY Right 03/07/2017   Procedure: RIGHT LOWER LOBE SUPERIOR SEGMENTECTOMY;  Surgeon: Melrose Nakayama, MD;  Location: [facility];  Service: Thoracic;  Laterality: Right;   VIDEO ASSISTED THORACOSCOPY (VATS)/WEDGE RESECTION Right 03/07/2017   Procedure: RIGHT LUNG VIDEO ASSISTED THORACOSCOPY (VATS)/WEDGE RESECTION;  Surgeon: Melrose Nakayama, MD;  [facility];  Service: Thoracic;  Laterality: Right;    Family History  Problem Relation Age of Onset   Hypertension Mother        uncontrolled   Stroke Mother        87   Other Mother        died- fell after stroke and broke ribs- "lungs filled with fluid"   Other Father        unknown cause of death age 5   Rheum arthritis Father    Other Brother        some step siblingsonly    Allergies  Allergen Reactions   Levaquin [Levofloxacin] Other (See Comments)    MYALGIAS.Marland KitchenMarland KitchenNOTED AFTER TREATMENT 10/2016  Current Medications:   Current Outpatient Medications:    Bromelains 500 MG TABS, Take 1 tablet by mouth daily. , Disp: , Rfl:    clobetasol (TEMOVATE) 0.05 % external solution, Apply 1 application topically 2 (two) times daily. 7 days maximum. For scalp (Patient taking differently: Apply 1 application  topically daily as needed. As needed.), Disp: 50 mL, Rfl: 2   diltiazem (CARDIZEM CD) 120 MG 24 hr capsule, Take 1 capsule (120 mg total) by mouth daily., Disp: 90 capsule, Rfl: 3   doxycycline (VIBRA-TABS) 100 MG tablet, Take 1 tablet (100 mg total) by mouth 2 (two) times daily., Disp: 14 tablet, Rfl: 0   halobetasol (ULTRAVATE) 0.05 % cream, Apply topically 2 (two) times daily. For eczema. 7 days maximum (Patient taking differently: Apply topically as  needed. PRN), Disp: 50 g, Rfl: 2   lisinopril (ZESTRIL) 20 MG tablet, Take 1 tablet (20 mg total) by mouth daily., Disp: 90 tablet, Rfl: 3   mometasone (ELOCON) 0.1 % cream, Apply 1 application. topically daily., Disp: , Rfl:    rosuvastatin (CRESTOR) 10 MG tablet, Take 1 tablet (10 mg total) by mouth daily., Disp: 90 tablet, Rfl: 3   sildenafil (REVATIO) 20 MG tablet, Take 1-5 tablets as needed once every 48 hours for erectile dysfunction, Disp: 25 tablet, Rfl: 3   VITAMIN D PO, Take by mouth., Disp: , Rfl:    Review of Systems:   ROS Negative unless otherwise specified per HPI.   Vitals:   Vitals:   06/13/22 1353  BP: 110/66  Pulse: 81  Temp: 97.6 F (36.4 C)  TempSrc: Temporal  SpO2: 96%  Weight: 235 lb 4 oz (106.7 kg)  Height: '6\' 2"'$  (1.88 m)     Body mass index is 30.2 kg/m.  Physical Exam:   Physical Exam Vitals and nursing note reviewed.  Constitutional:      General: He is not in acute distress.    Appearance: He is well-developed. He is not ill-appearing or toxic-appearing.  HENT:     Head: Normocephalic and atraumatic.     Right Ear: Tympanic membrane, ear canal and external ear normal. Tympanic membrane is not erythematous, retracted or bulging.     Left Ear: Tympanic membrane, ear canal and external ear normal. Tympanic membrane is not erythematous, retracted or bulging.     Nose:     Right Sinus: Frontal sinus tenderness present. No maxillary sinus tenderness.     Left Sinus: Frontal sinus tenderness present. No maxillary sinus tenderness.     Mouth/Throat:     Pharynx: Uvula midline. No posterior oropharyngeal erythema.  Eyes:     General: Lids are normal.     Conjunctiva/sclera: Conjunctivae normal.  Neck:     Trachea: Trachea normal.  Cardiovascular:     Rate and Rhythm: Normal rate and regular rhythm.     Pulses: Normal pulses.     Heart sounds: Normal heart sounds, S1 normal and S2 normal.  Pulmonary:     Effort: Pulmonary effort is normal.      Breath sounds: Normal breath sounds. No decreased breath sounds, wheezing, rhonchi or rales.  Lymphadenopathy:     Cervical: No cervical adenopathy.  Skin:    General: Skin is warm and dry.  Neurological:     Mental Status: He is alert.     GCS: GCS eye subscore is 4. GCS verbal subscore is 5. GCS motor subscore is 6.  Psychiatric:        Speech: Speech normal.  Behavior: Behavior normal. Behavior is cooperative.     Assessment and Plan:   Chronic pansinusitis No red flags on exam.  Will initiate oral doxycycline for sinusitis per orders. Discussed taking medications as prescribed. We briefly discussed prednisone but he would like to reserve this as last resort. Reviewed return precautions including worsening fever, SOB, worsening cough or other concerns. Push fluids and rest. I recommend that patient follow-up if symptoms worsen or persist despite treatment x 7-10 days, sooner if needed.   I,Savera Zaman,acting as a Education administrator for [employer], PA.,have documented all relevant documentation on the behalf of Inda Coke, PA,as directed by  Inda Coke, PA while in the presence of Inda Coke, [place].   I, Inda Coke, [place], have reviewed all documentation for this visit. The documentation on 06/13/22 for the exam, diagnosis, procedures, and orders are all accurate and complete.   Inda Coke, PA-C

## 2022-06-13 NOTE — Patient Instructions (Signed)
It was great to see you!  We are going to trial a different antibiotic to try to knock out this infection.  Start doxycycline   Push fluids and get plenty of rest. Please return if you are not improving as expected, or if you have high fevers (>101.5) or difficulty swallowing or worsening productive cough.  Call clinic with questions.  I hope you start feeling better soon!

## 2022-09-17 ENCOUNTER — Encounter: Payer: Self-pay | Admitting: *Deleted

## 2022-11-20 ENCOUNTER — Encounter: Payer: Self-pay | Admitting: Family Medicine

## 2022-11-23 ENCOUNTER — Encounter: Payer: Self-pay | Admitting: Family Medicine

## 2022-11-23 ENCOUNTER — Ambulatory Visit (INDEPENDENT_AMBULATORY_CARE_PROVIDER_SITE_OTHER): Payer: Medicare Other | Admitting: Family Medicine

## 2022-11-23 VITALS — BP 132/78 | HR 75 | Temp 98.0°F | Ht 74.0 in | Wt 236.2 lb

## 2022-11-23 DIAGNOSIS — J329 Chronic sinusitis, unspecified: Secondary | ICD-10-CM | POA: Diagnosis not present

## 2022-11-23 DIAGNOSIS — B9689 Other specified bacterial agents as the cause of diseases classified elsewhere: Secondary | ICD-10-CM | POA: Diagnosis not present

## 2022-11-23 DIAGNOSIS — I10 Essential (primary) hypertension: Secondary | ICD-10-CM

## 2022-11-23 MED ORDER — DOXYCYCLINE HYCLATE 100 MG PO TABS: 100.0000 mg | ORAL_TABLET | Freq: Two times a day (BID) | ORAL | 0 refills | Status: AC

## 2022-11-23 NOTE — Patient Instructions (Signed)
Bacterial sinusitis based on worsening symptoms over last 3 weeks- trial doxycycline as you have had better success lately with this over augmentin. If fail to improve please follow up with [place] or certainly sooner if worsens

## 2022-11-23 NOTE — Progress Notes (Signed)
Phone [phone] In person visit   Subjective:   Ricardo Garcia is a 66 y.o. year old very pleasant male patient who presents for/with See problem oriented charting Chief Complaint  Patient presents with   Sinus Problem    Pt states he has always had sinus infection, pt states he KNOWS its an sinus infection. Pt states his gf was diagnosed with flu A on Tuesday or Wednesday and he was with her Sunday night. Pt has taken 2 covid test, both neg.     Past Medical History-  Patient Active Problem List   Diagnosis Date Noted   ILD (interstitial lung disease) ([place]) 03/07/2017    Priority: High   Hemoptysis 12/11/2016    Priority: High    PULMONARY INFILTRATES...BILATERAL LOWER LOBES 12/11/2016    Priority: High   Lung nodules 12/11/2016    Priority: High   PVC's (premature ventricular contractions) 07/06/2019    Priority: Medium    Eczema 04/15/2017    Priority: Medium    Sinusitis, chronic 07/28/2013    Priority: Medium    Hypertension 01/26/2012    Priority: Medium    Hyperlipidemia 01/26/2012    Priority: Medium     Medications- reviewed and updated Current Outpatient Medications  Medication Sig Dispense Refill   Bromelains 500 MG TABS Take 1 tablet by mouth daily.      clobetasol (TEMOVATE) 0.05 % external solution Apply 1 application topically 2 (two) times daily. 7 days maximum. For scalp (Patient taking differently: Apply 1 application  topically daily as needed. As needed.) 50 mL 2   diltiazem (CARDIZEM CD) 120 MG 24 hr capsule Take 1 capsule (120 mg total) by mouth daily. 90 capsule 3   doxycycline (VIBRA-TABS) 100 MG tablet Take 1 tablet (100 mg total) by mouth 2 (two) times daily for 10 days. 20 tablet 0   halobetasol (ULTRAVATE) 0.05 % cream Apply topically 2 (two) times daily. For eczema. 7 days maximum (Patient taking differently: Apply topically as needed. PRN) 50 g 2   lisinopril (ZESTRIL) 20 MG tablet Take 1 tablet (20 mg total) by mouth daily. 90 tablet 3    mometasone (ELOCON) 0.1 % cream Apply 1 application. topically daily.     rosuvastatin (CRESTOR) 10 MG tablet Take 1 tablet (10 mg total) by mouth daily. 90 tablet 3   sildenafil (REVATIO) 20 MG tablet Take 1-5 tablets as needed once every 48 hours for erectile dysfunction 25 tablet 3   VITAMIN D PO Take by mouth.     No current facility-administered medications for this visit.     Objective:  BP 132/78   Pulse 75   Temp 98 F (36.7 C) (Temporal)   Ht '6\' 2"'$  (1.88 m)   Wt 236 lb 3.2 oz (107.1 kg)   SpO2 96%   BMI 30.33 kg/m  Gen: NAD, resting comfortably No sinus tenderness.  Nasal turbinates erythematous with yellow discharge-some dried blood in the right nostril.  Pharynx with mild drainage posteriorly CV: RRR no murmurs rubs or gallops Lungs: CTAB no crackles, wheeze, rhonchi  Ext: no edema Skin: warm, dry     Assessment and Plan   # Sinus congestion S: Patient with history of recurrent sinus infections and deals with chronic low-level irritation on a pretty regular basis. Current symptoms started 3 weeks ago. Sinus pressure. Sinus pressure/congestion. Yellow nasal discharge and sputum. Has some blood mixed in from sinuses. No fever. No shortness of breath - worked out today without significant difficulty other than  congestion/coughing up. Some right upper dental sensitivity -has tested negative for covid twice.  -had flu shot and RSV shot last saturday - Girlfriend was diagnosed with influenza A on Tuesday or Wednesday and he was with her Sunday night.  A/P: Bacterial sinusitis based on worsening symptoms over last 3 weeks- trial doxycycline as you have had better success lately with this over augmentin. If fail to improve please follow up with [place] or certainly sooner if worsens   #hypertension S: medication: lisinopril 20 mg Home readings #s: 120s/70s BP Readings from Last 3 Encounters:  11/23/22 132/78  06/13/22 110/66  01/31/22 110/80  A/P: Controlled. Continue current  medications.   Recommended follow up: See above Future Appointments  Date Time Provider [place]  02/04/2023  8:00 AM Marin Olp, MD [facility]  05/16/2023 11:30 AM [facility] HEALTH COACH [facility]   Lab/Order associations:   ICD-10-CM   1. Bacterial sinusitis  J32.9    B96.89     2. Primary hypertension  I10       Meds ordered this encounter  Medications   doxycycline (VIBRA-TABS) 100 MG tablet    Sig: Take 1 tablet (100 mg total) by mouth 2 (two) times daily for 10 days.    Dispense:  20 tablet    Refill:  0    Return precautions advised.  Garret Reddish, MD

## 2022-11-29 ENCOUNTER — Encounter: Payer: Self-pay | Admitting: Family Medicine

## 2022-12-14 MED ORDER — AMOXICILLIN-POT CLAVULANATE 875-125 MG PO TABS: 1.0000 | ORAL_TABLET | Freq: Two times a day (BID) | ORAL | 0 refills | Status: DC

## 2023-01-20 ENCOUNTER — Other Ambulatory Visit: Payer: Self-pay | Admitting: Family Medicine

## 2023-01-24 ENCOUNTER — Encounter: Payer: Self-pay | Admitting: Family Medicine

## 2023-02-04 ENCOUNTER — Encounter: Payer: Self-pay | Admitting: Family Medicine

## 2023-02-04 ENCOUNTER — Ambulatory Visit (INDEPENDENT_AMBULATORY_CARE_PROVIDER_SITE_OTHER): Payer: Medicare Other | Admitting: Family Medicine

## 2023-02-04 VITALS — BP 110/70 | HR 71 | Temp 97.0°F | Ht 74.0 in | Wt 238.6 lb

## 2023-02-04 DIAGNOSIS — R739 Hyperglycemia, unspecified: Secondary | ICD-10-CM

## 2023-02-04 DIAGNOSIS — R351 Nocturia: Secondary | ICD-10-CM | POA: Diagnosis not present

## 2023-02-04 DIAGNOSIS — E785 Hyperlipidemia, unspecified: Secondary | ICD-10-CM | POA: Diagnosis not present

## 2023-02-04 DIAGNOSIS — J849 Interstitial pulmonary disease, unspecified: Secondary | ICD-10-CM

## 2023-02-04 DIAGNOSIS — I10 Essential (primary) hypertension: Secondary | ICD-10-CM

## 2023-02-04 LAB — LIPID PANEL
Cholesterol: 194 mg/dL (ref 0–200)
HDL: 60.3 mg/dL (ref >39.00)
LDL Cholesterol: 111 mg/dL — ABNORMAL HIGH (ref 0–99)
NonHDL: 133.28
Total CHOL/HDL Ratio: 3
Triglycerides: 110 mg/dL (ref 0.0–149.0)
VLDL: 22 mg/dL (ref 0.0–40.0)

## 2023-02-04 LAB — COMPREHENSIVE METABOLIC PANEL
ALT: 15 U/L (ref 0–53)
AST: 15 U/L (ref 0–37)
Albumin: 4.3 g/dL (ref 3.5–5.2)
Alkaline Phosphatase: 44 U/L (ref 39–117)
BUN: 17 mg/dL (ref 6–23)
CO2: 27 mEq/L (ref 19–32)
Calcium: 10 mg/dL (ref 8.4–10.5)
Chloride: 101 mEq/L (ref 96–112)
Creatinine, Ser: 1.14 mg/dL (ref 0.40–1.50)
GFR: 66.57 mL/min (ref >60.00)
Glucose, Bld: 113 mg/dL — ABNORMAL HIGH (ref 70–99)
Potassium: 4.5 mEq/L (ref 3.5–5.1)
Sodium: 136 mEq/L (ref 135–145)
Total Bilirubin: 0.6 mg/dL (ref 0.2–1.2)
Total Protein: 6.7 g/dL (ref 6.0–8.3)

## 2023-02-04 LAB — CBC WITH DIFFERENTIAL/PLATELET
Basophils Absolute: 0.1 10*3/uL (ref 0.0–0.1)
Basophils Relative: 1 % (ref 0.0–3.0)
Eosinophils Absolute: 0.4 10*3/uL (ref 0.0–0.7)
Eosinophils Relative: 5.6 % — ABNORMAL HIGH (ref 0.0–5.0)
HCT: 44.9 % (ref 39.0–52.0)
Hemoglobin: 15.1 g/dL (ref 13.0–17.0)
Lymphocytes Relative: 13.6 % (ref 12.0–46.0)
Lymphs Abs: 0.9 10*3/uL (ref 0.7–4.0)
MCHC: 33.6 g/dL (ref 30.0–36.0)
MCV: 93 fl (ref 78.0–100.0)
Monocytes Absolute: 0.6 10*3/uL (ref 0.1–1.0)
Monocytes Relative: 8.5 % (ref 3.0–12.0)
Neutro Abs: 4.8 10*3/uL (ref 1.4–7.7)
Neutrophils Relative %: 71.3 % (ref 43.0–77.0)
Platelets: 246 10*3/uL (ref 150.0–400.0)
RBC: 4.82 Mil/uL (ref 4.22–5.81)
RDW: 13.8 % (ref 11.5–15.5)
WBC: 6.7 10*3/uL (ref 4.0–10.5)

## 2023-02-04 LAB — PSA: PSA: 1.07 ng/mL (ref 0.10–4.00)

## 2023-02-04 LAB — HEMOGLOBIN A1C: Hgb A1c MFr Bld: 5.8 % (ref 4.6–6.5)

## 2023-02-04 MED ORDER — SILDENAFIL CITRATE 100 MG PO TABS: 100.0000 mg | ORAL_TABLET | Freq: Every day | ORAL | 5 refills | Status: AC | PRN

## 2023-02-04 NOTE — Progress Notes (Signed)
Phone [phone] In person visit   Subjective:   Ricardo Garcia is a 67 y.o. year old very pleasant male patient who presents for/with See problem oriented charting Chief Complaint  Patient presents with   Annual Exam    (NO MASK) fasting.   Hypertension    Past Medical History-  Patient Active Problem List   Diagnosis Date Noted   ILD (interstitial lung disease) ([place]) 03/07/2017    Priority: High   Hemoptysis 12/11/2016    Priority: High    PULMONARY INFILTRATES...BILATERAL LOWER LOBES 12/11/2016    Priority: High   Lung nodules 12/11/2016    Priority: High   PVC's (premature ventricular contractions) 07/06/2019    Priority: Medium    Eczema 04/15/2017    Priority: Medium    Sinusitis, chronic 07/28/2013    Priority: Medium    Hypertension 01/26/2012    Priority: Medium    Hyperlipidemia 01/26/2012    Priority: Medium     Medications- reviewed and updated Current Outpatient Medications  Medication Sig Dispense Refill   Bromelains 500 MG TABS Take 1 tablet by mouth daily.      clobetasol (TEMOVATE) 0.05 % external solution Apply 1 application topically 2 (two) times daily. 7 days maximum. For scalp (Patient taking differently: Apply 1 application  topically daily as needed. As needed.) 50 mL 2   diltiazem (CARDIZEM CD) 120 MG 24 hr capsule Take 1 capsule (120 mg total) by mouth daily. 90 capsule 3   halobetasol (ULTRAVATE) 0.05 % cream Apply topically 2 (two) times daily. For eczema. 7 days maximum (Patient taking differently: Apply topically as needed. PRN) 50 g 2   lisinopril (ZESTRIL) 20 MG tablet TAKE 1 TABLET DAILY 90 tablet 3   mometasone (ELOCON) 0.1 % cream Apply 1 application. topically daily.     rosuvastatin (CRESTOR) 10 MG tablet TAKE 1 TABLET DAILY 90 tablet 3   sildenafil (VIAGRA) 100 MG tablet Take 1 tablet (100 mg total) by mouth daily as needed for erectile dysfunction. 30 tablet 5   VITAMIN D PO Take by mouth.     No current facility-administered  medications for this visit.     Objective:  BP 110/70   Pulse 71   Temp (!) 97 F (36.1 C)   Ht 6' 2"$  (1.88 m)   Wt 238 lb 9.6 oz (108.2 kg)   SpO2 94%   BMI 30.63 kg/m  Gen: NAD, resting comfortably CV: RRR no murmurs rubs or gallops Lungs: CTAB no crackles, wheeze, rhonchi Abdomen: soft/nontender/nondistended/normal bowel sounds. No rebound or guarding.  Diastasis recti and small periumbilical hernia (gave warning signs and wants to hol doff on surgical consult) Ext: no edema Skin: warm, dry Neuro: grossly normal, moves all extremities     Assessment and Plan   # Health maintenance counseling for annual follow-up - Wants to hold off on COVID shot (may still do), otherwise up-to-date  -Low risk prior PSA trend but will trend PSA with nocturia  Lab Results  Component Value Date   PSA 0.95 01/31/2022   PSA 0.83 01/27/2021   PSA 0.80 10/27/2019  -Colon cancer screening with Dr. Benson [place] in the past-Cologuard 05/19/2020 and will be due in May - [place] [facility] at least annually - Declines STD screening- only active with girlfriend    #hypertension/PVCs S: medication: Lisinopril 20 mg, diltiazem 120 mg ER -PACs/PVCs have been controlled on diltiazem in general-has seen [facility] in the past- they come and go and has not seen cardiology  lately BP Readings from Last 3 Encounters:  02/04/23 110/70  11/23/22 132/78  06/13/22 110/66  A/P: stable- continue current medicines    #hyperlipidemia S: Medication:Rosuvastatin 10 mg-myalgias in the past-has not increased dose Lab Results  Component Value Date   CHOL 186 01/31/2022   HDL 70.20 01/31/2022   LDLCALC 105 (H) 01/31/2022   TRIG 56.0 01/31/2022   CHOLHDL 3 01/31/2022   A/P: lipids mildly high but hesitant to increase dose with myalgia history and preDM- likely continue current medications unless significant worsening   # Hyperglycemia/insulin resistance/prediabetes S:  Medication: none Exercise and  diet- weight stable from last year. Feels could lose weight but enjoys eating. Still in gym 3-5 days a week Lab Results  Component Value Date   HGBA1C 5.7 01/31/2022   HGBA1C 5.7 01/27/2021   A/P: hopefully stable or improved- update a1c today. Continue current meds for now  # Eczema-intermittent flareups-can refill steroid creams as needed but next 10 days - still with some issues- tolerating essentially. Dermatology has never mentioned psoriasis and has had biopsies in past. Sun or tanning bed improves  # ED-low-dose sildenafil sent in in the past but has not tried- wants refill - advised with 134m only trial half tablet of the sildenafil   # Sciatica- started 3-4 works ago and working with cRestaurant manager, fast food Worse in the morning when wakes up and once sits up 15 minutes gets better. He wants to monitor- can reach out or see sports medicine or ortho if not improving within 6 weeks.    # Chronic sinusitis S: In December was treated for worsening sinusitis with doxycycline but had worsening tinnitus which resolved off medication-we switched him back to Augmentin and sent in 10 days but only tolerated 8 days due to GI issues. -With chronicity he is also wonder about fungal infection we mentioned seeing ENT to discuss possible culture -started breathing a pot of steam each morning and night and has found this very helpful- then gets large volume out A/P: his ENT is in [place] but may transition to [place] but since he is doing better wants to hold off.  -continue home remedy that is working well- if he misses notices worsening   # Obesity-Per BMI but does have good muscle mass-overweight like a more accurate S: Weight stable from the last year Wt Readings from Last 3 Encounters:  02/04/23 238 lb 9.6 oz (108.2 kg)  11/23/22 236 lb 3.2 oz (107.1 kg)  06/13/22 235 lb 4 oz (106.7 kg)  A/P: has actually gained some muscle mass he reports which is encouraging with stable weight- continue with gym  and can push on weight loss if needed for prediabetes  - may do something with gf   #[facility] and seemed to [facility]- no recurrence- will monitor  Recommended follow up: Return in about 6 months (around 08/05/2023) for followup or sooner if needed.Schedule b4 you leave. Future Appointments  Date Time Provider [place] 05/16/2023 11:30 AM [facility] HEALTH COACH [facility]   Lab/Order associations: fasting   ICD-10-CM   1. Primary hypertension  I10 CBC with Differential/Platelet    Comprehensive metabolic panel    Lipid panel    2. Hyperlipidemia, unspecified hyperlipidemia type  E78.5 CBC with Differential/Platelet    Comprehensive metabolic panel    Lipid panel    3. Nocturia  R35.1 PSA    4. Hyperglycemia  R73.9 Hemoglobin A1c    5. ILD (interstitial lung disease) (  [place]) Chronic J84.9       Meds ordered this encounter  Medications   sildenafil (VIAGRA) 100 MG tablet    Sig: Take 1 tablet (100 mg total) by mouth daily as needed for erectile dysfunction.    Dispense:  30 tablet    Refill:  5    Plans to use goodrx    Return precautions advised.  Garret Reddish, MD

## 2023-02-04 NOTE — Patient Instructions (Addendum)
Let us know after may 27th and we can order cologuard  advised with 177m only trial half tablet of the sildenafil at first- can ue full tablet if no side effects but needs more benefit  Please stop by lab before you go If you have mychart- we will send your results within 3 business days of [place].  If you do not have mychart- we will call you about results within 5 business days of [place] them.  *please also note that you will see labs on mychart as soon as they post. I will later go in and write notes on them- will say "notes from Dr. HYong Channel   Recommended follow up: Return in about 6 months (around 08/05/2023) for followup or sooner if needed.Schedule b4 you leave. But definitely a year at the latest

## 2023-02-10 ENCOUNTER — Other Ambulatory Visit: Payer: Self-pay | Admitting: Family Medicine

## 2023-04-25 ENCOUNTER — Telehealth: Payer: Self-pay | Admitting: Family Medicine

## 2023-04-25 NOTE — Telephone Encounter (Signed)
Contacted Ricardo Garcia to schedule their annual wellness visit. Appointment made for 05/16/2023.  Appointment time change.  [name] TEAM Direct Dial [phone]

## 2023-05-16 ENCOUNTER — Ambulatory Visit (INDEPENDENT_AMBULATORY_CARE_PROVIDER_SITE_OTHER): Payer: Medicare Other

## 2023-05-16 VITALS — Wt 238.0 lb

## 2023-05-16 DIAGNOSIS — Z0000 Encounter for general adult medical examination without abnormal findings: Secondary | ICD-10-CM | POA: Diagnosis not present

## 2023-05-16 NOTE — Progress Notes (Signed)
I connected with  Ricardo Garcia on 05/16/23 by a audio enabled telemedicine application and verified that I am speaking with the correct person using two identifiers.  Patient Location: Home  Provider Location: Office/Clinic  I discussed the limitations of evaluation and management by telemedicine. The patient expressed understanding and agreed to proceed.    Patient Medicare AWV questionnaire was completed by the patient on 05/14/23 I have confirmed that all information answered by patient is correct and no changes since this date.      Subjective:   Ricardo Garcia is a 67 y.o. male who presents for Medicare Annual/Subsequent preventive examination.  Review of Systems     Cardiac Risk Factors include: advanced age (>43men, >5 women);dyslipidemia;hypertension;male gender;obesity (BMI >30kg/m2)     Objective:    Today's Vitals   05/16/23 1201  Weight: 238 lb (108 kg)   Body mass index is 30.56 kg/m.     05/16/2023   12:06 PM 05/08/2022   11:09 AM 03/09/2017    5:00 PM 03/05/2017   12:40 PM 11/14/2015    8:22 AM  Advanced Directives  Does Patient Have a Medical Advance Directive? Yes Yes Yes Yes Yes  Type of Estate agent of [place];Living will Healthcare Power of [place];Living will Healthcare Power of [place];Living will Healthcare Power of [place];Living will   Does patient want to make changes to medical advance directive?   No - Patient declined    Copy of Healthcare Power of Attorney in Chart? No - copy requested No - copy requested No - copy requested No - copy requested No - copy requested    Current Medications (verified) Outpatient Encounter Medications as of 05/16/2023  Medication Sig   Bromelains 500 MG TABS Take 1 tablet by mouth daily.    clobetasol (TEMOVATE) 0.05 % external solution Apply 1 application topically 2 (two) times daily. 7 days maximum. For scalp (Patient taking differently: Apply 1 application  topically daily as needed.  As needed.)   diltiazem (CARDIZEM CD) 120 MG 24 hr capsule TAKE 1 CAPSULE BY MOUTH EVERY DAY   halobetasol (ULTRAVATE) 0.05 % cream Apply topically 2 (two) times daily. For eczema. 7 days maximum (Patient taking differently: Apply topically as needed. PRN)   lisinopril (ZESTRIL) 20 MG tablet TAKE 1 TABLET DAILY   mometasone (ELOCON) 0.1 % cream Apply 1 application. topically daily.   rosuvastatin (CRESTOR) 10 MG tablet TAKE 1 TABLET DAILY   sildenafil (VIAGRA) 100 MG tablet Take 1 tablet (100 mg total) by mouth daily as needed for erectile dysfunction.   VITAMIN D PO Take by mouth.   No facility-administered encounter medications on file as of 05/16/2023.    Allergies (verified) Levaquin [levofloxacin] and Doxycycline   History: Past Medical History:  Diagnosis Date   Diverticulitis    Hyperlipidemia    Hypertension    PONV (postoperative nausea and vomiting)    when he was 67 years old   Past Surgical History:  Procedure Laterality Date    bone spur removal     2016- foot   ARTHROTOMY Right 11/15/2015   Procedure: RIGHT ANKLE ARTHROTOMY ;  Surgeon: Marcene Corning, MD;  Location: [facility] OR;  Service: Orthopedics;  Laterality: Right;   balloon septoplasty     COLONOSCOPY  2011   ELECTROMAGNETIC NAVIGATION BROCHOSCOPY  12/20/2016   FOREIGN BODY REMOVAL Right 11/15/2015   Procedure: RIGHT ANKLE LOOSE BODY REMOVAL;  Surgeon: Marcene Corning, MD;  Location: [facility] OR;  Service: Orthopedics;  Laterality: Right;  HERNIA REPAIR     67 years old   LUNG BIOPSY Right 03/07/2017   Procedure: RIGHT LUNG BIOPSY;  Surgeon: Loreli Slot, MD;  Location: [facility] OR;  Service: Thoracic;  Laterality: Right;   LYMPH NODE DISSECTION Right 03/07/2017   Procedure: LYMPH NODE DISSECTION, right lung;  Surgeon: Loreli Slot, MD;  Location: [facility] OR;  Service: Thoracic;  Laterality: Right;   SEGMENTECOMY Right 03/07/2017   Procedure: RIGHT LOWER LOBE SUPERIOR SEGMENTECTOMY;  Surgeon: Loreli Slot,  MD;  Location: [facility] OR;  Service: Thoracic;  Laterality: Right;   VIDEO ASSISTED THORACOSCOPY (VATS)/WEDGE RESECTION Right 03/07/2017   Procedure: RIGHT LUNG VIDEO ASSISTED THORACOSCOPY (VATS)/WEDGE RESECTION;  Surgeon: Loreli Slot, MD;  Location: [facility] OR;  Service: Thoracic;  Laterality: Right;   Family History  Problem Relation Age of Onset   Hypertension Mother        uncontrolled   Stroke Mother        51   Other Mother        died- fell after stroke and broke ribs- "lungs filled with fluid"   Other Father        unknown cause of death age 42   Rheum arthritis Father    Other Brother        some step siblingsonly   Social History   Socioeconomic History   Marital status: Divorced    Spouse name: Not on file   Number of children: Not on file   Years of education: Not on file   Highest education level: Not on file  Occupational History   Not on file  Tobacco Use   Smoking status: Never   Smokeless tobacco: Never  Substance and Sexual Activity   Alcohol use: Yes    Alcohol/week: 8.0 standard drinks of alcohol    Types: 1 Glasses of wine, 7 Standard drinks or equivalent per week    Comment: had been daily- quit 2019   Drug use: No   Sexual activity: Yes    Partners: Female  Other Topics Concern   Not on file  Social History Narrative   Divorced. Lives alone. 2 children- son 84 at [place], daughter 25- [employer].       [facility]    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: gym 4-5 days a week, guitar, time with friends and family   Social Determinants of Health   Financial Resource Strain: Low Risk  (05/14/2023)   Overall Financial Resource Strain (CARDIA)    Difficulty of Paying Living Expenses: Not hard at all  Food Insecurity: No Food Insecurity (05/14/2023)   Hunger Vital Sign    Worried About Running Out of Food in the Last Year: Never true    Ran Out of Food in the Last Year: Never true  Transportation Needs: No  Transportation Needs (05/14/2023)   PRAPARE - Administrator, Civil Service (Medical): No    Lack of Transportation (Non-Medical): No  Physical Activity: Sufficiently Active (05/14/2023)   Exercise Vital Sign    Days of Exercise per Week: 5 days    Minutes of Exercise per Session: 60 min  Stress: No Stress Concern Present (05/14/2023)   [employer] of Occupational Health - Occupational Stress Questionnaire    Feeling of Stress : Not at all  Social Connections: Moderately Isolated (05/14/2023)   Social Connection and Isolation Panel [NHANES]    Frequency of Communication with Friends  and Family: More than three times a week    Frequency of Social Gatherings with Friends and Family: Twice a week    Attends Religious Services: Never    Database administrator or Organizations: Yes    Attends Engineer, structural: More than 4 times per year    Marital Status: Divorced    Tobacco Counseling Counseling given: Not Answered   Clinical Intake:  Pre-visit preparation completed: Yes  Pain : No/denies pain     BMI - recorded: 30.56 Nutritional Status: BMI > 30  Obese Diabetes: No  How often do you need to have someone help you when you read instructions, pamphlets, or other written materials from your doctor or pharmacy?: 1 - Never  Diabetic?no  Interpreter Needed?: No  Information entered by :: Lanier Ensign, LPN   Activities of Daily Living    05/14/2023   10:14 AM 05/12/2023    8:54 AM  In your present state of health, do you have any difficulty performing the following activities:  Hearing? 0 0  Vision? 0 0  Difficulty concentrating or making decisions? 0 0  Walking or climbing stairs? 0 0  Dressing or bathing? 0 0  Doing errands, shopping? 0 0  Preparing Food and eating ? N N  Using the Toilet? N N  In the past six months, have you accidently leaked urine? N N  Do you have problems with loss of bowel control? N N  Managing your Medications? N  N  Managing your Finances? N N  Housekeeping or managing your Housekeeping? N N    Patient Care Team: Shelva Majestic, MD as PCP - General (Family Medicine) Loreli Slot, MD as Consulting Physician (Cardiothoracic Surgery)  Indicate any recent Medical Services you may have received from other than [facility] providers in the past year (date may be approximate).     Assessment:   This is a routine wellness examination for Jr.  Hearing/Vision screen Hearing Screening - Comments:: Pt denies any hearing issues  Vision Screening - Comments:: Pt follows up with [facility] for [facility] eye exams   Dietary issues and exercise activities discussed: Current Exercise Habits: Home exercise routine, Type of exercise: Other - see comments;walking, Time (Minutes): 60, Frequency (Times/Week): 5, Weekly Exercise (Minutes/Week): 300   Goals Addressed             This Visit's Progress    Patient Stated       None at this time       Depression Screen    05/16/2023   12:05 PM 02/04/2023    8:13 AM 05/08/2022   11:08 AM 01/31/2022    8:07 AM 01/27/2021    9:19 AM 11/07/2020    3:51 PM 10/27/2019    2:08 PM  PHQ 2/9 Scores  PHQ - 2 Score 0 0 0 0 0 0 0  PHQ- 9 Score  1         Fall Risk    05/14/2023   10:14 AM 05/12/2023    8:54 AM 02/04/2023    8:13 AM 05/08/2022   11:10 AM 01/31/2022    8:07 AM  Fall Risk   Falls in the past year? 0 0 0 0 0  Number falls in past yr: 0 0 0 0 0  Injury with Fall? 0 0 0 0 0  Risk for fall due to : Impaired vision  No Fall Risks Impaired vision No Fall Risks  Follow up Falls prevention discussed  Falls evaluation completed Falls prevention discussed Falls evaluation completed    FALL RISK PREVENTION PERTAINING TO THE HOME:  Any stairs in or around the home? Yes  If so, are there any without handrails? No  Home free of loose throw rugs in walkways, pet beds, electrical cords, etc? Yes  Adequate lighting in your home to reduce risk of falls? Yes    ASSISTIVE DEVICES UTILIZED TO PREVENT FALLS:  Life alert? No  Use of a cane, walker or w/c? No  Grab bars in the bathroom? No  Shower chair or bench in shower? No  Elevated toilet seat or a handicapped toilet? No   TIMED UP AND GO:  Was the test performed? No .  Cognitive Function:        05/16/2023   12:46 PM 05/08/2022   11:12 AM  6CIT Screen  What Year? 0 points 0 points  What month? 0 points 0 points  What time? 0 points 0 points  Count back from 20 0 points 0 points  Months in reverse 0 points 0 points  Repeat phrase 0 points 0 points  Total Score 0 points 0 points    Immunizations Immunization History  Administered Date(s) Administered   Influenza Inj Mdck Quad Pf 10/29/2017   Influenza Split 10/16/2012, 09/23/2013, 12/24/2016   Influenza, Seasonal, Injecte, Preservative Fre 10/01/2015, 12/24/2016   Influenza,inj,Quad PF,6+ Mos 10/01/2015, 10/21/2018, 09/14/2019, 09/29/2020   Influenza-Unspecified 10/18/2014, 10/01/2015, 12/24/2016, 10/04/2021, 11/17/2022   PFIZER Comirnaty(Gray Top)Covid-19 Tri-Sucrose Vaccine 03/30/2021   PFIZER(Purple Top)SARS-COV-2 Vaccination 02/14/2020, 03/09/2020, 10/14/2020, 03/30/2021   Pneumococcal Conjugate-13 01/27/2021   Pneumococcal Polysaccharide-23 02/11/2017   RSV,unspecified 11/17/2022   Tdap 12/24/2010   Zoster Recombinat (Shingrix) 11/01/2017, 01/31/2018   Zoster, Live 12/24/2010     TDAP status: Up to date  Flu Vaccine status: Up to date  Pneumococcal vaccine status: Up to date  Covid-19 vaccine status: Completed vaccines  Qualifies for Shingles Vaccine? Yes   Zostavax completed Yes   Shingrix Completed?: Yes  Screening Tests Health Maintenance  Topic Date Due   COVID-19 Vaccine (6 - 2023-24 season) 08/24/2022   Fecal DNA (Cologuard)  05/20/2023   INFLUENZA VACCINE  07/25/2023   Medicare Annual Wellness (AWV)  05/15/2024   Pneumonia Vaccine 59+ Years old (3 of 3 - PPSV23 or PCV20) 01/27/2026   Hepatitis C  Screening  Completed   Zoster Vaccines- Shingrix  Completed   HPV VACCINES  Aged Out   DTaP/Tdap/Td  Discontinued   COLONOSCOPY (Pts 45-74yrs Insurance coverage will need to be confirmed)  Discontinued    Health Maintenance  Health Maintenance Due  Topic Date Due   COVID-19 Vaccine (6 - 2023-24 season) 08/24/2022    Colorectal cancer screening: Type of screening: Cologuard. Completed 05/19/20. Repeat every 3 years  Additional Screening:  Hepatitis C Screening:  Completed 07/19/16  Vision Screening: Recommended annual ophthalmology exams for early detection of glaucoma and other disorders of the eye. Is the patient up to date with their annual eye exam?  Yes  Who is the provider or what is the name of the office in which the patient attends annual eye exams? [facility] If pt is not established with a provider, would they like to be referred to a provider to establish care? No .   Dental Screening: Recommended annual dental exams for proper oral hygiene  Community Resource Referral / Chronic Care Management: CRR required this visit?  No   CCM required this visit?  No  Plan:     I have personally reviewed and noted the following in the patient's chart:   Medical and social history Use of alcohol, tobacco or illicit drugs  Current medications and supplements including opioid prescriptions. Patient is not currently taking opioid prescriptions. Functional ability and status Nutritional status Physical activity Advanced directives List of other physicians Hospitalizations, surgeries, and ER visits in previous 12 months Vitals Screenings to include cognitive, depression, and falls Referrals and appointments  In addition, I have reviewed and discussed with patient certain preventive protocols, quality metrics, and best practice recommendations. A written personalized care plan for preventive services as well as general preventive health recommendations were provided to  patient.     Marzella Schlein, LPN   [id number]/61/0960   Nurse Notes: none

## 2023-05-16 NOTE — Progress Notes (Signed)
I connected with  Ricardo Garcia on 05/16/23 by a audio enabled telemedicine application and verified that I am speaking with the correct person using two identifiers.  Patient Location: Home  Provider Location: Office/Clinic  I discussed the limitations of evaluation and management by telemedicine. The patient expressed understanding and agreed to proceed.    Patient Medicare AWV questionnaire was completed by the patient on 05/14/23 I have confirmed that all information answered by patient is correct and no changes since this date.      Subjective:   Ricardo Garcia is a 67 y.o. male who presents for Medicare Annual/Subsequent preventive examination.  Review of Systems           Objective:    Today's Vitals   05/16/23 1201  Weight: 238 lb (108 kg)   Body mass index is 30.56 kg/m.     05/16/2023   12:06 PM 05/08/2022   11:09 AM 03/09/2017    5:00 PM 03/05/2017   12:40 PM 11/14/2015    8:22 AM  Advanced Directives  Does Patient Have a Medical Advance Directive? Yes Yes Yes Yes Yes  Type of Estate agent of [place];Living will Healthcare Power of [place];Living will Healthcare Power of [place];Living will Healthcare Power of [place];Living will   Does patient want to make changes to medical advance directive?   No - Patient declined    Copy of Healthcare Power of Attorney in Chart? No - copy requested No - copy requested No - copy requested No - copy requested No - copy requested    Current Medications (verified) Outpatient Encounter Medications as of 05/16/2023  Medication Sig   Bromelains 500 MG TABS Take 1 tablet by mouth daily.    clobetasol (TEMOVATE) 0.05 % external solution Apply 1 application topically 2 (two) times daily. 7 days maximum. For scalp (Patient taking differently: Apply 1 application  topically daily as needed. As needed.)   diltiazem (CARDIZEM CD) 120 MG 24 hr capsule TAKE 1 CAPSULE BY MOUTH EVERY DAY   halobetasol (ULTRAVATE)  0.05 % cream Apply topically 2 (two) times daily. For eczema. 7 days maximum (Patient taking differently: Apply topically as needed. PRN)   lisinopril (ZESTRIL) 20 MG tablet TAKE 1 TABLET DAILY   mometasone (ELOCON) 0.1 % cream Apply 1 application. topically daily.   rosuvastatin (CRESTOR) 10 MG tablet TAKE 1 TABLET DAILY   sildenafil (VIAGRA) 100 MG tablet Take 1 tablet (100 mg total) by mouth daily as needed for erectile dysfunction.   VITAMIN D PO Take by mouth.   No facility-administered encounter medications on file as of 05/16/2023.    Allergies (verified) Levaquin [levofloxacin] and Doxycycline   History: Past Medical History:  Diagnosis Date   Diverticulitis    Hyperlipidemia    Hypertension    PONV (postoperative nausea and vomiting)    when he was 67 years old   Past Surgical History:  Procedure Laterality Date    bone spur removal     2016- foot   ARTHROTOMY Right 11/15/2015   Procedure: RIGHT ANKLE ARTHROTOMY ;  Surgeon: Marcene Corning, MD;  Location: [facility] OR;  Service: Orthopedics;  Laterality: Right;   balloon septoplasty     COLONOSCOPY  2011   ELECTROMAGNETIC NAVIGATION BROCHOSCOPY  12/20/2016   FOREIGN BODY REMOVAL Right 11/15/2015   Procedure: RIGHT ANKLE LOOSE BODY REMOVAL;  Surgeon: Marcene Corning, MD;  Location: [facility] OR;  Service: Orthopedics;  Laterality: Right;   HERNIA REPAIR     67 years  old   LUNG BIOPSY Right 03/07/2017   Procedure: RIGHT LUNG BIOPSY;  Surgeon: Loreli Slot, MD;  Location: [facility] OR;  Service: Thoracic;  Laterality: Right;   LYMPH NODE DISSECTION Right 03/07/2017   Procedure: LYMPH NODE DISSECTION, right lung;  Surgeon: Loreli Slot, MD;  Location: [facility] OR;  Service: Thoracic;  Laterality: Right;   SEGMENTECOMY Right 03/07/2017   Procedure: RIGHT LOWER LOBE SUPERIOR SEGMENTECTOMY;  Surgeon: Loreli Slot, MD;  Location: [facility] OR;  Service: Thoracic;  Laterality: Right;   VIDEO ASSISTED THORACOSCOPY (VATS)/WEDGE RESECTION Right  03/07/2017   Procedure: RIGHT LUNG VIDEO ASSISTED THORACOSCOPY (VATS)/WEDGE RESECTION;  Surgeon: Loreli Slot, MD;  Location: [facility] OR;  Service: Thoracic;  Laterality: Right;   Family History  Problem Relation Age of Onset   Hypertension Mother        uncontrolled   Stroke Mother        60   Other Mother        died- fell after stroke and broke ribs- "lungs filled with fluid"   Other Father        unknown cause of death age 26   Rheum arthritis Father    Other Brother        some step siblingsonly   Social History   Socioeconomic History   Marital status: Divorced    Spouse name: Not on file   Number of children: Not on file   Years of education: Not on file   Highest education level: Not on file  Occupational History   Not on file  Tobacco Use   Smoking status: Never   Smokeless tobacco: Never  Substance and Sexual Activity   Alcohol use: Yes    Alcohol/week: 8.0 standard drinks of alcohol    Types: 1 Glasses of wine, 7 Standard drinks or equivalent per week    Comment: had been daily- quit 2019   Drug use: No   Sexual activity: Yes    Partners: Female  Other Topics Concern   Not on file  Social History Narrative   Divorced. Lives alone. 2 children- son 61 at [place], daughter 55- [employer].       [facility]    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: gym 4-5 days a week, guitar, time with friends and family   Social Determinants of Health   Financial Resource Strain: Low Risk  (05/14/2023)   Overall Financial Resource Strain (CARDIA)    Difficulty of Paying Living Expenses: Not hard at all  Food Insecurity: No Food Insecurity (05/14/2023)   Hunger Vital Sign    Worried About Running Out of Food in the Last Year: Never true    Ran Out of Food in the Last Year: Never true  Transportation Needs: No Transportation Needs (05/14/2023)   PRAPARE - Administrator, Civil Service (Medical): No    Lack of  Transportation (Non-Medical): No  Physical Activity: Sufficiently Active (05/14/2023)   Exercise Vital Sign    Days of Exercise per Week: 5 days    Minutes of Exercise per Session: 60 min  Stress: No Stress Concern Present (05/14/2023)   [employer] of Occupational Health - Occupational Stress Questionnaire    Feeling of Stress : Not at all  Social Connections: Moderately Isolated (05/14/2023)   Social Connection and Isolation Panel [NHANES]    Frequency of Communication with Friends and Family: More than three times a week  Frequency of Social Gatherings with Friends and Family: Twice a week    Attends Religious Services: Never    Database administrator or Organizations: Yes    Attends Engineer, structural: More than 4 times per year    Marital Status: Divorced    Tobacco Counseling Counseling given: Not Answered   Clinical Intake:  Pre-visit preparation completed: Yes  Pain : No/denies pain     BMI - recorded: 30.56 Nutritional Status: BMI > 30  Obese Diabetes: No  How often do you need to have someone help you when you read instructions, pamphlets, or other written materials from your doctor or pharmacy?: 1 - Never  Diabetic?no  Interpreter Needed?: No  Information entered by :: Lanier Ensign, LPN   Activities of Daily Living    05/14/2023   10:14 AM 05/12/2023    8:54 AM  In your present state of health, do you have any difficulty performing the following activities:  Hearing? 0 0  Vision? 0 0  Difficulty concentrating or making decisions? 0 0  Walking or climbing stairs? 0 0  Dressing or bathing? 0 0  Doing errands, shopping? 0 0  Preparing Food and eating ? N N  Using the Toilet? N N  In the past six months, have you accidently leaked urine? N N  Do you have problems with loss of bowel control? N N  Managing your Medications? N N  Managing your Finances? N N  Housekeeping or managing your Housekeeping? N N    Patient Care  Team: Shelva Majestic, MD as PCP - General (Family Medicine) Loreli Slot, MD as Consulting Physician (Cardiothoracic Surgery)  Indicate any recent Medical Services you may have received from other than [facility] providers in the past year (date may be approximate).     Assessment:   This is a routine wellness examination for Jerimia.  Hearing/Vision screen Hearing Screening - Comments:: Pt denies any hearing issues  Vision Screening - Comments:: Pt follows up with [facility] for [facility] exams   Dietary issues and exercise activities discussed:     Goals Addressed             This Visit's Progress    Patient Stated       None at this time        Depression Screen    05/16/2023   12:05 PM 02/04/2023    8:13 AM 05/08/2022   11:08 AM 01/31/2022    8:07 AM 01/27/2021    9:19 AM 11/07/2020    3:51 PM 10/27/2019    2:08 PM  PHQ 2/9 Scores  PHQ - 2 Score 0 0 0 0 0 0 0  PHQ- 9 Score  1         Fall Risk    05/14/2023   10:14 AM 05/12/2023    8:54 AM 02/04/2023    8:13 AM 05/08/2022   11:10 AM 01/31/2022    8:07 AM  Fall Risk   Falls in the past year? 0 0 0 0 0  Number falls in past yr: 0 0 0 0 0  Injury with Fall? 0 0 0 0 0  Risk for fall due to :   No Fall Risks Impaired vision No Fall Risks  Follow up   Falls evaluation completed Falls prevention discussed Falls evaluation completed    FALL RISK PREVENTION PERTAINING TO THE HOME:  Any stairs in or around the home? Yes  If so, are there  any without handrails? No  Home free of loose throw rugs in walkways, pet beds, electrical cords, etc? Yes  Adequate lighting in your home to reduce risk of falls? Yes   ASSISTIVE DEVICES UTILIZED TO PREVENT FALLS:  Life alert? No  Use of a cane, walker or w/c? No  Grab bars in the bathroom? No  Shower chair or bench in shower? No  Elevated toilet seat or a handicapped toilet? No   TIMED UP AND GO:  Was the test performed? No .  Cognitive Function:        05/08/2022    11:12 AM  6CIT Screen  What Year? 0 points  What month? 0 points  What time? 0 points  Count back from 20 0 points  Months in reverse 0 points  Repeat phrase 0 points  Total Score 0 points    Immunizations Immunization History  Administered Date(s) Administered   Influenza Inj Mdck Quad Pf 10/29/2017   Influenza Split 10/16/2012, 09/23/2013, 12/24/2016   Influenza, Seasonal, Injecte, Preservative Fre 10/01/2015, 12/24/2016   Influenza,inj,Quad PF,6+ Mos 10/01/2015, 10/21/2018, 09/14/2019, 09/29/2020   Influenza-Unspecified 10/18/2014, 10/01/2015, 12/24/2016, 10/04/2021, 11/17/2022   PFIZER Comirnaty(Gray Top)Covid-19 Tri-Sucrose Vaccine 03/30/2021   PFIZER(Purple Top)SARS-COV-2 Vaccination 02/14/2020, 03/09/2020, 10/14/2020, 03/30/2021   Pneumococcal Conjugate-13 01/27/2021   Pneumococcal Polysaccharide-23 02/11/2017   RSV,unspecified 11/17/2022   Tdap 12/24/2010   Zoster Recombinat (Shingrix) 11/01/2017, 01/31/2018   Zoster, Live 12/24/2010     TDAP status: Up to date  Flu Vaccine status: Up to date  Pneumococcal vaccine status: Up to date  Covid-19 vaccine status: Completed vaccines  Qualifies for Shingles Vaccine? Yes   Zostavax completed Yes   Shingrix Completed?: Yes  Screening Tests Health Maintenance  Topic Date Due   COVID-19 Vaccine (6 - 2023-24 season) 08/24/2022   Fecal DNA (Cologuard)  05/20/2023   INFLUENZA VACCINE  07/25/2023   Medicare Annual Wellness (AWV)  05/15/2024   Pneumonia Vaccine 37+ Years old (3 of 3 - PPSV23 or PCV20) 01/27/2026   Hepatitis C Screening  Completed   Zoster Vaccines- Shingrix  Completed   HPV VACCINES  Aged Out   DTaP/Tdap/Td  Discontinued   COLONOSCOPY (Pts 45-62yrs Insurance coverage will need to be confirmed)  Discontinued    Health Maintenance  Health Maintenance Due  Topic Date Due   COVID-19 Vaccine (6 - 2023-24 season) 08/24/2022    Colorectal cancer screening: Type of screening: Cologuard. Completed  05/19/20. Repeat every 3 years  Additional Screening:  Hepatitis C Screening:  Completed 07/19/16  Vision Screening: Recommended annual ophthalmology exams for early detection of glaucoma and other disorders of the eye. Is the patient up to date with their annual eye exam?  Yes  Who is the provider or what is the name of the office in which the patient attends annual eye exams? [facility] If pt is not established with a provider, would they like to be referred to a provider to establish care? No .   Dental Screening: Recommended annual dental exams for proper oral hygiene  Community Resource Referral / Chronic Care Management: CRR required this visit?  No   CCM required this visit?  No      Plan:     I have personally reviewed and noted the following in the patient's chart:   Medical and social history Use of alcohol, tobacco or illicit drugs  Current medications and supplements including opioid prescriptions. Patient is not currently taking opioid prescriptions. Functional ability and status Nutritional status Physical  activity Advanced directives List of other physicians Hospitalizations, surgeries, and ER visits in previous 12 months Vitals Screenings to include cognitive, depression, and falls Referrals and appointments  In addition, I have reviewed and discussed with patient certain preventive protocols, quality metrics, and best practice recommendations. A written personalized care plan for preventive services as well as general preventive health recommendations were provided to patient.     Marzella Schlein, LPN   04/01/8118   Nurse Notes: none

## 2023-05-16 NOTE — Patient Instructions (Signed)
Ricardo Garcia , Thank you for taking time to come for your Medicare Wellness Visit. I appreciate your ongoing commitment to your health goals. Please review the following plan we discussed and let me know if I can assist you in the future.   These are the goals we discussed:  Goals      Patient Stated     Lose weight      Patient Stated     None at this time         This is a list of the screening recommended for you and due dates:  Health Maintenance  Topic Date Due   COVID-19 Vaccine (6 - 2023-24 season) 08/24/2022   Cologuard (Stool DNA test)  05/20/2023   Flu Shot  07/25/2023   Medicare Annual Wellness Visit  05/15/2024   Pneumonia Vaccine (3 of 3 - PPSV23 or PCV20) 01/27/2026   Hepatitis C Screening: USPSTF Recommendation to screen - Ages 79-79 yo.  Completed   Zoster (Shingles) Vaccine  Completed   HPV Vaccine  Aged Out   DTaP/Tdap/Td vaccine  Discontinued   Colon Cancer Screening  Discontinued    Advanced directives: Please bring a copy of your health care power of attorney and living will to the office at your convenience.  Conditions/risks identified: stay healthy   Next appointment: Follow up in one year for your annual wellness visit.   Preventive Care 66 Years and Older, Male  Preventive care refers to lifestyle choices and visits with your health care provider that can promote health and wellness. What does preventive care include? A yearly physical exam. This is also called an annual well check. Dental exams once or twice a year. Routine eye exams. Ask your health care provider how often you should have your eyes checked. Personal lifestyle choices, including: Daily care of your teeth and gums. Regular physical activity. Eating a healthy diet. Avoiding tobacco and drug use. Limiting alcohol use. Practicing safe sex. Taking low doses of aspirin every day. Taking vitamin and mineral supplements as recommended by your health care provider. What happens during  an annual well check? The services and screenings done by your health care provider during your annual well check will depend on your age, overall health, lifestyle risk factors, and family history of disease. Counseling  Your health care provider may ask you questions about your: Alcohol use. Tobacco use. Drug use. Emotional well-being. Home and relationship well-being. Sexual activity. Eating habits. History of falls. Memory and ability to understand (cognition). Work and work Astronomer. Screening  You may have the following tests or measurements: Height, weight, and BMI. Blood pressure. Lipid and cholesterol levels. These may be checked every 5 years, or more frequently if you are over 3 years old. Skin check. Lung cancer screening. You may have this screening every year starting at age 81 if you have a 30-pack-year history of smoking and currently smoke or have quit within the past 15 years. Fecal occult blood test (FOBT) of the stool. You may have this test every year starting at age 55. Flexible sigmoidoscopy or colonoscopy. You may have a sigmoidoscopy every 5 years or a colonoscopy every 10 years starting at age 43. Prostate cancer screening. Recommendations will vary depending on your family history and other risks. Hepatitis C blood test. Hepatitis B blood test. Sexually transmitted disease (STD) testing. Diabetes screening. This is done by checking your blood sugar (glucose) after you have not eaten for a while (fasting). You may have this done every  1-3 years. Abdominal aortic aneurysm (AAA) screening. You may need this if you are a current or former smoker. Osteoporosis. You may be screened starting at age 72 if you are at high risk. Talk with your health care provider about your test results, treatment options, and if necessary, the need for more tests. Vaccines  Your health care provider may recommend certain vaccines, such as: Influenza vaccine. This is recommended  every year. Tetanus, diphtheria, and acellular pertussis (Tdap, Td) vaccine. You may need a Td booster every 10 years. Zoster vaccine. You may need this after age 65. Pneumococcal 13-valent conjugate (PCV13) vaccine. One dose is recommended after age 58. Pneumococcal polysaccharide (PPSV23) vaccine. One dose is recommended after age 35. Talk to your health care provider about which screenings and vaccines you need and how often you need them. This information is not intended to replace advice given to you by your health care provider. Make sure you discuss any questions you have with your health care provider. Document Released: 01/06/2016 Document Revised: 08/29/2016 Document Reviewed: 10/11/2015 Elsevier Interactive Patient Education  2017 [employer].  Fall Prevention in the Home Falls can cause injuries. They can happen to people of all ages. There are many things you can do to make your home safe and to help prevent falls. What can I do on the outside of my home? Regularly fix the edges of walkways and driveways and fix any cracks. Remove anything that might make you trip as you walk through a door, such as a raised step or threshold. Trim any bushes or trees on the path to your home. Use bright outdoor lighting. Clear any walking paths of anything that might make someone trip, such as rocks or tools. Regularly check to see if handrails are loose or broken. Make sure that both sides of any steps have handrails. Any raised decks and porches should have guardrails on the edges. Have any leaves, snow, or ice cleared regularly. Use sand or salt on walking paths during winter. Clean up any spills in your garage right away. This includes oil or grease spills. What can I do in the bathroom? Use night lights. Install grab bars by the toilet and in the tub and shower. Do not use towel bars as grab bars. Use non-skid mats or decals in the tub or shower. If you need to sit down in the shower,  use a plastic, non-slip stool. Keep the floor dry. Clean up any water that spills on the floor as soon as it happens. Remove soap buildup in the tub or shower regularly. Attach bath mats securely with double-sided non-slip rug tape. Do not have throw rugs and other things on the floor that can make you trip. What can I do in the bedroom? Use night lights. Make sure that you have a light by your bed that is easy to reach. Do not use any sheets or blankets that are too big for your bed. They should not hang down onto the floor. Have a firm chair that has side arms. You can use this for support while you get dressed. Do not have throw rugs and other things on the floor that can make you trip. What can I do in the kitchen? Clean up any spills right away. Avoid walking on wet floors. Keep items that you use a lot in easy-to-reach places. If you need to reach something above you, use a strong step stool that has a grab bar. Keep electrical cords out of the way.  Do not use floor polish or wax that makes floors slippery. If you must use wax, use non-skid floor wax. Do not have throw rugs and other things on the floor that can make you trip. What can I do with my stairs? Do not leave any items on the stairs. Make sure that there are handrails on both sides of the stairs and use them. Fix handrails that are broken or loose. Make sure that handrails are as long as the stairways. Check any carpeting to make sure that it is firmly attached to the stairs. Fix any carpet that is loose or worn. Avoid having throw rugs at the top or bottom of the stairs. If you do have throw rugs, attach them to the floor with carpet tape. Make sure that you have a light switch at the top of the stairs and the bottom of the stairs. If you do not have them, ask someone to add them for you. What else can I do to help prevent falls? Wear shoes that: Do not have high heels. Have rubber bottoms. Are comfortable and fit you  well. Are closed at the toe. Do not wear sandals. If you use a stepladder: Make sure that it is fully opened. Do not climb a closed stepladder. Make sure that both sides of the stepladder are locked into place. Ask someone to hold it for you, if possible. Clearly mark and make sure that you can see: Any grab bars or handrails. First and last steps. Where the edge of each step is. Use tools that help you move around (mobility aids) if they are needed. These include: Canes. Walkers. Scooters. Crutches. Turn on the lights when you go into a dark area. Replace any light bulbs as soon as they burn out. Set up your furniture so you have a clear path. Avoid moving your furniture around. If any of your floors are uneven, fix them. If there are any pets around you, be aware of where they are. Review your medicines with your doctor. Some medicines can make you feel dizzy. This can increase your chance of falling. Ask your doctor what other things that you can do to help prevent falls. This information is not intended to replace advice given to you by your health care provider. Make sure you discuss any questions you have with your health care provider. Document Released: 10/06/2009 Document Revised: 05/17/2016 Document Reviewed: 01/14/2015 Elsevier Interactive Patient Education  2017 [employer].

## 2023-05-21 ENCOUNTER — Encounter: Payer: Self-pay | Admitting: Family Medicine

## 2023-05-22 ENCOUNTER — Other Ambulatory Visit: Payer: Self-pay

## 2023-05-22 DIAGNOSIS — Z1211 Encounter for screening for malignant neoplasm of colon: Secondary | ICD-10-CM

## 2023-06-17 LAB — COLOGUARD: COLOGUARD: POSITIVE — AB

## 2023-06-18 ENCOUNTER — Encounter: Payer: Self-pay | Admitting: Family Medicine

## 2023-06-18 ENCOUNTER — Other Ambulatory Visit: Payer: Self-pay

## 2023-06-18 DIAGNOSIS — R195 Other fecal abnormalities: Secondary | ICD-10-CM

## 2023-06-19 ENCOUNTER — Other Ambulatory Visit: Payer: Self-pay

## 2023-06-19 ENCOUNTER — Encounter: Payer: Self-pay | Admitting: Family Medicine

## 2023-06-24 ENCOUNTER — Encounter: Payer: Self-pay | Admitting: Gastroenterology

## 2023-07-12 ENCOUNTER — Other Ambulatory Visit: Payer: Self-pay

## 2023-07-12 ENCOUNTER — Encounter: Payer: Self-pay | Admitting: Family Medicine

## 2023-07-24 ENCOUNTER — Encounter (INDEPENDENT_AMBULATORY_CARE_PROVIDER_SITE_OTHER): Payer: Self-pay

## 2023-07-30 ENCOUNTER — Encounter: Payer: Self-pay | Admitting: Family Medicine

## 2023-07-31 ENCOUNTER — Ambulatory Visit (AMBULATORY_SURGERY_CENTER): Payer: Medicare Other

## 2023-07-31 VITALS — Ht 74.0 in | Wt 222.0 lb

## 2023-07-31 DIAGNOSIS — R195 Other fecal abnormalities: Secondary | ICD-10-CM

## 2023-07-31 DIAGNOSIS — Z1211 Encounter for screening for malignant neoplasm of colon: Secondary | ICD-10-CM

## 2023-07-31 MED ORDER — NA SULFATE-K SULFATE-MG SULF 17.5-3.13-1.6 GM/177ML PO SOLN: 1.0000 | Freq: Once | ORAL | 0 refills | Status: AC

## 2023-07-31 NOTE — Progress Notes (Signed)
No egg or soy allergy known to patient  No issues known to pt with past sedation with any surgeries or procedures Patient denies ever being told they had issues or difficulty with intubation  No FH of Malignant Hyperthermia Pt is not on diet pills Pt is not on  home 02  Pt is not on blood thinners  Pt denies issues with constipation  No A fib or A flutter Have any cardiac testing pending--no LOA: independent  Prep: suprep   Patient's chart reviewed by Cathlyn Parsons CNRA prior to previsit and patient appropriate for the [facility].  Previsit completed and red dot placed by patient's name on their procedure day (on provider's schedule).     PV competed with patient. Prep instructions sent via mychart. Patients has goodrx card on file at the pharmacy.

## 2023-08-06 ENCOUNTER — Encounter: Payer: Self-pay | Admitting: Family Medicine

## 2023-08-06 ENCOUNTER — Ambulatory Visit: Payer: Medicare Other | Admitting: Family Medicine

## 2023-08-06 VITALS — BP 122/76 | HR 73 | Temp 97.1°F | Ht 74.0 in | Wt 227.0 lb

## 2023-08-06 DIAGNOSIS — Z131 Encounter for screening for diabetes mellitus: Secondary | ICD-10-CM | POA: Diagnosis not present

## 2023-08-06 DIAGNOSIS — E785 Hyperlipidemia, unspecified: Secondary | ICD-10-CM | POA: Diagnosis not present

## 2023-08-06 DIAGNOSIS — I10 Essential (primary) hypertension: Secondary | ICD-10-CM

## 2023-08-06 DIAGNOSIS — R739 Hyperglycemia, unspecified: Secondary | ICD-10-CM | POA: Diagnosis not present

## 2023-08-06 LAB — COMPREHENSIVE METABOLIC PANEL
ALT: 18 U/L (ref 0–53)
AST: 19 U/L (ref 0–37)
Albumin: 4.5 g/dL (ref 3.5–5.2)
Alkaline Phosphatase: 44 U/L (ref 39–117)
BUN: 18 mg/dL (ref 6–23)
CO2: 29 mEq/L (ref 19–32)
Calcium: 10.1 mg/dL (ref 8.4–10.5)
Chloride: 96 mEq/L (ref 96–112)
Creatinine, Ser: 1.26 mg/dL (ref 0.40–1.50)
GFR: 58.83 mL/min — ABNORMAL LOW (ref >60.00)
Glucose, Bld: 101 mg/dL — ABNORMAL HIGH (ref 70–99)
Potassium: 4.4 mEq/L (ref 3.5–5.1)
Sodium: 133 mEq/L — ABNORMAL LOW (ref 135–145)
Total Bilirubin: 0.5 mg/dL (ref 0.2–1.2)
Total Protein: 7.4 g/dL (ref 6.0–8.3)

## 2023-08-06 LAB — HEMOGLOBIN A1C: Hgb A1c MFr Bld: 5.8 % (ref 4.6–6.5)

## 2023-08-06 LAB — LDL CHOLESTEROL, DIRECT: Direct LDL: 110 mg/dL

## 2023-08-06 NOTE — Patient Instructions (Addendum)
Let us know if you get a flu or COVID vaccine this fall.  Please stop by lab before you go If you have mychart- we will send your results within 3 business days of [place] receiving them.  If you do not have mychart- we will call you about results within 5 business days of [place] receiving them.  *please also note that you will see labs on mychart as soon as they post. I will later go in and write notes on them- will say "notes from Dr. Durene Cal"   Recommended follow up: Return in about 6 months (around 02/06/2024) for followup or sooner if needed.Schedule b4 you leave.

## 2023-08-06 NOTE — Progress Notes (Signed)
Phone [phone] In person visit   Subjective:   Ricardo Garcia is a 67 y.o. year old very pleasant male patient who presents for/with See problem oriented charting Chief Complaint  Patient presents with   Medical Management of Chronic Issues   Hypertension    Past Medical History-  Patient Active Problem List   Diagnosis Date Noted   PVC's (premature ventricular contractions) 07/06/2019    Priority: Medium    Eczema 04/15/2017    Priority: Medium    Sinusitis, chronic 07/28/2013    Priority: Medium    Hypertension 01/26/2012    Priority: Medium    Hyperlipidemia 01/26/2012    Priority: Medium     Medications- reviewed and updated Current Outpatient Medications  Medication Sig Dispense Refill   Bromelains 500 MG TABS Take 1 tablet by mouth daily.      clobetasol (TEMOVATE) 0.05 % external solution Apply 1 application topically 2 (two) times daily. 7 days maximum. For scalp (Patient taking differently: Apply 1 application  topically daily as needed. As needed.) 50 mL 2   diltiazem (CARDIZEM CD) 120 MG 24 hr capsule TAKE 1 CAPSULE BY MOUTH EVERY DAY 90 capsule 3   lisinopril (ZESTRIL) 20 MG tablet TAKE 1 TABLET DAILY 90 tablet 3   mometasone (ELOCON) 0.1 % cream Apply 1 application. topically daily.     Multiple Vitamin (MULTIVITAMIN) capsule Take 1 capsule by mouth daily.     ranolazine (RANEXA) 500 MG 12 hr tablet Take 500 mg by mouth 2 (two) times daily.     rosuvastatin (CRESTOR) 10 MG tablet TAKE 1 TABLET DAILY 90 tablet 3   VITAMIN D PO Take by mouth.     sildenafil (VIAGRA) 100 MG tablet Take 1 tablet (100 mg total) by mouth daily as needed for erectile dysfunction. (Patient not taking: Reported on 08/06/2023) 30 tablet 5   No current facility-administered medications for this visit.     Objective:  BP 122/76   Pulse 73   Temp (!) 97.1 F (36.2 C)   Ht 6\' 2"  (1.88 m)   Wt 227 lb (103 kg)   SpO2 96%   BMI 29.15 kg/m  Gen: NAD, resting comfortably CV: RRR  no murmurs rubs or gallops Lungs: CTAB no crackles, wheeze, rhonchi Ext: no edema Skin: warm, dry     Assessment and Plan   #Cologuard positive from a month ago- upcoming colonoscopy  #hypertension/PVC's-  [facility]  S: medication: diltiazem 120 mg XR (generally controls PACs/PVC's) but in 2024 ranexa started by [facility] and helpful- had 3 week stretch of this and now better/improved, lisinopril 20 mg BP Readings from Last 3 Encounters:  08/06/23 122/76  02/04/23 110/70  11/23/22 132/78  Home readings #s: 120s/70s at home for most part A/P: hypertension - stable- continue current medicines  PVC's- improved but not entirely clear if its related to ranexa or just the natural variability of his pattern  #hyperlipidemia- myalgias so have not increased dose as well as prediabetes  S: Medication:rosuvastatin 10 mg daily  -down 11 lbs and has cleaned up diet- in gym most days Lab Results  Component Value Date   CHOL 194 02/04/2023   HDL 60.30 02/04/2023   LDLCALC 111 (H) 02/04/2023   TRIG 110.0 02/04/2023   CHOLHDL 3 02/04/2023   A/P: hopefully improved- he has made some very positive changes- update with labs today  # Hyperglycemia/insulin resistance/prediabetes- a1c 5.31 January 2023 S:  Medication: none Exercise and diet- doing a great job with  diet/exercise/weight loss A/P: hopefully stable or improved- u  #Chronic sinusitis - ongoing issues for years- sees [place]  ENT but breathing a pot of steam each morning was very helpful- gets large volume out- his ENT in [place]- doing reasonably well lately   Recommended follow up: Return in about 6 months (around 02/06/2024) for followup or sooner if needed.Schedule b4 you leave. Future Appointments  Date Time Provider Department Center  08/14/2023 10:30 AM Lynann Bologna, MD [facility]  05/21/2024 11:30 AM [facility] ANNUAL WELLNESS VISIT 1 [facility]   Lab/Order associations:   ICD-10-CM   1. Primary hypertension  I10  LDL cholesterol, direct    Comprehensive metabolic panel    2. Hyperlipidemia, unspecified hyperlipidemia type  E78.5 LDL cholesterol, direct    Comprehensive metabolic panel    3. Screening for diabetes mellitus  Z13.1 Hemoglobin A1c    4. Hyperglycemia  R73.9 Hemoglobin A1c     No orders of the defined types were placed in this encounter.  Return precautions advised.  Tana Conch, MD

## 2023-08-06 NOTE — Addendum Note (Signed)
Addended by: Shelva Majestic on: 08/06/2023 06:08 PM   Modules accepted: Level of Service

## 2023-08-07 ENCOUNTER — Encounter: Payer: Self-pay | Admitting: Family Medicine

## 2023-08-14 ENCOUNTER — Ambulatory Visit (AMBULATORY_SURGERY_CENTER): Payer: Medicare Other | Admitting: Gastroenterology

## 2023-08-14 ENCOUNTER — Encounter: Payer: Self-pay | Admitting: Family Medicine

## 2023-08-14 ENCOUNTER — Encounter: Payer: Self-pay | Admitting: Gastroenterology

## 2023-08-14 VITALS — BP 100/64 | HR 55 | Temp 96.6°F | Resp 14 | Ht 74.0 in | Wt 222.0 lb

## 2023-08-14 DIAGNOSIS — Z1211 Encounter for screening for malignant neoplasm of colon: Secondary | ICD-10-CM

## 2023-08-14 DIAGNOSIS — R195 Other fecal abnormalities: Secondary | ICD-10-CM | POA: Diagnosis not present

## 2023-08-14 MED ORDER — SODIUM CHLORIDE 0.9 % IV SOLN: 500.0000 mL | Freq: Once | INTRAVENOUS | Status: DC

## 2023-08-14 NOTE — Op Note (Signed)
[facility] Patient Name: Ricardo Garcia Procedure Date: 08/14/2023 10:32 AM MRN: [id number] Endoscopist: Lynann Bologna , MD, [id number] Age: 67 Referring MD:  Date of Birth: 1956-07-23 Gender: Male Account #: [id number] Procedure:                Colonoscopy Indications:              Positive Cologuard test. Neg colon 2011 Medicines:                Monitored Anesthesia Care Procedure:                Pre-Anesthesia Assessment:                           - Prior to the procedure, a History and Physical                            was performed, and patient medications and                            allergies were reviewed. The patient's tolerance of                            previous anesthesia was also reviewed. The risks                            and benefits of the procedure and the sedation                            options and risks were discussed with the patient.                            All questions were answered, and informed consent                            was obtained. Prior Anticoagulants: The patient has                            taken no anticoagulant or antiplatelet agents. ASA                            Grade Assessment: II - A patient with mild systemic                            disease. After reviewing the risks and benefits,                            the patient was deemed in satisfactory condition to                            undergo the procedure.                           After obtaining informed consent, the colonoscope  was passed under direct vision. Throughout the                            procedure, the patient's blood pressure, pulse, and                            oxygen saturations were monitored continuously. The                            CF [id number] #[id number] was introduced through the anus                            and advanced to the the cecum, identified by                            appendiceal orifice and  ileocecal valve. The                            colonoscopy was performed without difficulty. The                            patient tolerated the procedure well. The quality                            of the bowel preparation was good. The ileocecal                            valve, appendiceal orifice, and rectum were                            photographed. Scope In: 10:46:26 AM Scope Out: 11:02:07 AM Scope Withdrawal Time: 0 hours 10 minutes 35 seconds  Total Procedure Duration: 0 hours 15 minutes 41 seconds  Findings:                 Multiple medium-mouthed diverticula were found in                            the sigmoid colon, few in descending colon,                            transverse colon, ascending colon and one in cecum.                           Non-bleeding internal hemorrhoids were found during                            retroflexion. The hemorrhoids were small and Grade                            I (internal hemorrhoids that do not prolapse).                           The exam was otherwise without abnormality on  direct and retroflexion views. Complications:            No immediate complications. Estimated Blood Loss:     Estimated blood loss: none. Impression:               - Pancolonic diverticulosis predominantly in the                            sigmoid colon.                           - Non-bleeding internal hemorrhoids.                           - The examination was otherwise normal on direct                            and retroflexion views.                           - No specimens collected. Recommendation:           - Patient has a contact number available for                            emergencies. The signs and symptoms of potential                            delayed complications were discussed with the                            patient. Return to normal activities tomorrow.                            Written discharge  instructions were provided to the                            patient.                           - High fiber diet.                           - Continue present medications.                           - Repeat colonoscopy is not recommended due to                            current age (61 years or older) for screening                            purposes. Hence repeat colonoscopy only if with any                            new problems.                           -  The findings and recommendations were discussed                            with the patient's family. Lynann Bologna, MD 08/14/2023 11:06:47 AM This report has been signed electronically.

## 2023-08-14 NOTE — Progress Notes (Signed)
[facility] History and Physical   Primary Care Physician:  Shelva Majestic, MD   Reason for Procedure:   + cologuard  Plan:    colon     HPI: Ricardo Garcia is a 67 y.o. male    Past Medical History:  Diagnosis Date   Diverticulitis    Hyperlipidemia    Hypertension    PONV (postoperative nausea and vomiting)    when he was 67 years old    Past Surgical History:  Procedure Laterality Date    bone spur removal     2016- foot   ARTHROTOMY Right 11/15/2015   Procedure: RIGHT ANKLE ARTHROTOMY ;  Surgeon: Marcene Corning, MD;  Location: [facility] OR;  Service: Orthopedics;  Laterality: Right;   balloon septoplasty     COLONOSCOPY  2011   ELECTROMAGNETIC NAVIGATION BROCHOSCOPY  12/20/2016   FOREIGN BODY REMOVAL Right 11/15/2015   Procedure: RIGHT ANKLE LOOSE BODY REMOVAL;  Surgeon: Marcene Corning, MD;  Location: [facility] OR;  Service: Orthopedics;  Laterality: Right;   HERNIA REPAIR     67 years old   LUNG BIOPSY Right 03/07/2017   Procedure: RIGHT LUNG BIOPSY;  Surgeon: Loreli Slot, MD;  Location: [facility] OR;  Service: Thoracic;  Laterality: Right;   LYMPH NODE DISSECTION Right 03/07/2017   Procedure: LYMPH NODE DISSECTION, right lung;  Surgeon: Loreli Slot, MD;  Location: [facility] OR;  Service: Thoracic;  Laterality: Right;   SEGMENTECOMY Right 03/07/2017   Procedure: RIGHT LOWER LOBE SUPERIOR SEGMENTECTOMY;  Surgeon: Loreli Slot, MD;  Location: [facility] OR;  Service: Thoracic;  Laterality: Right;   VIDEO ASSISTED THORACOSCOPY (VATS)/WEDGE RESECTION Right 03/07/2017   Procedure: RIGHT LUNG VIDEO ASSISTED THORACOSCOPY (VATS)/WEDGE RESECTION;  Surgeon: Loreli Slot, MD;  Location: [facility] OR;  Service: Thoracic;  Laterality: Right;    Prior to Admission medications   Medication Sig Start Date End Date Taking? Authorizing Provider  Bromelains 500 MG TABS Take 1 tablet by mouth daily.    Yes [provider]  diltiazem (CARDIZEM CD) 120 MG 24 hr capsule TAKE  1 CAPSULE BY MOUTH EVERY DAY 02/12/23  Yes Shelva Majestic, MD  lisinopril (ZESTRIL) 20 MG tablet TAKE 1 TABLET DAILY 01/21/23  Yes Shelva Majestic, MD  Multiple Vitamin (MULTIVITAMIN) capsule Take 1 capsule by mouth daily.   Yes [provider]  ranolazine (RANEXA) 500 MG 12 hr tablet Take 500 mg by mouth 2 (two) times daily. 06/19/23  Yes [provider]  rosuvastatin (CRESTOR) 10 MG tablet TAKE 1 TABLET DAILY 01/21/23  Yes Shelva Majestic, MD  VITAMIN D PO Take by mouth.   Yes [provider]  clobetasol (TEMOVATE) 0.05 % external solution Apply 1 application topically 2 (two) times daily. 7 days maximum. For scalp Patient taking differently: Apply 1 application  topically daily as needed. As needed. 06/21/20   Shelva Majestic, MD  mometasone (ELOCON) 0.1 % cream Apply 1 application. topically daily.    [provider]  sildenafil (VIAGRA) 100 MG tablet Take 1 tablet (100 mg total) by mouth daily as needed for erectile dysfunction. Patient not taking: Reported on 08/06/2023 02/04/23   Shelva Majestic, MD    Current Outpatient Medications  Medication Sig Dispense Refill   Bromelains 500 MG TABS Take 1 tablet by mouth daily.      diltiazem (CARDIZEM CD) 120 MG 24 hr capsule TAKE 1 CAPSULE BY MOUTH EVERY DAY 90 capsule 3   lisinopril (ZESTRIL) 20 MG  tablet TAKE 1 TABLET DAILY 90 tablet 3   Multiple Vitamin (MULTIVITAMIN) capsule Take 1 capsule by mouth daily.     ranolazine (RANEXA) 500 MG 12 hr tablet Take 500 mg by mouth 2 (two) times daily.     rosuvastatin (CRESTOR) 10 MG tablet TAKE 1 TABLET DAILY 90 tablet 3   VITAMIN D PO Take by mouth.     clobetasol (TEMOVATE) 0.05 % external solution Apply 1 application topically 2 (two) times daily. 7 days maximum. For scalp (Patient taking differently: Apply 1 application  topically daily as needed. As needed.) 50 mL 2   mometasone (ELOCON) 0.1 % cream Apply 1 application. topically daily.     sildenafil  (VIAGRA) 100 MG tablet Take 1 tablet (100 mg total) by mouth daily as needed for erectile dysfunction. (Patient not taking: Reported on 08/06/2023) 30 tablet 5   Current Facility-Administered Medications  Medication Dose Route Frequency Provider Last Rate Last Admin   0.9 %  sodium chloride infusion  500 mL Intravenous Once Lynann Bologna, MD        Allergies as of 08/14/2023 - Review Complete 08/14/2023  Allergen Reaction Noted   Levaquin [levofloxacin] Other (See Comments) 02/07/2017   Doxycycline  02/04/2023    Family History  Problem Relation Age of Onset   Hypertension Mother        uncontrolled   Stroke Mother        88   Other Mother        died- fell after stroke and broke ribs- "lungs filled with fluid"   Other Father        unknown cause of death age 62   Rheum arthritis Father    Other Brother        some step siblingsonly   Colon cancer Neg Hx    Colon polyps Neg Hx    Esophageal cancer Neg Hx    Rectal cancer Neg Hx    Stomach cancer Neg Hx     Social History   Socioeconomic History   Marital status: Divorced    Spouse name: Not on file   Number of children: Not on file   Years of education: Not on file   Highest education level: Not on file  Occupational History   Not on file  Tobacco Use   Smoking status: Never   Smokeless tobacco: Never  Vaping Use   Vaping status: Never Used  Substance and Sexual Activity   Alcohol use: Yes    Alcohol/week: 8.0 standard drinks of alcohol    Types: 1 Glasses of wine, 7 Standard drinks or equivalent per week    Comment: had been daily- quit 2019   Drug use: No   Sexual activity: Yes    Partners: Female  Other Topics Concern   Not on file  Social History Narrative   Divorced. Lives alone. 2 children- son 22 at [place], daughter 55- [employer].       [facility].    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: gym 4-5 days a week, guitar, time with friends and family   Social  Determinants of Health   Financial Resource Strain: Low Risk  (06/19/2023)   Received from [employer], [facility]   Overall Financial Resource Strain (CARDIA)    Difficulty of Paying Living Expenses: Not hard at all  Food Insecurity: No Food Insecurity (06/19/2023)   Received from [facility], [facility]  Hunger Vital Sign    Worried About Running Out of Food in the Last Year: Never true    Ran Out of Food in the Last Year: Never true  Transportation Needs: No Transportation Needs (06/19/2023)   Received from [facility], [facility]   PRAPARE - Transportation    Lack of Transportation (Medical): No    Lack of Transportation (Non-Medical): No  Physical Activity: Sufficiently Active (05/14/2023)   Exercise Vital Sign    Days of Exercise per Week: 5 days    Minutes of Exercise per Session: 60 min  Stress: No Stress Concern Present (05/14/2023)   [employer] of Occupational Health - Occupational Stress Questionnaire    Feeling of Stress : Not at all  Social Connections: Moderately Isolated (05/14/2023)   Social Connection and Isolation Panel [NHANES]    Frequency of Communication with Friends and Family: More than three times a week    Frequency of Social Gatherings with Friends and Family: Twice a week    Attends Religious Services: Never    Database administrator or Organizations: Yes    Attends Engineer, structural: More than 4 times per year    Marital Status: Divorced  Intimate Partner Violence: Not At Risk (05/16/2023)   Humiliation, Afraid, Rape, and Kick questionnaire    Fear of Current or Ex-Partner: No    Emotionally Abused: No    Physically Abused: No    Sexually Abused: No    Review of Systems: Positive for none All other review of systems negative except as mentioned in the HPI.  Physical Exam: Vital signs in last 24 hours: @VSRANGES @   General:   Alert,  Well-developed, well-nourished, pleasant and cooperative in NAD Lungs:  Clear  throughout to auscultation.   Heart:  Regular rate and rhythm; no murmurs, clicks, rubs,  or gallops. Abdomen:  Soft, nontender and nondistended. Normal bowel sounds.   Neuro/Psych:  Alert and cooperative. Normal mood and affect. A and O x 3    No significant changes were identified.  The patient continues to be an appropriate candidate for the planned procedure and anesthesia.   Edman Circle, MD. [facility] 08/14/2023 10:40 AM@

## 2023-08-14 NOTE — Patient Instructions (Signed)
Please read handouts provided. Continue present medications. High Fiber Diet.   YOU HAD AN ENDOSCOPIC PROCEDURE TODAY AT [facility]:   Refer to the procedure report that was given to you for any specific questions about what was found during the examination.  If the procedure report does not answer your questions, please call your gastroenterologist to clarify.  If you requested that your care partner not be given the details of your procedure findings, then the procedure report has been included in a sealed envelope for you to review at your convenience later.  YOU SHOULD EXPECT: Some feelings of bloating in the abdomen. Passage of more gas than usual.  Walking can help get rid of the air that was put into your GI tract during the procedure and reduce the bloating. If you had a lower endoscopy (such as a colonoscopy or flexible sigmoidoscopy) you may notice spotting of blood in your stool or on the toilet paper. If you underwent a bowel prep for your procedure, you may not have a normal bowel movement for a few days.  Please Note:  You might notice some irritation and congestion in your nose or some drainage.  This is from the oxygen used during your procedure.  There is no need for concern and it should clear up in a day or so.  SYMPTOMS TO REPORT IMMEDIATELY:  Following lower endoscopy (colonoscopy or flexible sigmoidoscopy):  Excessive amounts of blood in the stool  Significant tenderness or worsening of abdominal pains  Swelling of the abdomen that is new, acute  Fever of 100F or higher  For urgent or emergent issues, a gastroenterologist can be reached at any hour by calling [phone]. Do not use MyChart messaging for urgent concerns.    DIET:  We do recommend a small meal at first, but then you may proceed to your regular diet.  Drink plenty of fluids but you should avoid alcoholic beverages for 24 hours.  ACTIVITY:  You should plan to take it easy for the rest  of today and you should NOT DRIVE or use heavy machinery until tomorrow (because of the sedation medicines used during the test).    FOLLOW UP: Our staff will call the number listed on your records the next business day following your procedure.  We will call around 7:15- 8:00 am to check on you and address any questions or concerns that you may have regarding the information given to you following your procedure. If we do not reach you, we will leave a message.     If any biopsies were taken you will be contacted by phone or by letter within the next 1-3 weeks.  Please call us at [phone] if you have not heard about the biopsies in 3 weeks.    SIGNATURES/CONFIDENTIALITY: You and/or your care partner have signed paperwork which will be entered into your electronic medical record.  These signatures attest to the fact that that the information above on your After Visit Summary has been reviewed and is understood.  Full responsibility of the confidentiality of this discharge information lies with you and/or your care-partner.

## 2023-08-14 NOTE — Progress Notes (Signed)
VS by JD  Pt's states no medical or surgical changes since previsit or office visit.  

## 2023-08-14 NOTE — Progress Notes (Signed)
Sedate, gd SR, tolerated procedure well, VSS, report to RN 

## 2023-08-15 ENCOUNTER — Telehealth: Payer: Self-pay

## 2023-08-15 NOTE — Telephone Encounter (Signed)
FYI

## 2023-08-15 NOTE — Telephone Encounter (Signed)
Follow up call to pt, lm for pt to call if having any difficulty with normal activities or eating and drinking.  Also to call if any other questions or concerns.  

## 2023-08-28 ENCOUNTER — Encounter: Payer: Self-pay | Admitting: Family Medicine

## 2023-08-31 ENCOUNTER — Ambulatory Visit: Payer: Medicare Other

## 2023-08-31 ENCOUNTER — Ambulatory Visit
Admission: EM | Admit: 2023-08-31 | Discharge: 2023-08-31 | Disposition: A | Discharge status: Home / Self Care | Payer: Medicare Other | Attending: Internal Medicine | Admitting: Internal Medicine

## 2023-08-31 DIAGNOSIS — M79671 Pain in right foot: Secondary | ICD-10-CM

## 2023-08-31 MED ORDER — PREDNISONE 20 MG PO TABS: 40.0000 mg | ORAL_TABLET | Freq: Every day | ORAL | 0 refills | Status: AC

## 2023-08-31 NOTE — ED Triage Notes (Signed)
Pt states that he has some right foot pain. X2 weeks

## 2023-08-31 NOTE — Discharge Instructions (Signed)
X-ray was normal.  I have prescribed prednisone to help decrease inflammation associated with your foot pain.  I recommend that you follow-up with your established orthopedist for further evaluation and management.

## 2023-08-31 NOTE — ED Provider Notes (Signed)
[facility]    CSN: [id number] Arrival date & time: 08/31/23  2536      History   Chief Complaint Chief Complaint  Patient presents with   Foot Pain    X2 weeks Right foot pain    HPI DEMETRICK TIREY is a 67 y.o. male.   Patient presents with right foot pain that started about 2 weeks ago.  Reports this is the first time he has been evaluated for this foot pain.  Denies any injury to the area but reports that it started while he was at a concert.  He states that his shoes were not fitting right and so he was turning his foot outward causing lateral foot pain.  He states that the pain has now moved over to the medial portion of the foot.  He has taken ibuprofen and Tylenol with minimal improvement.  Reports numbness to the foot but states this is baseline for him. He has had previous surgery in that foot.    Foot Pain    Past Medical History:  Diagnosis Date   Diverticulitis    Hyperlipidemia    Hypertension    PONV (postoperative nausea and vomiting)    when he was 67 years old    Patient Active Problem List   Diagnosis Date Noted   PVC's (premature ventricular contractions) 07/06/2019   Eczema 04/15/2017   Sinusitis, chronic 07/28/2013   Hypertension 01/26/2012   Hyperlipidemia 01/26/2012    Past Surgical History:  Procedure Laterality Date    bone spur removal     2016- foot   ARTHROTOMY Right 11/15/2015   Procedure: RIGHT ANKLE ARTHROTOMY ;  Surgeon: Marcene Corning, MD;  Location: [facility] OR;  Service: Orthopedics;  Laterality: Right;   balloon septoplasty     COLONOSCOPY  2011   ELECTROMAGNETIC NAVIGATION BROCHOSCOPY  12/20/2016   FOREIGN BODY REMOVAL Right 11/15/2015   Procedure: RIGHT ANKLE LOOSE BODY REMOVAL;  Surgeon: Marcene Corning, MD;  Location: [facility] OR;  Service: Orthopedics;  Laterality: Right;   HERNIA REPAIR     67 years old   LUNG BIOPSY Right 03/07/2017   Procedure: RIGHT LUNG BIOPSY;  Surgeon: Loreli Slot, MD;  Location: [facility] OR;   Service: Thoracic;  Laterality: Right;   LYMPH NODE DISSECTION Right 03/07/2017   Procedure: LYMPH NODE DISSECTION, right lung;  Surgeon: Loreli Slot, MD;  Location: [facility] OR;  Service: Thoracic;  Laterality: Right;   SEGMENTECOMY Right 03/07/2017   Procedure: RIGHT LOWER LOBE SUPERIOR SEGMENTECTOMY;  Surgeon: Loreli Slot, MD;  Location: [facility] OR;  Service: Thoracic;  Laterality: Right;   VIDEO ASSISTED THORACOSCOPY (VATS)/WEDGE RESECTION Right 03/07/2017   Procedure: RIGHT LUNG VIDEO ASSISTED THORACOSCOPY (VATS)/WEDGE RESECTION;  Surgeon: Loreli Slot, MD;  Location: [facility] OR;  Service: Thoracic;  Laterality: Right;       Home Medications    Prior to Admission medications   Medication Sig Start Date End Date Taking? Authorizing Provider  Bromelains 500 MG TABS Take 1 tablet by mouth daily.    Yes [provider]  clobetasol (TEMOVATE) 0.05 % external solution Apply 1 application topically 2 (two) times daily. 7 days maximum. For scalp Patient taking differently: Apply 1 application  topically daily as needed. As needed. 06/21/20  Yes Shelva Majestic, MD  diltiazem (CARDIZEM CD) 120 MG 24 hr capsule TAKE 1 CAPSULE BY MOUTH EVERY DAY 02/12/23  Yes Shelva Majestic, MD  lisinopril (ZESTRIL) 20 MG tablet TAKE 1 TABLET DAILY 01/21/23  Yes Shelva Majestic, MD  mometasone (ELOCON) 0.1 % cream Apply 1 application. topically daily.   Yes [provider]  Multiple Vitamin (MULTIVITAMIN) capsule Take 1 capsule by mouth daily.   Yes [provider]  predniSONE (DELTASONE) 20 MG tablet Take 2 tablets (40 mg total) by mouth daily for 5 days. 08/31/23 09/05/23 Yes Quayshawn Nin, Acie Fredrickson, FNP  ranolazine (RANEXA) 500 MG 12 hr tablet Take 500 mg by mouth 2 (two) times daily. 06/19/23  Yes [provider]  rosuvastatin (CRESTOR) 10 MG tablet TAKE 1 TABLET DAILY 01/21/23  Yes Shelva Majestic, MD  sildenafil (VIAGRA) 100 MG tablet Take 1 tablet (100 mg total) by mouth  daily as needed for erectile dysfunction. 02/04/23  Yes Shelva Majestic, MD  VITAMIN D PO Take by mouth.   Yes [provider]    Family History Family History  Problem Relation Age of Onset   Hypertension Mother        uncontrolled   Stroke Mother        29   Other Mother        died- fell after stroke and broke ribs- "lungs filled with fluid"   Other Father        unknown cause of death age 63   Rheum arthritis Father    Other Brother        some step siblingsonly   Colon cancer Neg Hx    Colon polyps Neg Hx    Esophageal cancer Neg Hx    Rectal cancer Neg Hx    Stomach cancer Neg Hx     Social History Social History   Tobacco Use   Smoking status: Never   Smokeless tobacco: Never  Vaping Use   Vaping status: Never Used  Substance Use Topics   Alcohol use: Yes    Alcohol/week: 8.0 standard drinks of alcohol    Types: 1 Glasses of wine, 7 Standard drinks or equivalent per week    Comment: occ   Drug use: No     Allergies   Levaquin [levofloxacin] and Doxycycline   Review of Systems Review of Systems Per HPI  Physical Exam Triage Vital Signs ED Triage Vitals  Encounter Vitals Group     BP 08/31/23 0959 129/81     Systolic BP Percentile --      Diastolic BP Percentile --      Pulse Rate 08/31/23 0959 73     Resp 08/31/23 0959 16     Temp 08/31/23 0959 97.6 F (36.4 C)     Temp Source 08/31/23 0959 Oral     SpO2 08/31/23 0959 96 %     Weight 08/31/23 0958 222 lb (100.7 kg)     Height 08/31/23 0958 6\' 2"  (1.88 m)     Head Circumference --      Peak Flow --      Pain Score 08/31/23 0958 6     Pain Loc --      Pain Education --      Exclude from Growth Chart --    No data found.  Updated Vital Signs BP 129/81 (BP Location: Left Arm)   Pulse 73   Temp 97.6 F (36.4 C) (Oral)   Resp 16   Ht 6\' 2"  (1.88 m)   Wt 222 lb (100.7 kg)   SpO2 96%   BMI 28.50 kg/m   Visual Acuity Right Eye Distance:   Left Eye Distance:   Bilateral  Distance:  Right Eye Near:   Left Eye Near:    Bilateral Near:     Physical Exam Constitutional:      General: He is not in acute distress.    Appearance: Normal appearance. He is not toxic-appearing or diaphoretic.  HENT:     Head: Normocephalic and atraumatic.  Eyes:     Extraocular Movements: Extraocular movements intact.     Conjunctiva/sclera: Conjunctivae normal.  Pulmonary:     Effort: Pulmonary effort is normal.  Feet:     Comments: Mild tenderness to palpation to proximal dorsal surface of foot overlying first metatarsal.  No obvious swelling or discoloration noted.  Patient wiggle toes.  Capillary refill and pulses intact.  No abrasions or lacerations noted. Neurological:     General: No focal deficit present.     Mental Status: He is alert and oriented to person, place, and time. Mental status is at baseline.  Psychiatric:        Mood and Affect: Mood normal.        Behavior: Behavior normal.        Thought Content: Thought content normal.        Judgment: Judgment normal.      UC Treatments / Results  Labs (all labs ordered are listed, but only abnormal results are displayed) Labs Reviewed - No data to display  EKG   Radiology DG Foot Complete Right  Result Date: 08/31/2023 CLINICAL DATA:  Right foot pain for 2 weeks without known injury. EXAM: RIGHT FOOT COMPLETE - 3+ VIEW COMPARISON:  None Available. FINDINGS: There is no evidence of fracture or dislocation. There is no evidence of arthropathy or other focal bone abnormality. Soft tissues are unremarkable. IMPRESSION: Negative. Electronically Signed   By: Lupita Raider M.D.   On: 08/31/2023 10:39    Procedures Procedures (including critical care time)  Medications Ordered in UC Medications - No data to display  Initial Impression / Assessment and Plan / UC Course  I have reviewed the triage vital signs and the nursing notes.  Pertinent labs & imaging results that were available during my care of  the patient were reviewed by me and considered in my medical decision making (see chart for details).     X-ray was negative for any acute bony abnormality. Differential diagnoses include gout versus muscle inflammation versus muscle strain.  Advised patient that he will need to follow-up with his established orthopedist for further evaluation and management as he may need more advanced imaging.  Also provided patient with podiatry contact information if he is not able to get in with his orthopedist.  Will prescribe prednisone to help decrease inflammation given over-the-counter NSAIDs and Tylenol have not been very helpful.  Patient reports he has taken prednisone previously and tolerated well.  He does have a history of PVCs but reports that this does not flareup when he takes prednisone.  No other contraindications to prednisone noted in patient's history.  Echocardiogram was noted as normal on cardiology notes so this should be safe.  Advised strict follow-up precautions as well.  Patient verbalized understanding and was agreeable with plan. Final Clinical Impressions(s) / UC Diagnoses   Final diagnoses:  Right foot pain     Discharge Instructions      X-ray was normal.  I have prescribed prednisone to help decrease inflammation associated with your foot pain.  I recommend that you follow-up with your established orthopedist for further evaluation and management.     ED Prescriptions  Medication Sig Dispense Auth. Provider   predniSONE (DELTASONE) 20 MG tablet Take 2 tablets (40 mg total) by mouth daily for 5 days. 10 tablet Gustavus Bryant, [place]      PDMP not reviewed this encounter.   Gustavus Bryant, [place] 08/31/23 1105

## 2023-09-19 ENCOUNTER — Other Ambulatory Visit: Payer: Self-pay | Admitting: Family

## 2023-09-19 DIAGNOSIS — R195 Other fecal abnormalities: Secondary | ICD-10-CM

## 2023-09-26 ENCOUNTER — Encounter: Payer: Self-pay | Admitting: Internal Medicine

## 2023-09-26 ENCOUNTER — Ambulatory Visit: Payer: Medicare Other | Admitting: Internal Medicine

## 2023-09-26 VITALS — BP 123/74 | HR 70 | Temp 98.0°F | Ht 74.0 in | Wt 233.6 lb

## 2023-09-26 DIAGNOSIS — J324 Chronic pansinusitis: Secondary | ICD-10-CM

## 2023-09-26 MED ORDER — AMOXICILLIN-POT CLAVULANATE 875-125 MG PO TABS
1.0000 | ORAL_TABLET | Freq: Two times a day (BID) | ORAL | 0 refills | Status: DC
Start: 2023-09-26 — End: 2023-11-26

## 2023-09-26 NOTE — Progress Notes (Addendum)
[facility]: [phone]   -- Medical Office Visit --  Patient:  Ricardo Garcia      Age: 67 y.o.       Sex:  male  Date:   09/26/2023 Patient Care Team: Shelva Majestic, MD as PCP - General (Family Medicine) Loreli Slot, MD as Consulting Physician (Cardiothoracic Surgery) Today's Healthcare Provider: Lula Olszewski, MD      Assessment & Plan Chronic pansinusitis Chronic Sinusitis mostly maxillary with history CT proven infection(s) that resolve with intermittent Augmentin. They report recurrent sinus infections necessitating periodic antibiotic treatment, with chronic congestion that worsens over time, leading to infections relieved by Augmentin. Despite previous sinus surgery of balloon sinuplasty, they have not achieved long-term resolution. We discussed the potential for coblation surgery to improve sinus drainage. We will prescribe Augmentin, which they [facility] [address]. Should current treatment not resolve symptoms, we will consider a referral for a consultation regarding coblation surgery. If symptoms persist after 10 days of Augmentin, we will order a CT sinus scan without the need for an additional appointment.   Diagnoses and all orders for this visit: Chronic pansinusitis -     amoxicillin-clavulanate (AUGMENTIN) 875-125 MG tablet; Take 1 tablet by mouth 2 (two) times daily.  Recommended follow-up: as needed  Future Appointments  Date Time Provider Department Center  02/14/2024  8:20 AM Shelva Majestic, MD [facility]  05/21/2024 11:30 AM [facility] ANNUAL WELLNESS VISIT 1 [facility]            Subjective   67 y.o. male who has Hypertension; Hyperlipidemia; Sinusitis, chronic; Eczema; and PVC's (premature ventricular contractions) on their problem list. His reasons/main concerns/chief complaints for today's office visit are Recurrent Sinusitis (For about three of four weeks.)    ------------------------------------------------------------------------------------------------------------------------ AI-Extracted: Discussed the use of AI scribe software for clinical note transcription with the patient, who gave verbal consent to proceed.  History of Present Illness   The patient, with a history of chronic sinusitis, presents with a 15-year history of progressive nasal congestion. They describe the condition as a slow, gradual worsening over time, likening it to hair growth. The congestion eventually culminates in headaches, fatigue, and infection, necessitating antibiotic treatment. The patient reports that a 10-day course of Augmentin typically resolves the symptoms for approximately six months.  The patient has undergone sinus surgery in the past (balloon sinuplasty), aimed at improving oxygen flow and reducing congestion. Despite these interventions, the patient continues to experience chronic congestion and recurrent sinus infections. They also report a history of tinnitus, which began following an ear infection several years ago.  The patient manages their sinus symptoms with daily steam inhalation and saline nasal rinses. However, they note that the rinses do not seem to reach the maxillary area, which they believe is the primary site of their recurrent infections. Despite these interventions, the patient's symptoms persist, necessitating periodic antibiotic treatment.      He has a past medical history of Diverticulitis, Hyperlipidemia, Hypertension, and PONV (postoperative nausea and vomiting).  Problem list overviews that were updated at today's visit: No problems updated. Current Outpatient Medications on File Prior to Visit  Medication Sig   Bromelains 500 MG TABS Take 1 tablet by mouth daily.    clobetasol (TEMOVATE) 0.05 % external solution Apply 1 application topically 2 (two) times daily. 7 days maximum. For scalp (Patient taking differently: Apply 1  application  topically daily as needed. As needed.)   diltiazem (CARDIZEM CD)  120 MG 24 hr capsule TAKE 1 CAPSULE BY MOUTH EVERY DAY   lisinopril (ZESTRIL) 20 MG tablet TAKE 1 TABLET DAILY   mometasone (ELOCON) 0.1 % cream Apply 1 application. topically daily.   Multiple Vitamin (MULTIVITAMIN) capsule Take 1 capsule by mouth daily.   ranolazine (RANEXA) 500 MG 12 hr tablet Take 500 mg by mouth 2 (two) times daily.   rosuvastatin (CRESTOR) 10 MG tablet TAKE 1 TABLET DAILY   sildenafil (VIAGRA) 100 MG tablet Take 1 tablet (100 mg total) by mouth daily as needed for erectile dysfunction.   VITAMIN D PO Take by mouth.   No current facility-administered medications on file prior to visit.  There are no discontinued medications.   Objective   Physical Exam  BP 123/74 (BP Location: Left Arm, Patient Position: Sitting)   Pulse 70   Temp 98 F (36.7 C) (Temporal)   Ht 6\' 2"  (1.88 m)   Wt 233 lb 9.6 oz (106 kg)   SpO2 96%   BMI 29.99 kg/m  Wt Readings from Last 10 Encounters:  09/26/23 233 lb 9.6 oz (106 kg)  08/31/23 222 lb (100.7 kg)  08/14/23 222 lb (100.7 kg)  08/06/23 227 lb (103 kg)  07/31/23 222 lb (100.7 kg)  05/16/23 238 lb (108 kg)  02/04/23 238 lb 9.6 oz (108.2 kg)  11/23/22 236 lb 3.2 oz (107.1 kg)  06/13/22 235 lb 4 oz (106.7 kg)  01/31/22 237 lb (107.5 kg)   Vital signs reviewed.  Nursing notes reviewed. Weight trend reviewed. Abnormalities and Problem-Specific physical exam findings:  Physical Exam   HEENT: Nasal passageways wide open, not inflamed. Blood on one side, possible ulcer. Superficial rt mid septal wall      General Appearance:  No acute distress appreciable.   Well-groomed, healthy-appearing male.  Well proportioned with no abnormal fat distribution.  Good muscle tone. Pulmonary:  Normal work of breathing at rest, no respiratory distress apparent. SpO2: 96 %  Musculoskeletal: All extremities are intact.  Neurological:  Awake, alert, oriented, and engaged.   No obvious focal neurological deficits or cognitive impairments.  Sensorium seems unclouded.   Speech is clear and coherent with logical content. Psychiatric:  Appropriate mood, pleasant and cooperative demeanor, thoughtful and engaged during the exam  Results   RADIOLOGY CT: Infected maxillary sinuses        No results found for any visits on 09/26/23.  Office Visit on 08/06/2023  Component Date Value   Direct LDL 08/06/2023 110.0    Hgb A1c MFr Bld 08/06/2023 5.8    Sodium 08/06/2023 133 (L)    Potassium 08/06/2023 4.4    Chloride 08/06/2023 96    CO2 08/06/2023 29    Glucose, Bld 08/06/2023 101 (H)    BUN 08/06/2023 18    Creatinine, Ser 08/06/2023 1.26    Total Bilirubin 08/06/2023 0.5    Alkaline Phosphatase 08/06/2023 44    AST 08/06/2023 19    ALT 08/06/2023 18    Total Protein 08/06/2023 7.4    Albumin 08/06/2023 4.5    GFR 08/06/2023 58.83 (L)    Calcium 08/06/2023 10.1   Orders Only on 05/22/2023  Component Date Value   COLOGUARD 06/12/2023 Positive (A)   Office Visit on 02/04/2023  Component Date Value   PSA 02/04/2023 1.07    WBC 02/04/2023 6.7    RBC 02/04/2023 4.82    Hemoglobin 02/04/2023 15.1    HCT 02/04/2023 44.9    MCV 02/04/2023 93.0    MCHC 02/04/2023  33.6    RDW 02/04/2023 13.8    Platelets 02/04/2023 246.0    Neutrophils Relative % 02/04/2023 71.3    Lymphocytes Relative 02/04/2023 13.6    Monocytes Relative 02/04/2023 8.5    Eosinophils Relative 02/04/2023 5.6 (H)    Basophils Relative 02/04/2023 1.0    Neutro Abs 02/04/2023 4.8    Lymphs Abs 02/04/2023 0.9    Monocytes Absolute 02/04/2023 0.6    Eosinophils Absolute 02/04/2023 0.4    Basophils Absolute 02/04/2023 0.1    Sodium 02/04/2023 136    Potassium 02/04/2023 4.5    Chloride 02/04/2023 101    CO2 02/04/2023 27    Glucose, Bld 02/04/2023 113 (H)    BUN 02/04/2023 17    Creatinine, Ser 02/04/2023 1.14    Total Bilirubin 02/04/2023 0.6    Alkaline Phosphatase 02/04/2023 44     AST 02/04/2023 15    ALT 02/04/2023 15    Total Protein 02/04/2023 6.7    Albumin 02/04/2023 4.3    GFR 02/04/2023 66.57    Calcium 02/04/2023 10.0    Cholesterol 02/04/2023 194    Triglycerides 02/04/2023 110.0    HDL 02/04/2023 60.30    VLDL 02/04/2023 22.0    LDL Cholesterol 02/04/2023 111 (H)    Total CHOL/HDL Ratio 02/04/2023 3    NonHDL 02/04/2023 133.28    Hgb A1c MFr Bld 02/04/2023 5.8    No image results found.   DG Foot Complete Right  Result Date: 08/31/2023 CLINICAL DATA:  Right foot pain for 2 weeks without known injury. EXAM: RIGHT FOOT COMPLETE - 3+ VIEW COMPARISON:  None Available. FINDINGS: There is no evidence of fracture or dislocation. There is no evidence of arthropathy or other focal bone abnormality. Soft tissues are unremarkable. IMPRESSION: Negative. Electronically Signed   By: Lupita Raider M.D.   On: 08/31/2023 10:39    DG Foot Complete Right  Result Date: 08/31/2023 CLINICAL DATA:  Right foot pain for 2 weeks without known injury. EXAM: RIGHT FOOT COMPLETE - 3+ VIEW COMPARISON:  None Available. FINDINGS: There is no evidence of fracture or dislocation. There is no evidence of arthropathy or other focal bone abnormality. Soft tissues are unremarkable. IMPRESSION: Negative. Electronically Signed   By: Lupita Raider M.D.   On: 08/31/2023 10:39

## 2023-09-26 NOTE — Patient Instructions (Signed)
VISIT SUMMARY:  During your visit, we discussed your ongoing issues with chronic sinusitis, which is a condition where your sinuses become swollen and inflamed. Despite previous surgeries and treatments, you continue to experience nasal congestion and recurrent sinus infections. You also mentioned a history of tinnitus, a ringing or buzzing noise in your ears, which started after an ear infection several years ago.  YOUR PLAN:  -CHRONIC SINUSITIS: We discussed the possibility of coblation surgery, a procedure that uses radiofrequency energy to remove tissue, to improve your sinus drainage. In the meantime, I have prescribed Augmentin, an antibiotic, which [facility] [address]. If your symptoms do not improve after 10 days of taking Augmentin, we will order a CT scan, a type of X-ray that provides detailed images of your sinuses, without the need for an additional appointment.  INSTRUCTIONS:  Please start taking the prescribed Augmentin as soon as you [facility] [address]. If your symptoms do not improve after 10 days, please contact our office so we can order a CT scan. We will also consider referring you for a consultation regarding coblation surgery if your current treatment does not resolve your symptoms.

## 2023-09-26 NOTE — Assessment & Plan Note (Addendum)
Chronic Sinusitis mostly maxillary with history CT proven infection(s) that resolve with intermittent Augmentin. They report recurrent sinus infections necessitating periodic antibiotic treatment, with chronic congestion that worsens over time, leading to infections relieved by Augmentin. Despite previous sinus surgery of balloon sinuplasty, they have not achieved long-term resolution. We discussed the potential for coblation surgery to improve sinus drainage. We will prescribe Augmentin, which they [facility] [address]. Should current treatment not resolve symptoms, we will consider a referral for a consultation regarding coblation surgery. If symptoms persist after 10 days of Augmentin, we will order a CT sinus scan without the need for an additional appointment.

## 2023-09-27 ENCOUNTER — Encounter: Payer: Self-pay | Admitting: Internal Medicine

## 2023-09-29 ENCOUNTER — Encounter: Payer: Self-pay | Admitting: Internal Medicine

## 2023-09-29 ENCOUNTER — Ambulatory Visit
Admission: EM | Admit: 2023-09-29 | Discharge: 2023-09-29 | Disposition: A | Discharge status: Home / Self Care | Payer: Medicare Other | Attending: Internal Medicine | Admitting: Internal Medicine

## 2023-09-29 DIAGNOSIS — M109 Gout, unspecified: Secondary | ICD-10-CM | POA: Diagnosis not present

## 2023-09-29 NOTE — Discharge Instructions (Addendum)
Your blood work was sent to the lab for further testing. Someone from our office will call you with your results.

## 2023-09-29 NOTE — ED Provider Notes (Signed)
[facility]  Note:  This document was prepared using Dragon voice recognition software and may include unintentional dictation errors.  MRN: [id number] DOB: 10-26-1956 DATE: 09/29/23   Subjective:  Chief Complaint: No chief complaint on file.    HPI: Ricardo Garcia is a 67 y.o. male presenting for pain in his right great toe for the past couple of days. He reports similar episode earlier last month in the same foot, but more proximal. He was seen at the urgent in [place] where he was treated for possible gout flare with prednisone. No blood work was completed at that time. He reports symptoms resolved until recently. He was instructed to follow up with orthopedics or podiatry at that appointment, but they will not see him until he has blood work completed. He presents today for blood work for his specialist. He is having a current flare, but states it is manageable compared to his last episode. He has been taking NSAIDs for his pain. Denies fever, nausea/vomiting, numbness/tingling. Endorses pain in right great toe. Presents NAD.  Prior to Admission medications   Medication Sig Start Date End Date Taking? Authorizing Provider  amoxicillin-clavulanate (AUGMENTIN) 875-125 MG tablet Take 1 tablet by mouth 2 (two) times daily. 09/26/23   Lula Olszewski, MD  Bromelains 500 MG TABS Take 1 tablet by mouth daily.     [provider]  clobetasol (TEMOVATE) 0.05 % external solution Apply 1 application topically 2 (two) times daily. 7 days maximum. For scalp Patient taking differently: Apply 1 application  topically daily as needed. As needed. 06/21/20   Shelva Majestic, MD  diltiazem (CARDIZEM CD) 120 MG 24 hr capsule TAKE 1 CAPSULE BY MOUTH EVERY DAY 02/12/23   Shelva Majestic, MD  lisinopril (ZESTRIL) 20 MG tablet TAKE 1 TABLET DAILY 01/21/23   Shelva Majestic, MD  mometasone (ELOCON) 0.1 % cream Apply 1 application. topically daily.    [provider]  Multiple Vitamin  (MULTIVITAMIN) capsule Take 1 capsule by mouth daily.    [provider]  ranolazine (RANEXA) 500 MG 12 hr tablet Take 500 mg by mouth 2 (two) times daily. 06/19/23   [provider]  rosuvastatin (CRESTOR) 10 MG tablet TAKE 1 TABLET DAILY 01/21/23   Shelva Majestic, MD  sildenafil (VIAGRA) 100 MG tablet Take 1 tablet (100 mg total) by mouth daily as needed for erectile dysfunction. 02/04/23   Shelva Majestic, MD  VITAMIN D PO Take by mouth.    [provider]     Allergies  Allergen Reactions   Levaquin [Levofloxacin] Other (See Comments)    MYALGIAS.Marland KitchenMarland KitchenNOTED AFTER TREATMENT 10/2016   Doxycycline     Worsening tinnitus    History:   Past Medical History:  Diagnosis Date   Diverticulitis    Hyperlipidemia    Hypertension    PONV (postoperative nausea and vomiting)    when he was 67 years old     Past Surgical History:  Procedure Laterality Date    bone spur removal     2016- foot   ARTHROTOMY Right 11/15/2015   Procedure: RIGHT ANKLE ARTHROTOMY ;  Surgeon: Marcene Corning, MD;  Location: [facility] OR;  Service: Orthopedics;  Laterality: Right;   balloon septoplasty     COLONOSCOPY  2011   ELECTROMAGNETIC NAVIGATION BROCHOSCOPY  12/20/2016   FOREIGN BODY REMOVAL Right 11/15/2015   Procedure: RIGHT ANKLE LOOSE BODY REMOVAL;  Surgeon: Marcene Corning, MD;  Location: [facility] OR;  Service: Orthopedics;  Laterality:  Right;   HERNIA REPAIR     67 years old   LUNG BIOPSY Right 03/07/2017   Procedure: RIGHT LUNG BIOPSY;  Surgeon: Loreli Slot, MD;  Location: [facility] OR;  Service: Thoracic;  Laterality: Right;   LYMPH NODE DISSECTION Right 03/07/2017   Procedure: LYMPH NODE DISSECTION, right lung;  Surgeon: Loreli Slot, MD;  Location: [facility] OR;  Service: Thoracic;  Laterality: Right;   SEGMENTECOMY Right 03/07/2017   Procedure: RIGHT LOWER LOBE SUPERIOR SEGMENTECTOMY;  Surgeon: Loreli Slot, MD;  Location: [facility] OR;  Service: Thoracic;  Laterality: Right;    VIDEO ASSISTED THORACOSCOPY (VATS)/WEDGE RESECTION Right 03/07/2017   Procedure: RIGHT LUNG VIDEO ASSISTED THORACOSCOPY (VATS)/WEDGE RESECTION;  Surgeon: Loreli Slot, MD;  Location: [facility] OR;  Service: Thoracic;  Laterality: Right;    Family History  Problem Relation Age of Onset   Hypertension Mother        uncontrolled   Stroke Mother        8   Other Mother        died- fell after stroke and broke ribs- "lungs filled with fluid"   Other Father        unknown cause of death age 32   Rheum arthritis Father    Other Brother        some step siblingsonly   Colon cancer Neg Hx    Colon polyps Neg Hx    Esophageal cancer Neg Hx    Rectal cancer Neg Hx    Stomach cancer Neg Hx     Social History   Tobacco Use   Smoking status: Never   Smokeless tobacco: Never  Vaping Use   Vaping status: Never Used  Substance Use Topics   Alcohol use: Yes    Alcohol/week: 8.0 standard drinks of alcohol    Types: 1 Glasses of wine, 7 Standard drinks or equivalent per week    Comment: occ   Drug use: No    Review of Systems  Constitutional:  Negative for fever.  Gastrointestinal:  Negative for nausea and vomiting.  Musculoskeletal:  Positive for arthralgias.     Objective:   Vitals: There were no vitals taken for this visit.  Physical Exam Constitutional:      General: He is not in acute distress.    Appearance: Normal appearance. He is well-developed and normal weight. He is not ill-appearing or toxic-appearing.  HENT:     Head: Normocephalic and atraumatic.  Cardiovascular:     Pulses:          Dorsalis pedis pulses are 2+ on the right side and 2+ on the left side.  Pulmonary:     Effort: Pulmonary effort is normal.     Breath sounds: Normal breath sounds.  Musculoskeletal:     Right foot: Tenderness present. Normal pulse.     Left foot: Normal.     Comments: TTP of right first MTP. Overlying erythema with slight warmth. No discharge. Full ROM. NV intact.   Skin:     General: Skin is warm and dry.     Findings: Erythema present.  Neurological:     General: No focal deficit present.     Mental Status: He is alert.  Psychiatric:        Mood and Affect: Mood and affect normal.     Results:  Labs: No results found for this or any previous visit (from the past 24 hour(s)).  Radiology: No results found.   UC Course/Treatments:  Procedures: Procedures   Medications Ordered in UC: Medications - No data to display   Assessment and Plan :     ICD-10-CM   1. 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 VSS. DDX includes but not limited to: gout, pseudogout, cellulitis Suspect gout given location and recurrent flares. CMP and uric acid were ordered for specialist. Recommend OTC analgesics as needed for pain. Strict ED precautions were given and patient verbalized understanding.   ED Discharge Orders     None        PDMP not reviewed this encounter.     Cynda Acres, PA-C 09/29/23 1415

## 2023-09-29 NOTE — ED Triage Notes (Signed)
Pt here for blood work only.

## 2023-10-01 LAB — COMPREHENSIVE METABOLIC PANEL
ALT: 18 [IU]/L (ref 0–44)
AST: 21 [IU]/L (ref 0–40)
Albumin: 4.5 g/dL (ref 3.9–4.9)
Alkaline Phosphatase: 62 [IU]/L (ref 44–121)
BUN/Creatinine Ratio: 15 (ref 10–24)
BUN: 17 mg/dL (ref 8–27)
Bilirubin Total: 0.3 mg/dL (ref 0.0–1.2)
CO2: 23 mmol/L (ref 20–29)
Calcium: 10 mg/dL (ref 8.6–10.2)
Chloride: 102 mmol/L (ref 96–106)
Creatinine, Ser: 1.15 mg/dL (ref 0.76–1.27)
Globulin, Total: 2.5 g/dL (ref 1.5–4.5)
Glucose: 120 mg/dL — ABNORMAL HIGH (ref 70–99)
Potassium: 4.5 mmol/L (ref 3.5–5.2)
Sodium: 139 mmol/L (ref 134–144)
Total Protein: 7 g/dL (ref 6.0–8.5)
eGFR: 69 mL/min/{1.73_m2} (ref >59)

## 2023-10-01 LAB — URIC ACID: Uric Acid: 7.4 mg/dL (ref 3.8–8.4)

## 2023-11-25 ENCOUNTER — Encounter: Payer: Self-pay | Admitting: Family Medicine

## 2023-11-25 NOTE — Telephone Encounter (Signed)
Pt has been scheduled for 11/26/23 @ 3:20 pm w/ PCP.

## 2023-11-26 ENCOUNTER — Encounter: Payer: Self-pay | Admitting: Family Medicine

## 2023-11-26 ENCOUNTER — Ambulatory Visit: Payer: Medicare Other | Admitting: Family Medicine

## 2023-11-26 VITALS — BP 136/82 | HR 64 | Temp 97.7°F | Ht 74.0 in | Wt 235.0 lb

## 2023-11-26 DIAGNOSIS — M1A09X0 Idiopathic chronic gout, multiple sites, without tophus (tophi): Secondary | ICD-10-CM

## 2023-11-26 DIAGNOSIS — I10 Essential (primary) hypertension: Secondary | ICD-10-CM | POA: Diagnosis not present

## 2023-11-26 DIAGNOSIS — M109 Gout, unspecified: Secondary | ICD-10-CM | POA: Insufficient documentation

## 2023-11-26 MED ORDER — ALLOPURINOL 100 MG PO TABS: 100.0000 mg | ORAL_TABLET | Freq: Every day | ORAL | 11 refills | Status: DC

## 2023-11-26 MED ORDER — PREDNISONE 20 MG PO TABS: ORAL_TABLET | ORAL | 1 refills | Status: DC

## 2023-11-26 NOTE — Assessment & Plan Note (Signed)
S: Seen at urgent care 08/31/23 with right foot pain. X-rays were negative for fracture or dislocationand thought possible gout vs. Muscle strain as he reported ill fitting shoes that were causing him to turn his foot outward causing lateral foot pain. Recommend follow up with orthopedics per urgent care note. NSAIDs and tylenol had not been helpful. Prednisone was given.   Seen again at urgent care 09/29/23 with right great toe pain that had recurred. Thought related to gout - OTC (available over the counter without a prescription) analgesics given as needed for pain  He sent me the following message on 11/25/23  "For the past few years, I have been experiencing intermittent foot problems, resulting in redness, swelling and pain lasting for more than a week at a time. A couple years ago, after attending a convention, the pain was so bad I ended up in a wheelchair in the airport. Then, I went to [employer] to see if I had a stress fracture or some other structural issue and x-rays were all negative.   This has continued to happen randomly and not always in the same foot. I had the foot x-rayed at [facility] in [facility] on September 7th and x-rays were negative, although there was a cloudy area indicating inflammation.   On October 6th, after another flare, I had a blood draw to check uric acid levels and was measured at 7.4 but this was at the tail end of the flare-up and if it was gout, the count would logically have been higher a few days earlier. Normal range in the test was listed at 3.8 - 8.4, but levels above are documented to have caused gout in some people. I'm just low recovering from another flare-up that has lasted a week so it has been 3 times in the last 90 days. The two UC visits are documented in my chart.    Do you get involved with treating gout? I'm trying o figure out what the next step should be. I'm wondering if I need to get started on colchicine or something to see  if this stops these very painful flares. There is no medicine to "treat" a gout flare, only preventive meds to help control uric levels."  Today reports episodes typically 7-10 days. Not clear on food triggers. Often in either of his ankles Lab Results  Component Value Date   LABURIC 7.4 09/29/2023   A/P: symptoms certainly sound like recurrent gout flares and 2 separate clinicians have suspected so. Uric acid above goal of 6 or less with gout -start allopurinol 100 mg daily  - strongly considered colchicine but strong interactions with ranexa and diltiazem that he uses for PVC's- we opted for alternate path but he would be willing to possibly stop these if we do not see progress with following plan - for gout flare prophylaxis- For first 2-4 weeks take naproxen 250 mg twice daily or just aleve 220 mg twice daily while starting allopurinol. You can start back if any twinges of pain when you come off of the naproxen- or we could trial short term low dose prednisone as well or I could send in prednisone for flares- went ahead and sent in a dose -advised lower purine diet.

## 2023-11-26 NOTE — Progress Notes (Signed)
Phone [phone] In person visit   Subjective:   Ricardo Garcia is a 67 y.o. year old very pleasant male patient who presents for/with See problem oriented charting Chief Complaint  Patient presents with   Gout    Past Medical History-  Patient Active Problem List   Diagnosis Date Noted   PVC's (premature ventricular contractions) 07/06/2019    Priority: Medium    Eczema 04/15/2017    Priority: Medium    Sinusitis, chronic 07/28/2013    Priority: Medium    Hypertension 01/26/2012    Priority: Medium    Hyperlipidemia 01/26/2012    Priority: Medium    Gout 11/26/2023    Medications- reviewed and updated Current Outpatient Medications  Medication Sig Dispense Refill   allopurinol (ZYLOPRIM) 100 MG tablet Take 1 tablet (100 mg total) by mouth daily. 30 tablet 11   Bromelains 500 MG TABS Take 1 tablet by mouth daily.      clobetasol (TEMOVATE) 0.05 % external solution Apply 1 application topically 2 (two) times daily. 7 days maximum. For scalp (Patient taking differently: Apply 1 application  topically daily as needed. As needed.) 50 mL 2   diltiazem (CARDIZEM CD) 120 MG 24 hr capsule TAKE 1 CAPSULE BY MOUTH EVERY DAY 90 capsule 3   lisinopril (ZESTRIL) 20 MG tablet TAKE 1 TABLET DAILY 90 tablet 3   mometasone (ELOCON) 0.1 % cream Apply 1 application. topically daily.     Multiple Vitamin (MULTIVITAMIN) capsule Take 1 capsule by mouth daily.     predniSONE (DELTASONE) 20 MG tablet Take 2 pills for 3 days, 1 pill for 4 days for gout flare 10 tablet 1   ranolazine (RANEXA) 500 MG 12 hr tablet Take 500 mg by mouth 2 (two) times daily.     rosuvastatin (CRESTOR) 10 MG tablet TAKE 1 TABLET DAILY 90 tablet 3   sildenafil (VIAGRA) 100 MG tablet Take 1 tablet (100 mg total) by mouth daily as needed for erectile dysfunction. 30 tablet 5   VITAMIN D PO Take by mouth.     No current facility-administered medications for this visit.     Objective:  BP 136/82   Pulse 64   Temp 97.7  F (36.5 C)   Ht 6\' 2"  (1.88 m)   Wt 235 lb (106.6 kg)   SpO2 97%   BMI 30.17 kg/m  Gen: NAD, resting comfortably CV: RRR no murmurs rubs or gallops Lungs: CTAB no crackles, wheeze, rhonchi Ext: no edema Skin: warm, dry No active swelling/pain/redness at site of prior reported gout flare on right foot    Assessment and Plan   #Gout S: Seen at urgent care 08/31/23 with right foot pain. X-rays were negative for fracture or dislocationand thought possible gout vs. Muscle strain as he reported ill fitting shoes that were causing him to turn his foot outward causing lateral foot pain. Recommend follow up with orthopedics per urgent care note. NSAIDs and tylenol had not been helpful. Prednisone was given.   Seen again at urgent care 09/29/23 with right great toe pain that had recurred. Thought related to gout - OTC (available over the counter without a prescription) analgesics given as needed for pain  He sent me the following message on 11/25/23  "For the past few years, I have been experiencing intermittent foot problems, resulting in redness, swelling and pain lasting for more than a week at a time. A couple years ago, after attending a convention, the pain was so bad I ended up  in a wheelchair in the airport. Then, I went to [employer] to see if I had a stress fracture or some other structural issue and x-rays were all negative.   This has continued to happen randomly and not always in the same foot. I had the foot x-rayed at [facility] in [facility] on September 7th and x-rays were negative, although there was a cloudy area indicating inflammation.   On October 6th, after another flare, I had a blood draw to check uric acid levels and was measured at 7.4 but this was at the tail end of the flare-up and if it was gout, the count would logically have been higher a few days earlier. Normal range in the test was listed at 3.8 - 8.4, but levels above are documented to have caused  gout in some people. I'm just low recovering from another flare-up that has lasted a week so it has been 3 times in the last 90 days. The two UC visits are documented in my chart.    Do you get involved with treating gout? I'm trying o figure out what the next step should be. I'm wondering if I need to get started on colchicine or something to see if this stops these very painful flares. There is no medicine to "treat" a gout flare, only preventive meds to help control uric levels."  Today reports episodes typically 7-10 days. Not clear on food triggers. Often in either of his ankles Lab Results  Component Value Date   LABURIC 7.4 09/29/2023   A/P: symptoms certainly sound like recurrent gout flares and 2 separate clinicians have suspected so. Uric acid above goal of 6 or less with gout -start allopurinol 100 mg daily  - strongly considered colchicine but strong interactions with ranexa and diltiazem that he uses for PVC's- we opted for alternate path but he would be willing to possibly stop these if we do not see progress with following plan - for gout flare prophylaxis- For first 2-4 weeks take naproxen 250 mg twice daily or just aleve 220 mg twice daily while starting allopurinol. You can start back if any twinges of pain when you come off of the naproxen- or we could trial short term low dose prednisone as well or I could send in prednisone for flares- went ahead and sent in a dose -advised lower purine diet.   #hypertension/PVC's-  [facility]  S: medication: diltiazem 120 mg XR (generally controls PACs/PVC's) but in 2024 ranexa started by [facility] and helpful, lisinopril 20 mg BP Readings from Last 3 Encounters:  11/26/23 136/82  09/26/23 123/74  08/31/23 129/81  A/P: blood pressure higher than usual but reports 128/78 at home- continue current medications   Recommended follow up: Return for next already scheduled visit or sooner if needed. Future Appointments  Date Time Provider  Department Center  02/20/2024  9:00 AM Shelva Majestic, MD [facility]  05/21/2024 11:30 AM [facility] ANNUAL WELLNESS VISIT 1 [facility]    Lab/Order associations:   ICD-10-CM   1. 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 Primary hypertension  I10       Meds ordered this encounter  Medications   allopurinol (ZYLOPRIM) 100 MG tablet    Sig: Take 1 tablet (100 mg total) by mouth daily.    Dispense:  30 tablet    Refill:  11   predniSONE (DELTASONE) 20 MG tablet  Sig: Take 2 pills for 3 days, 1 pill for 4 days for gout flare    Dispense:  10 tablet    Refill:  1    Return precautions advised.  Tana Conch, MD

## 2023-11-26 NOTE — Patient Instructions (Addendum)
For first 2-4 weeks take naproxen 250 mg twice daily or just aleve 220 mg twice daily while starting allopurinol. You can start back if any twinges of pain when you come off of the naproxen- or we could trial short term low dose prednisone as well or I could send in prednisone for flares- went ahead and sent in a dose  Try to do lower purine diet as per handout  We also could consider changes to cardiac medications - the ranexa and diltiazem are barriers to colchicine  Recommended follow up: Return for next already scheduled visit or sooner if needed.

## 2023-11-30 ENCOUNTER — Other Ambulatory Visit: Payer: Self-pay | Admitting: Family Medicine

## 2024-01-19 ENCOUNTER — Telehealth: Payer: Self-pay | Admitting: Internal Medicine

## 2024-01-19 MED ORDER — PREDNISONE 20 MG PO TABS: ORAL_TABLET | ORAL | 1 refills | Status: DC

## 2024-01-19 NOTE — Telephone Encounter (Signed)
Patient recently diagnosed with the flu. States he used steroid pack that was prescribed for gout flares for symptoms and is concerned that he will not have a steroid pack if needed in the future for gout flare. Requesting refill of steroid. RX sent.

## 2024-01-23 ENCOUNTER — Ambulatory Visit
Admission: EM | Admit: 2024-01-23 | Discharge: 2024-01-23 | Disposition: A | Discharge status: Home / Self Care | Payer: Medicare Other | Attending: Internal Medicine | Admitting: Internal Medicine

## 2024-01-23 ENCOUNTER — Ambulatory Visit: Payer: Medicare Other

## 2024-01-23 DIAGNOSIS — J209 Acute bronchitis, unspecified: Secondary | ICD-10-CM

## 2024-01-23 DIAGNOSIS — J0110 Acute frontal sinusitis, unspecified: Secondary | ICD-10-CM | POA: Diagnosis not present

## 2024-01-23 MED ORDER — AZITHROMYCIN 250 MG PO TABS: ORAL_TABLET | ORAL | 0 refills | Status: DC

## 2024-01-23 MED ORDER — AMOXICILLIN-POT CLAVULANATE 875-125 MG PO TABS
1.0000 | ORAL_TABLET | Freq: Two times a day (BID) | ORAL | 0 refills | Status: AC
Start: 2024-01-23 — End: 2024-01-30

## 2024-01-23 MED ORDER — ALBUTEROL SULFATE (2.5 MG/3ML) 0.083% IN NEBU
2.5000 mg | INHALATION_SOLUTION | Freq: Once | RESPIRATORY_TRACT | Status: AC
  Administered 2024-01-23: 2.5 mg via RESPIRATORY_TRACT

## 2024-01-23 MED ORDER — ALBUTEROL SULFATE HFA 108 (90 BASE) MCG/ACT IN AERS
2.0000 | INHALATION_SPRAY | Freq: Four times a day (QID) | RESPIRATORY_TRACT | 0 refills | Status: AC | PRN
Start: 2024-01-23 — End: ?

## 2024-01-23 NOTE — Discharge Instructions (Addendum)
A prescription was sent for Augmentin and Zithromax. These are antibiotics used to treat upper respiratory infections. Take as directed. Given recent viral infection, concerned for developing pneumonia. I want to cover you for both typical and atypical infections. I have also sent an inhaler to use for any wheezing or shortness of breath.  Return in 3-4 days if no improvement. It is very important for you to pay attention to any new symptoms or worsening of your current condition. Please go directly to the Emergency Department immediately should you begin to have any of the following symptoms: shortness of breath, chest pain or difficulty breathing.

## 2024-01-23 NOTE — ED Triage Notes (Signed)
Patient presents to UC for sinus and chest congestion x 8 days. Dx with the flu last week. States no improvement. Treating symptoms with prednisone. Last known fever 4 days ago.   Denies SOB.

## 2024-01-23 NOTE — ED Provider Notes (Signed)
[facility]  Note:  This document was prepared using Dragon voice recognition software and may include unintentional dictation errors.  MRN: [id number] DOB: 05/22/1956 DATE: 01/23/24   Subjective:  Chief Complaint:  Chief Complaint  Patient presents with   Nasal Congestion     HPI: Ricardo Garcia is a 68 y.o. male presenting for productive cough and congestion for the past 8 days. Patient states he was diagnosed with the flu last week. Reports ongoing fevers, fatigue, and myalgias during that time. He states the fevers and myalgias have resolved; however, he is now having a worsening cough and congestion. Reports productive cough with discolored mucus as well as increased facial pain and headaches. He has been taking Prednisone that he had for left over with some improvement. His last known fever was 4 days ago. Denies nausea/vomiting, abdominal pain, sore throat, otalgia. Endorses cough, congestion, fatigue, facial pain. Presents NAD.  Prior to Admission medications   Medication Sig Start Date End Date Taking? Authorizing Provider  allopurinol (ZYLOPRIM) 100 MG tablet Take 1 tablet (100 mg total) by mouth daily. 11/26/23   Shelva Majestic, MD  Bromelains 500 MG TABS Take 1 tablet by mouth daily.     [provider]  clobetasol (TEMOVATE) 0.05 % external solution Apply 1 application topically 2 (two) times daily. 7 days maximum. For scalp Patient taking differently: Apply 1 application  topically daily as needed. As needed. 06/21/20   Shelva Majestic, MD  diltiazem (CARDIZEM CD) 120 MG 24 hr capsule TAKE 1 CAPSULE BY MOUTH EVERY DAY 02/12/23   Shelva Majestic, MD  lisinopril (ZESTRIL) 20 MG tablet TAKE 1 TABLET DAILY 12/02/23   Shelva Majestic, MD  mometasone (ELOCON) 0.1 % cream Apply 1 application. topically daily.    [provider]  Multiple Vitamin (MULTIVITAMIN) capsule Take 1 capsule by mouth daily.    [provider]  predniSONE (DELTASONE) 20  MG tablet Take 2 pills for 3 days, 1 pill for 4 days for gout flare 01/19/24   Lua Feng P, PA-C  ranolazine (RANEXA) 500 MG 12 hr tablet Take 500 mg by mouth 2 (two) times daily. 06/19/23   [provider]  rosuvastatin (CRESTOR) 10 MG tablet TAKE 1 TABLET DAILY 12/02/23   Shelva Majestic, MD  sildenafil (VIAGRA) 100 MG tablet Take 1 tablet (100 mg total) by mouth daily as needed for erectile dysfunction. 02/04/23   Shelva Majestic, MD  VITAMIN D PO Take by mouth.    [provider]     Allergies  Allergen Reactions   Levaquin [Levofloxacin] Other (See Comments)    MYALGIAS.Marland KitchenMarland KitchenNOTED AFTER TREATMENT 10/2016   Doxycycline     Worsening tinnitus    History:   Past Medical History:  Diagnosis Date   Diverticulitis    Hyperlipidemia    Hypertension    PONV (postoperative nausea and vomiting)    when he was 68 years old     Past Surgical History:  Procedure Laterality Date    bone spur removal     2016- foot   ARTHROTOMY Right 11/15/2015   Procedure: RIGHT ANKLE ARTHROTOMY ;  Surgeon: Marcene Corning, MD;  Location: [facility] OR;  Service: Orthopedics;  Laterality: Right;   balloon septoplasty     COLONOSCOPY  2011   ELECTROMAGNETIC NAVIGATION BROCHOSCOPY  12/20/2016   FOREIGN BODY REMOVAL Right 11/15/2015   Procedure: RIGHT ANKLE LOOSE BODY REMOVAL;  Surgeon: Marcene Corning, MD;  Location: [facility] OR;  Service:  Orthopedics;  Laterality: Right;   HERNIA REPAIR     68 years old   LUNG BIOPSY Right 03/07/2017   Procedure: RIGHT LUNG BIOPSY;  Surgeon: Loreli Slot, MD;  Location: [facility] OR;  Service: Thoracic;  Laterality: Right;   LYMPH NODE DISSECTION Right 03/07/2017   Procedure: LYMPH NODE DISSECTION, right lung;  Surgeon: Loreli Slot, MD;  Location: [facility] OR;  Service: Thoracic;  Laterality: Right;   SEGMENTECOMY Right 03/07/2017   Procedure: RIGHT LOWER LOBE SUPERIOR SEGMENTECTOMY;  Surgeon: Loreli Slot, MD;  Location: [facility] OR;  Service: Thoracic;   Laterality: Right;   VIDEO ASSISTED THORACOSCOPY (VATS)/WEDGE RESECTION Right 03/07/2017   Procedure: RIGHT LUNG VIDEO ASSISTED THORACOSCOPY (VATS)/WEDGE RESECTION;  Surgeon: Loreli Slot, MD;  Location: [facility] OR;  Service: Thoracic;  Laterality: Right;    Family History  Problem Relation Age of Onset   Hypertension Mother        uncontrolled   Stroke Mother        61   Other Mother        died- fell after stroke and broke ribs- "lungs filled with fluid"   Other Father        unknown cause of death age 38   Rheum arthritis Father    Other Brother        some step siblingsonly   Colon cancer Neg Hx    Colon polyps Neg Hx    Esophageal cancer Neg Hx    Rectal cancer Neg Hx    Stomach cancer Neg Hx     Social History   Tobacco Use   Smoking status: Never   Smokeless tobacco: Never  Vaping Use   Vaping status: Never Used  Substance Use Topics   Alcohol use: Yes    Alcohol/week: 8.0 standard drinks of alcohol    Types: 1 Glasses of wine, 7 Standard drinks or equivalent per week    Comment: occ   Drug use: No    Review of Systems  Constitutional:  Positive for fatigue. Negative for fever.  HENT:  Positive for congestion, rhinorrhea, sinus pressure, sinus pain and sore throat. Negative for ear pain.   Respiratory:  Positive for cough.   Gastrointestinal:  Positive for nausea. Negative for abdominal pain and vomiting.  Musculoskeletal:  Negative for myalgias.  Neurological:  Positive for headaches.     Objective:   Vitals: BP (!) 120/56 (BP Location: Right Arm)   Pulse 74   Temp 98.2 F (36.8 C) (Oral)   Resp 16   SpO2 96%   Physical Exam Constitutional:      General: He is not in acute distress.    Appearance: Normal appearance. He is well-developed and normal weight. He is not ill-appearing or toxic-appearing.  HENT:     Head: Normocephalic and atraumatic.     Right Ear: Tympanic membrane and ear canal normal.     Left Ear: Tympanic membrane and ear  canal normal.     Nose: Rhinorrhea present. Rhinorrhea is clear.     Right Sinus: Frontal sinus tenderness present.     Left Sinus: Frontal sinus tenderness present.     Mouth/Throat:     Pharynx: Oropharynx is clear. Uvula midline. No pharyngeal swelling, oropharyngeal exudate or posterior oropharyngeal erythema.     Tonsils: No tonsillar exudate or tonsillar abscesses.  Cardiovascular:     Rate and Rhythm: Normal rate and regular rhythm.     Heart sounds: Normal heart sounds.  Pulmonary:  Effort: Pulmonary effort is normal.     Breath sounds: Examination of the right-middle field reveals rhonchi. Examination of the left-middle field reveals rhonchi. Examination of the right-lower field reveals rhonchi. Examination of the left-lower field reveals rhonchi. Rhonchi present.     Comments: Breath sounds clear after breathing treatment. Abdominal:     General: Bowel sounds are normal.     Palpations: Abdomen is soft.     Tenderness: There is no abdominal tenderness.  Skin:    General: Skin is warm and dry.  Neurological:     General: No focal deficit present.     Mental Status: He is alert.  Psychiatric:        Mood and Affect: Mood and affect normal.     Results:  Labs: No results found for this or any previous visit (from the past 24 hours).  Radiology: No results found.   UC Course/Treatments:  Procedures: Procedures   Medications Ordered in UC: Medications  albuterol (PROVENTIL) (2.5 MG/3ML) 0.083% nebulizer solution 2.5 mg (has no administration in time range)     Assessment and Plan :     ICD-10-CM   1. Acute bronchitis, unspecified organism  J20.9 DG Chest 2 View    DG Chest 2 View    2. Acute non-recurrent frontal sinusitis  J01.10      Acute bronchitis, unspecified organism Afebrile, nontoxic-appearing, NAD. VSS. DDX includes but not limited to: COVID, flu, bronchitis, pneumonia, viral URI Given recent flu and worsening of symptoms, Augmentin 875mg  BID  and Zithromax 250mg  as directed was prescribed for both atypical and typical bacterial infections. CXR was negative for infiltrate, but concerned for developing pneumonia. Breath sounds and oxygen saturation improved with breathing treatment. Albuterol inhaler 2 puffs every 6 hours PRN for shortness of breath or wheezing. Strict ED precautions were given and patient verbalized understanding.  Acute Non-Recurrent Frontal Sinusitis Afebrile, nontoxic-appearing, NAD. VSS. DDX includes but not limited to: COVID, flu, viral URI, sinusitis, allergic rhinitis  Given recent flu and worsening of symptoms, Augmentin 875mg  BID and Zithromax 250mg  as directed was prescribed for both atypical and typical bacterial infections. Strict ED precautions were given and patient verbalized understanding.  ED Discharge Orders          Ordered    amoxicillin-clavulanate (AUGMENTIN) 875-125 MG tablet  Every 12 hours        01/23/24 1027    azithromycin (ZITHROMAX Z-PAK) 250 MG tablet        01/23/24 1027    albuterol (VENTOLIN HFA) 108 (90 Base) MCG/ACT inhaler  Every 6 hours PRN        01/23/24 1028             PDMP not reviewed this encounter.     Treyten Monestime P, PA-C 01/23/24 1034

## 2024-02-06 ENCOUNTER — Other Ambulatory Visit: Payer: Self-pay

## 2024-02-06 ENCOUNTER — Telehealth: Payer: Self-pay | Admitting: Internal Medicine

## 2024-02-06 ENCOUNTER — Ambulatory Visit
Admission: EM | Admit: 2024-02-06 | Discharge: 2024-02-06 | Disposition: A | Discharge status: Home / Self Care | Payer: Medicare Other | Attending: Internal Medicine | Admitting: Internal Medicine

## 2024-02-06 ENCOUNTER — Ambulatory Visit: Payer: Medicare Other

## 2024-02-06 DIAGNOSIS — R052 Subacute cough: Secondary | ICD-10-CM | POA: Diagnosis not present

## 2024-02-06 DIAGNOSIS — Z8709 Personal history of other diseases of the respiratory system: Secondary | ICD-10-CM

## 2024-02-06 MED ORDER — BUDESONIDE-FORMOTEROL FUMARATE 160-4.5 MCG/ACT IN AERO
2.0000 | INHALATION_SPRAY | Freq: Two times a day (BID) | RESPIRATORY_TRACT | 1 refills | Status: AC
Start: 2024-02-06 — End: ?

## 2024-02-06 MED ORDER — BENZONATATE 200 MG PO CAPS
200.0000 mg | ORAL_CAPSULE | Freq: Three times a day (TID) | ORAL | 0 refills | Status: DC | PRN
Start: 2024-02-06 — End: 2024-02-20

## 2024-02-06 NOTE — ED Provider Notes (Signed)
[facility]  Note:  This document was prepared using Dragon voice recognition software and may include unintentional dictation errors.  MRN: [id number] DOB: Apr 28, 1956 DATE: 02/06/24   Subjective:  Chief Complaint: No chief complaint on file.   HPI: Ricardo Garcia is a 68 y.o. male presenting for productive cough for the past 3 weeks. Patient was diagnosed with the flu around the third week of January 2025. At that time, he was treating himself with OTC medications at home and took a Prednisone pack that he had for acute gout flares. He was in seen in our office on 01/23/2024 for worsening cough and congestion at that time. Imaging was negative at that time. He was diagnosed with bronchitis and started on Augmentin and Zithromax given his allergies as well as co-morbidities. He was also started on an albuterol inhaler after improvement in office with a breathing treatment. He presents today for ongoing productive cough for the past 3 weeks. He states that he continues to have a productive cough that is worse in the mornings and with exertion. He states his congestion and facial pain has improved, but he is still coughing. He reports coughing fits throughout the day when exercising. He has been using his albuterol inhaler with little relief. 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 Denies fever, nausea/vomiting, abdominal pain, sore throat, otalgia. Endorses productive cough. Presents NAD.  Prior to Admission medications   Medication Sig Start Date End Date Taking? Authorizing Provider  albuterol (VENTOLIN HFA) 108 (90 Base) MCG/ACT inhaler Inhale 2 puffs into the lungs every 6 (six) hours as needed for wheezing or shortness of breath. 01/23/24   Aleyza Salmi P, PA-C  allopurinol (ZYLOPRIM) 100 MG tablet Take 1 tablet (100 mg total) by mouth daily. 11/26/23   Shelva Majestic, MD  Bromelains 500 MG TABS Take 1 tablet by mouth  daily.     [provider]  clobetasol (TEMOVATE) 0.05 % external solution Apply 1 application topically 2 (two) times daily. 7 days maximum. For scalp Patient taking differently: Apply 1 application  topically daily as needed. As needed. 06/21/20   Shelva Majestic, MD  diltiazem (CARDIZEM CD) 120 MG 24 hr capsule TAKE 1 CAPSULE BY MOUTH EVERY DAY 02/12/23   Shelva Majestic, MD  lisinopril (ZESTRIL) 20 MG tablet TAKE 1 TABLET DAILY 12/02/23   Shelva Majestic, MD  mometasone (ELOCON) 0.1 % cream Apply 1 application. topically daily.    [provider]  Multiple Vitamin (MULTIVITAMIN) capsule Take 1 capsule by mouth daily.    [provider]  ranolazine (RANEXA) 500 MG 12 hr tablet Take 500 mg by mouth 2 (two) times daily. 06/19/23   [provider]  rosuvastatin (CRESTOR) 10 MG tablet TAKE 1 TABLET DAILY 12/02/23   Shelva Majestic, MD  sildenafil (VIAGRA) 100 MG tablet Take 1 tablet (100 mg total) by mouth daily as needed for erectile dysfunction. 02/04/23   Shelva Majestic, MD     Allergies  Allergen Reactions   Levaquin [Levofloxacin] Other (See Comments)    MYALGIAS.Marland KitchenMarland KitchenNOTED AFTER TREATMENT 10/2016   Doxycycline     Worsening tinnitus    History:   Past Medical History:  Diagnosis Date   Diverticulitis    Hyperlipidemia    Hypertension    PONV (postoperative nausea and vomiting)    when he was 68 years old     Past Surgical History:  Procedure  Laterality Date    bone spur removal     2016- foot   ARTHROTOMY Right 11/15/2015   Procedure: RIGHT ANKLE ARTHROTOMY ;  Surgeon: Marcene Corning, MD;  Location: [facility] OR;  Service: Orthopedics;  Laterality: Right;   balloon septoplasty     COLONOSCOPY  2011   ELECTROMAGNETIC NAVIGATION BROCHOSCOPY  12/20/2016   FOREIGN BODY REMOVAL Right 11/15/2015   Procedure: RIGHT ANKLE LOOSE BODY REMOVAL;  Surgeon: Marcene Corning, MD;  Location: [facility] OR;  Service: Orthopedics;  Laterality: Right;   HERNIA REPAIR      68 years old   LUNG BIOPSY Right 03/07/2017   Procedure: RIGHT LUNG BIOPSY;  Surgeon: Loreli Slot, MD;  Location: [facility] OR;  Service: Thoracic;  Laterality: Right;   LYMPH NODE DISSECTION Right 03/07/2017   Procedure: LYMPH NODE DISSECTION, right lung;  Surgeon: Loreli Slot, MD;  Location: [facility] OR;  Service: Thoracic;  Laterality: Right;   SEGMENTECOMY Right 03/07/2017   Procedure: RIGHT LOWER LOBE SUPERIOR SEGMENTECTOMY;  Surgeon: Loreli Slot, MD;  Location: [facility] OR;  Service: Thoracic;  Laterality: Right;   VIDEO ASSISTED THORACOSCOPY (VATS)/WEDGE RESECTION Right 03/07/2017   Procedure: RIGHT LUNG VIDEO ASSISTED THORACOSCOPY (VATS)/WEDGE RESECTION;  Surgeon: Loreli Slot, MD;  Location: [facility] OR;  Service: Thoracic;  Laterality: Right;    Family History  Problem Relation Age of Onset   Hypertension Mother        uncontrolled   Stroke Mother        48   Other Mother        died- fell after stroke and broke ribs- "lungs filled with fluid"   Other Father        unknown cause of death age 77   Rheum arthritis Father    Other Brother        some step siblingsonly   Colon cancer Neg Hx    Colon polyps Neg Hx    Esophageal cancer Neg Hx    Rectal cancer Neg Hx    Stomach cancer Neg Hx     Social History   Tobacco Use   Smoking status: Never   Smokeless tobacco: Never  Vaping Use   Vaping status: Never Used  Substance Use Topics   Alcohol use: Yes    Alcohol/week: 8.0 standard drinks of alcohol    Types: 1 Glasses of wine, 7 Standard drinks or equivalent per week    Comment: occ   Drug use: No    Review of Systems  Constitutional:  Negative for fever.  HENT:  Positive for congestion. Negative for ear pain, rhinorrhea and sore throat.   Respiratory:  Positive for cough.   Gastrointestinal:  Negative for abdominal pain, nausea and vomiting.  Musculoskeletal:  Negative for myalgias.  Neurological:  Negative for headaches.     Objective:    Vitals: BP (!) 144/77 (BP Location: Right Arm)   Pulse 83   Temp 97.7 F (36.5 C)   Resp 18   SpO2 97%   Physical Exam Constitutional:      General: He is not in acute distress.    Appearance: Normal appearance. He is well-developed and overweight. He is not ill-appearing or toxic-appearing.  HENT:     Head: Normocephalic and atraumatic.  Cardiovascular:     Rate and Rhythm: Normal rate and regular rhythm.     Heart sounds: Normal heart sounds.  Pulmonary:     Effort: Pulmonary effort is normal.     Breath sounds: Examination  of the left-lower field reveals wheezing. Wheezing present.     Comments: Slight wheeze in the left lower lung. Abdominal:     General: Bowel sounds are normal.     Palpations: Abdomen is soft.     Tenderness: There is no abdominal tenderness.  Skin:    General: Skin is warm and dry.  Neurological:     General: No focal deficit present.     Mental Status: He is alert.  Psychiatric:        Mood and Affect: Mood and affect normal.     Results:  Labs: No results found for this or any previous visit (from the past 24 hours).  Radiology: No results found.   UC Course/Treatments:  Procedures: Procedures   Medications Ordered in UC: Medications - No data to display   Assessment and Plan :     ICD-10-CM   1. Subacute cough  R05.2 Comprehensive metabolic panel    CBC with Differential/Platelet    DG Chest 2 View    Comprehensive metabolic panel    CBC with Differential/Platelet    DG Chest 2 View    2. History of influenza  Z87.09     3. Persistent cough  R05.3      Subacute cough Afebrile, nontoxic-appearing, NAD. VSS. DDX includes but not limited to: bronchitis, pneumonia, reactive airway disease, COPD, asthma, secondary to ACE inhibitor, malignancy, CHF Subacute cough ongoing since flu diagnosis 3 weeks ago. Suspect postviral cough versus bronchitis. Lungs sounds improved since prior visit on 01/23/2024. Imaging negative for  infiltrate and no change from his previous visit. Perhaps slight improvement on lateral view. CBC and CMP are pending give ongoing cough as well to evaluate WBC and organ function. Will place patient on longer term daily inhaler. Symbicort 2 puffs BID was prescribed. Benzonatate 200mg  TID PRN was prescribed for cough as well. Will avoid any further ABX at this time given recent Augmentin and Zithromax use. Patient has PCP appointment in 2 weeks. Patient declined steroid injection at today's visit. Recommend he follow up as directed. If no improvement, recommend he discuss further imaging or pulmonologist referral with PCP. Strict ED precautions were given and patient verbalized understanding.  History of influenza Afebrile, nontoxic-appearing, NAD. VSS. Flu A positive in Jan. 2025. Persistent cough since that time. Strict ED precautions were given and patient verbalized understanding.  ED Discharge Orders          Ordered    benzonatate (TESSALON) 200 MG capsule  3 times daily PRN        02/06/24 1126    budesonide-formoterol (SYMBICORT) 160-4.5 MCG/ACT inhaler  2 times daily        02/06/24 1126             PDMP not reviewed this encounter.     Cynda Acres, PA-C 02/06/24 1130

## 2024-02-06 NOTE — Discharge Instructions (Addendum)
Your imaging from your previous visit is similar to today's visit. Your blood work was sent to the lab for further testing. Someone from our office will call you with your results.  I have sent you a cough medicine to take as needed as well as a daily inhaler to use. The inhaler will hopefully prevent bronchospasm you are having throughout the day. Take as directed.    Make sure you keep your appointment that you have scheduled with her PCP for follow up. It is very important for you to pay attention to any new symptoms or worsening of your current condition. Please go directly to the Emergency Department immediately should you begin to have any of the following symptoms: shortness of breath, chest pain or difficulty breathing.

## 2024-02-06 NOTE — Telephone Encounter (Signed)
Opened in error

## 2024-02-06 NOTE — ED Triage Notes (Signed)
Patient C/O Productive cough that has been persistent. Symptoms over one month. Patient states recent "lung and sinus infection" Patient states he feels the sinus infection has cleared up but patient feels the cough has not resolved.

## 2024-02-07 LAB — COMPREHENSIVE METABOLIC PANEL
ALT: 16 [IU]/L (ref 0–44)
AST: 17 [IU]/L (ref 0–40)
Albumin: 4.3 g/dL (ref 3.9–4.9)
Alkaline Phosphatase: 61 [IU]/L (ref 44–121)
BUN/Creatinine Ratio: 14 (ref 10–24)
BUN: 16 mg/dL (ref 8–27)
Bilirubin Total: 0.3 mg/dL (ref 0.0–1.2)
CO2: 22 mmol/L (ref 20–29)
Calcium: 9.8 mg/dL (ref 8.6–10.2)
Chloride: 101 mmol/L (ref 96–106)
Creatinine, Ser: 1.13 mg/dL (ref 0.76–1.27)
Globulin, Total: 2.6 g/dL (ref 1.5–4.5)
Glucose: 109 mg/dL — ABNORMAL HIGH (ref 70–99)
Potassium: 4.9 mmol/L (ref 3.5–5.2)
Sodium: 138 mmol/L (ref 134–144)
Total Protein: 6.9 g/dL (ref 6.0–8.5)
eGFR: 71 mL/min/{1.73_m2} (ref >59)

## 2024-02-07 LAB — CBC WITH DIFFERENTIAL/PLATELET
Basophils Absolute: 0.1 10*3/uL (ref 0.0–0.2)
Basos: 1 %
EOS (ABSOLUTE): 0.2 10*3/uL (ref 0.0–0.4)
Eos: 3 %
Hematocrit: 43.7 % (ref 37.5–51.0)
Hemoglobin: 14.5 g/dL (ref 13.0–17.7)
Immature Grans (Abs): 0 10*3/uL (ref 0.0–0.1)
Immature Granulocytes: 0 %
Lymphocytes Absolute: 0.9 10*3/uL (ref 0.7–3.1)
Lymphs: 12 %
MCH: 31.5 pg (ref 26.6–33.0)
MCHC: 33.2 g/dL (ref 31.5–35.7)
MCV: 95 fL (ref 79–97)
Monocytes Absolute: 0.7 10*3/uL (ref 0.1–0.9)
Monocytes: 10 %
Neutrophils Absolute: 5.5 10*3/uL (ref 1.4–7.0)
Neutrophils: 74 %
Platelets: 279 10*3/uL (ref 150–450)
RBC: 4.61 x10E6/uL (ref 4.14–5.80)
RDW: 12.5 % (ref 11.6–15.4)
WBC: 7.4 10*3/uL (ref 3.4–10.8)

## 2024-02-12 ENCOUNTER — Encounter: Payer: Self-pay | Admitting: Family Medicine

## 2024-02-14 ENCOUNTER — Ambulatory Visit: Payer: Medicare Other | Admitting: Family Medicine

## 2024-02-20 ENCOUNTER — Encounter: Payer: Self-pay | Admitting: Family Medicine

## 2024-02-20 ENCOUNTER — Ambulatory Visit (INDEPENDENT_AMBULATORY_CARE_PROVIDER_SITE_OTHER): Payer: Medicare Other | Admitting: Family Medicine

## 2024-02-20 VITALS — BP 126/76 | HR 70 | Temp 97.5°F | Ht 74.0 in | Wt 223.0 lb

## 2024-02-20 DIAGNOSIS — Z131 Encounter for screening for diabetes mellitus: Secondary | ICD-10-CM

## 2024-02-20 DIAGNOSIS — E785 Hyperlipidemia, unspecified: Secondary | ICD-10-CM

## 2024-02-20 DIAGNOSIS — I10 Essential (primary) hypertension: Secondary | ICD-10-CM | POA: Diagnosis not present

## 2024-02-20 DIAGNOSIS — M1000 Idiopathic gout, unspecified site: Secondary | ICD-10-CM

## 2024-02-20 DIAGNOSIS — R739 Hyperglycemia, unspecified: Secondary | ICD-10-CM

## 2024-02-20 DIAGNOSIS — Z125 Encounter for screening for malignant neoplasm of prostate: Secondary | ICD-10-CM

## 2024-02-20 LAB — LIPID PANEL
Cholesterol: 173 mg/dL (ref 0–200)
HDL: 74.9 mg/dL (ref >39.00)
LDL Cholesterol: 83 mg/dL (ref 0–99)
NonHDL: 98.57
Total CHOL/HDL Ratio: 2
Triglycerides: 79 mg/dL (ref 0.0–149.0)
VLDL: 15.8 mg/dL (ref 0.0–40.0)

## 2024-02-20 LAB — HEMOGLOBIN A1C: Hgb A1c MFr Bld: 5.7 % (ref 4.6–6.5)

## 2024-02-20 LAB — URIC ACID: Uric Acid, Serum: 6.3 mg/dL (ref 4.0–7.8)

## 2024-02-20 LAB — PSA, MEDICARE: PSA: 1.34 ng/mL (ref 0.10–4.00)

## 2024-02-20 NOTE — Addendum Note (Signed)
 Addended by: Shelva Majestic on: 02/20/2024 08:42 PM   Modules accepted: Level of Service

## 2024-02-20 NOTE — Patient Instructions (Addendum)
 Consider Tetanus, Diphtheria, and Pertussis (Tdap) at pharmacy- definitely need this if get cut/scrape  hed like to trial off lisinopril and see if his blood pressure can tolerate this and may help with cough issues as well after recent illness. Advised 10 mg daily for 2 weeks and if still <130/80 then can trial off completely. Could send alternate ARB medicine like valsartan potentially   Please stop by lab before you go If you have mychart- we will send your results within 3 business days of [place] receiving them.  If you do not have mychart- we will call you about results within 5 business days of [place] receiving them.  *please also note that you will see labs on mychart as soon as they post. I will later go in and write notes on them- will say "notes from Dr. Durene Cal"   Recommended follow up: Return in about 6 months (around 08/19/2024) for followup or sooner if needed.Schedule b4 you leave.

## 2024-02-20 NOTE — Progress Notes (Signed)
 Phone [phone] In person visit   Subjective:   Ricardo Garcia is a 68 y.o. year old very pleasant male patient who presents for/with See problem oriented charting Chief Complaint  Patient presents with   6 month follow-up   Past Medical History-  Patient Active Problem List   Diagnosis Date Noted   Gout 11/26/2023    Priority: Medium    PVC's (premature ventricular contractions) 07/06/2019    Priority: Medium    Eczema 04/15/2017    Priority: Medium    Sinusitis, chronic 07/28/2013    Priority: Medium    Hypertension 01/26/2012    Priority: Medium    Hyperlipidemia 01/26/2012    Priority: Medium     Medications- reviewed and updated Current Outpatient Medications  Medication Sig Dispense Refill   albuterol (VENTOLIN HFA) 108 (90 Base) MCG/ACT inhaler Inhale 2 puffs into the lungs every 6 (six) hours as needed for wheezing or shortness of breath. 18 g 0   allopurinol (ZYLOPRIM) 100 MG tablet Take 1 tablet (100 mg total) by mouth daily. 30 tablet 11   benzonatate (TESSALON) 200 MG capsule Take 1 capsule (200 mg total) by mouth 3 (three) times daily as needed for cough. 30 capsule 0   Bromelains 500 MG TABS Take 1 tablet by mouth daily.      budesonide-formoterol (SYMBICORT) 160-4.5 MCG/ACT inhaler Inhale 2 puffs into the lungs 2 (two) times daily. 1 each 1   clobetasol (TEMOVATE) 0.05 % external solution Apply 1 application topically 2 (two) times daily. 7 days maximum. For scalp (Patient taking differently: Apply 1 application  topically daily as needed. As needed.) 50 mL 2   diltiazem (CARDIZEM CD) 120 MG 24 hr capsule TAKE 1 CAPSULE BY MOUTH EVERY DAY 90 capsule 3   lisinopril (ZESTRIL) 20 MG tablet TAKE 1 TABLET DAILY 90 tablet 3   mometasone (ELOCON) 0.1 % cream Apply 1 application. topically daily.     Multiple Vitamin (MULTIVITAMIN) capsule Take 1 capsule by mouth daily.     ranolazine (RANEXA) 500 MG 12 hr tablet Take 500 mg by mouth 2 (two) times daily.      rosuvastatin (CRESTOR) 10 MG tablet TAKE 1 TABLET DAILY 90 tablet 3   sildenafil (VIAGRA) 100 MG tablet Take 1 tablet (100 mg total) by mouth daily as needed for erectile dysfunction. 30 tablet 5   No current facility-administered medications for this visit.     Objective:  BP 126/76   Pulse 70   Temp (!) 97.5 F (36.4 C) (Temporal)   Ht 6\' 2"  (1.88 m)   Wt 223 lb (101.2 kg)   SpO2 97%   BMI 28.63 kg/m  Gen: NAD, resting comfortably CV: RRR no murmurs rubs or gallops Lungs: CTAB no crackles, wheeze, rhonchi Abdomen: soft/nontender/nondistended/normal bowel sounds. No rebound or guarding. Umbilical hernia reducible Ext: no edema Skin: warm, dry     Assessment and Plan   # Bronchitis-diagnosed January 23, 2024-initially treated with Augmentin and azithromycin to cover for typical and atypical bacterial infections as they were also concerned about pneumonia developing you know chest x-ray is clear.  This was also to cover his sinuses-had return visit on 02/06/2024 with lingering issues and was started on Symbicort and advised PCP follow-up- was scheduled today.  Patient reported positive test in January- reports at beginning had 4 days of fever but that had resolved by time of January visit.  - he feels back to his baseline- finishing out the steroid inhaler  -did make  umbilical hernia larger- but prefers to avoid surgery  #hypertension/PVC's-  [facility]  S: medication: diltiazem 120 mg XR (generally controls PACs/PVC's) but in 2024 ranexa started by [facility] and helpful, lisinopril 20 mg BP Readings from Last 3 Encounters:  02/20/24 126/76  02/06/24 (!) 144/77  01/23/24 (!) 120/56  A/P: HTN controlled- hed like to trial off lisinopril and see if his blood pressure can tolerate this and may help with cough issues as well after recent illness. Advised 10 mg daily for 2 weeks and if still <130/80 then can trial off completely. Could send alternate ARB medicine like valsartan  potentially  - has worked hard on weight loss and down 8 lbs from October. Down 15 lbs from his peak -still with intermittent PVC's- slightly more in last 2 weeks  #hyperlipidemia- myalgias so have not increased dose as well as prediabetes  S: Medication:rosuvastatin 10 mg daily  Lab Results  Component Value Date   CHOL 194 02/04/2023   HDL 60.30 02/04/2023   LDLCALC 111 (H) 02/04/2023   LDLDIRECT 110.0 08/06/2023   TRIG 110.0 02/04/2023   CHOLHDL 3 02/04/2023   A/P: very mildly above goal but with myalgias and prediabetes risk we will likely stay stable on dosing- update lipid panel  #Gout S: Medication: Allopurinol 100 mg started in 2024 with uric acid 7.4- no flare ssince starting As needed: Prednisone-as colchicine with interactions with Ranexa and diltiazem         A/P:no flares- doing well- update uric acid   # Hyperglycemia/insulin resistance/prediabetes- a1c 5.31 January 2023 S:  Medication: none Lab Results  Component Value Date   HGBA1C 5.8 08/06/2023   HGBA1C 5.8 02/04/2023   HGBA1C 5.7 01/31/2022  A/P: suspect stable or better with weight loss- update a1c today  #Prostate cancer screening- low risk prior trend- update psa today  Lab Results  Component Value Date   PSA 1.07 02/04/2023   PSA 0.95 01/31/2022   PSA 0.83 01/27/2021   #Immunizations- up to date other than Tetanus, Diphtheria, and Pertussis (Tdap)  Immunization History  Administered Date(s) Administered   Influenza Inj Mdck Quad Pf 10/29/2017   Influenza Split 10/16/2012, 09/23/2013, 12/24/2016   Influenza, Seasonal, Injecte, Preservative Fre 10/01/2015, 12/24/2016   Influenza,inj,Quad PF,6+ Mos 10/01/2015, 10/21/2018, 09/14/2019, 09/29/2020   Influenza-Unspecified 10/18/2014, 10/01/2015, 12/24/2016, 10/04/2021, 11/17/2022   PFIZER Comirnaty(Gray Top)Covid-19 Tri-Sucrose Vaccine 03/30/2021   PFIZER(Purple Top)SARS-COV-2 Vaccination 02/14/2020, 03/09/2020, 10/14/2020, 03/30/2021   Pneumococcal  Conjugate-13 01/27/2021   Pneumococcal Polysaccharide-23 02/11/2017   RSV,unspecified 11/17/2022   Tdap 12/24/2010   Zoster Recombinant(Shingrix) 11/01/2017, 01/31/2018   Zoster, Live 12/24/2010   Recommended follow up: Return in about 6 months (around 08/19/2024) for followup or sooner if needed.Schedule b4 you leave. Future Appointments  Date Time Provider Department Center  05/21/2024 11:30 AM [facility] ANNUAL WELLNESS VISIT 1 [facility]   Lab/Order associations:   ICD-10-CM   1. Primary hypertension  I10 Lipid panel    2. Hyperlipidemia, unspecified hyperlipidemia type  E78.5 Lipid panel    3. Idiopathic gout, unspecified chronicity, unspecified site  M10.00 Uric acid    4. Screening for prostate cancer  Z12.5 PSA, Medicare    5. Hyperglycemia  R73.9 Hemoglobin A1c    6. Screening for diabetes mellitus  Z13.1 Hemoglobin A1c      No orders of the defined types were placed in this encounter.   Return precautions advised.  Tana Conch, MD

## 2024-03-12 ENCOUNTER — Encounter: Payer: Self-pay | Admitting: Family Medicine

## 2024-04-03 ENCOUNTER — Other Ambulatory Visit: Payer: Self-pay | Admitting: Family Medicine

## 2024-04-15 ENCOUNTER — Encounter: Payer: Self-pay | Admitting: Family Medicine

## 2024-04-21 MED ORDER — ALLOPURINOL 100 MG PO TABS: 200.0000 mg | ORAL_TABLET | Freq: Every day | ORAL | 11 refills | Status: DC

## 2024-05-21 ENCOUNTER — Ambulatory Visit: Payer: Medicare Other

## 2024-05-21 VITALS — Ht 74.0 in | Wt 223.0 lb

## 2024-05-21 DIAGNOSIS — Z0000 Encounter for general adult medical examination without abnormal findings: Secondary | ICD-10-CM | POA: Diagnosis not present

## 2024-05-21 NOTE — Progress Notes (Signed)
 Subjective:   Ricardo Garcia is a 68 y.o. who presents for a Medicare Wellness preventive visit.  As a reminder, Annual Wellness Visits don't include a physical exam, and some assessments may be limited, especially if this visit is performed virtually. We may recommend an in-person follow-up visit with your provider if needed.  Visit Complete: Virtual I connected with  Abelino Hof on 05/21/24 by a audio enabled telemedicine application and verified that I am speaking with the correct person using two identifiers.  Patient Location: Home  Provider Location: Office/Clinic  I discussed the limitations of evaluation and management by telemedicine. The patient expressed understanding and agreed to proceed.  Vital Signs: Because this visit was a virtual/telehealth visit, some criteria may be missing or patient reported. Any vitals not documented were not able to be obtained and vitals that have been documented are patient reported.  VideoDeclined- This patient declined Librarian, academic. Therefore the visit was completed with audio only.  Persons Participating in Visit: Patient.  AWV Questionnaire: No: Patient Medicare AWV questionnaire was not completed prior to this visit.  Cardiac Risk Factors include: advanced age (>22men, >26 women);dyslipidemia;hypertension;male gender     Objective:     Today's Vitals   05/21/24 1118  Weight: 223 lb (101.2 kg)  Height: 6\' 2"  (1.88 m)   Body mass index is 28.63 kg/m.     05/21/2024   11:20 AM 05/16/2023   12:06 PM 05/08/2022   11:09 AM 03/09/2017    5:00 PM 03/05/2017   12:40 PM 11/14/2015    8:22 AM  Advanced Directives  Does Patient Have a Medical Advance Directive? Yes Yes Yes Yes Yes Yes  Type of Estate agent of [place];Living will Healthcare Power of [place];Living will Healthcare Power of [place];Living will Healthcare Power of [place];Living will Healthcare Power of  [place];Living will   Does patient want to make changes to medical advance directive?    No - Patient declined    Copy of Healthcare Power of Attorney in Chart? No - copy requested No - copy requested No - copy requested No - copy requested No - copy requested No - copy requested    Current Medications (verified) Outpatient Encounter Medications as of 05/21/2024  Medication Sig   albuterol  (VENTOLIN  HFA) 108 (90 Base) MCG/ACT inhaler Inhale 2 puffs into the lungs every 6 (six) hours as needed for wheezing or shortness of breath.   allopurinol  (ZYLOPRIM ) 100 MG tablet Take 2 tablets (200 mg total) by mouth daily.   Bromelains 500 MG TABS Take 1 tablet by mouth daily.    budesonide -formoterol  (SYMBICORT ) 160-4.5 MCG/ACT inhaler Inhale 2 puffs into the lungs 2 (two) times daily.   clobetasol  (TEMOVATE ) 0.05 % external solution Apply 1 application topically 2 (two) times daily. 7 days maximum. For scalp (Patient taking differently: Apply 1 application  topically daily as needed. As needed.)   diltiazem  (CARDIZEM  CD) 120 MG 24 hr capsule TAKE 1 CAPSULE BY MOUTH EVERY DAY   lisinopril  (ZESTRIL ) 20 MG tablet TAKE 1 TABLET DAILY   mometasone (ELOCON) 0.1 % cream Apply 1 application. topically daily.   Multiple Vitamin (MULTIVITAMIN) capsule Take 1 capsule by mouth daily.   ranolazine (RANEXA) 500 MG 12 hr tablet Take 500 mg by mouth 2 (two) times daily.   rosuvastatin  (CRESTOR ) 10 MG tablet TAKE 1 TABLET DAILY   sildenafil  (VIAGRA ) 100 MG tablet Take 1 tablet (100 mg total) by mouth daily as needed for erectile dysfunction.  No facility-administered encounter medications on file as of 05/21/2024.    Allergies (verified) Levaquin  [levofloxacin ] and Doxycycline    History: Past Medical History:  Diagnosis Date   Diverticulitis    Hyperlipidemia    Hypertension    PONV (postoperative nausea and vomiting)    when he was 68 years old   Past Surgical History:  Procedure Laterality Date    bone  spur removal     2016- foot   ARTHROTOMY Right 11/15/2015   Procedure: RIGHT ANKLE ARTHROTOMY ;  Surgeon: Dayne Even, MD;  Location: [facility] OR;  Service: Orthopedics;  Laterality: Right;   balloon septoplasty     COLONOSCOPY  2011   ELECTROMAGNETIC NAVIGATION BROCHOSCOPY  12/20/2016   FOREIGN BODY REMOVAL Right 11/15/2015   Procedure: RIGHT ANKLE LOOSE BODY REMOVAL;  Surgeon: Dayne Even, MD;  Location: [facility] OR;  Service: Orthopedics;  Laterality: Right;   HERNIA REPAIR     68 years old   LUNG BIOPSY Right 03/07/2017   Procedure: RIGHT LUNG BIOPSY;  Surgeon: Zelphia Higashi, MD;  Location[facility] OR;  Service: Thoracic;  Laterality: Right;   LYMPH NODE DISSECTION Right 03/07/2017   Procedure: LYMPH NODE DISSECTION, right lung;  Surgeon: Zelphia Higashi, MD;  Location: [facility] OR;  Service: Thoracic;  Laterality: Right;   SEGMENTECOMY Right 03/07/2017   Procedure: RIGHT LOWER LOBE SUPERIOR SEGMENTECTOMY;  Surgeon: Zelphia Higashi, MD;  Location: [facility] OR;  Service: Thoracic;  Laterality: Right;   VIDEO ASSISTED THORACOSCOPY (VATS)/WEDGE RESECTION Right 03/07/2017   Procedure: RIGHT LUNG VIDEO ASSISTED THORACOSCOPY (VATS)/WEDGE RESECTION;  Surgeon: Zelphia Higashi, MD;  Location: [facility] OR;  Service: Thoracic;  Laterality: Right;   Family History  Problem Relation Age of Onset   Hypertension Mother        uncontrolled   Stroke Mother        60   Other Mother        died- fell after stroke and broke ribs- "lungs filled with fluid"   Other Father        unknown cause of death age 40   Rheum arthritis Father    Other Brother        some step siblingsonly   Colon cancer Neg Hx    Colon polyps Neg Hx    Esophageal cancer Neg Hx    Rectal cancer Neg Hx    Stomach cancer Neg Hx    Social History   Socioeconomic History   Marital status: Divorced    Spouse name: Not on file   Number of children: Not on file   Years of education: Not on file   Highest education level: Bachelor's  degree (e.g., BA, AB, BS)  Occupational History   Not on file  Tobacco Use   Smoking status: Never   Smokeless tobacco: Never  Vaping Use   Vaping status: Never Used  Substance and Sexual Activity   Alcohol use: Yes    Alcohol/week: 8.0 standard drinks of alcohol    Types: 1 Glasses of wine, 7 Standard drinks or equivalent per week    Comment: occ   Drug use: No   Sexual activity: Yes    Partners: Female  Other Topics Concern   Not on file  Social History Narrative   Divorced. Lives alone. 2 children- son 53 at [place], daughter 39- [employer].       [facility].    [employer]- [employer]- works in Airline pilot  Hobbies: gym 4-5 days a week, guitar, time with friends and family   Social Drivers of Corporate investment banker Strain: Low Risk  (05/21/2024)   Overall Financial Resource Strain (CARDIA)    Difficulty of Paying Living Expenses: Not hard at all  Food Insecurity: No Food Insecurity (05/21/2024)   Hunger Vital Sign    Worried About Running Out of Food in the Last Year: Never true    Ran Out of Food in the Last Year: Never true  Transportation Needs: No Transportation Needs (05/21/2024)   PRAPARE - Administrator, Civil Service (Medical): No    Lack of Transportation (Non-Medical): No  Physical Activity: Sufficiently Active (05/21/2024)   Exercise Vital Sign    Days of Exercise per Week: 5 days    Minutes of Exercise per Session: 60 min  Stress: No Stress Concern Present (05/21/2024)   [employer] of Occupational Health - Occupational Stress Questionnaire    Feeling of Stress : Not at all  Social Connections: Moderately Isolated (05/21/2024)   Social Connection and Isolation Panel [NHANES]    Frequency of Communication with Friends and Family: More than three times a week    Frequency of Social Gatherings with Friends and Family: More than three times a week    Attends Religious Services: Never    Database administrator or  Organizations: Yes    Attends Engineer, structural: 1 to 4 times per year    Marital Status: Divorced    Tobacco Counseling Counseling given: Not Answered    Clinical Intake:  Pre-visit preparation completed: Yes  Pain : No/denies pain     BMI - recorded: 28.63 Nutritional Status: BMI 25 -29 Overweight Nutritional Risks: None Diabetes: No  Lab Results  Component Value Date   HGBA1C 5.7 02/20/2024   HGBA1C 5.8 08/06/2023   HGBA1C 5.8 02/04/2023     How often do you need to have someone help you when you read instructions, pamphlets, or other written materials from your doctor or pharmacy?: 1 - Never  Interpreter Needed?: No  Information entered by :: Lamont Pilsner, LPN   Activities of Daily Living     05/21/2024   11:19 AM  In your present state of health, do you have any difficulty performing the following activities:  Hearing? 0  Vision? 0  Difficulty concentrating or making decisions? 0  Walking or climbing stairs? 0  Dressing or bathing? 0  Doing errands, shopping? 0  Preparing Food and eating ? N  Using the Toilet? N  In the past six months, have you accidently leaked urine? N  Do you have problems with loss of bowel control? N  Managing your Medications? N  Managing your Finances? N  Housekeeping or managing your Housekeeping? N    Patient Care Team: Almira Jaeger, MD as PCP - General (Family Medicine) Zelphia Higashi, MD as Consulting Physician (Cardiothoracic Surgery)  Indicate any recent Medical Services you may have received from other than [facility] providers in the past year (date may be approximate).     Assessment:    This is a routine wellness examination for Conley.  Hearing/Vision screen Hearing Screening - Comments:: Pt denies any hearing issues  Vision Screening - Comments:: Wears rx glasses - up to date with routine eye exams with [facility] in [place]     Goals Addressed             This Visit's  Progress  Patient Stated       Weight loss       Depression Screen     05/21/2024   11:21 AM 02/20/2024    9:04 AM 11/26/2023    3:37 PM 09/26/2023    2:47 PM 08/06/2023    9:16 AM 05/16/2023   12:05 PM 02/04/2023    8:13 AM  PHQ 2/9 Scores  PHQ - 2 Score 0 0 0 0 0 0 0  PHQ- 9 Score     0  1    Fall Risk     05/21/2024   11:22 AM 02/20/2024    9:04 AM 11/26/2023    3:36 PM 09/26/2023    2:47 PM 08/06/2023    9:15 AM  Fall Risk   Falls in the past year? 0 0 0 0 0  Number falls in past yr: 0 0 0 0 0  Injury with Fall? 0 0 0 0 0  Risk for fall due to : No Fall Risks No Fall Risks No Fall Risks No Fall Risks No Fall Risks  Follow up Falls prevention discussed Falls evaluation completed Falls evaluation completed Falls evaluation completed Falls evaluation completed    MEDICARE RISK AT HOME:  Medicare Risk at Home Any stairs in or around the home?: Yes If so, are there any without handrails?: No Home free of loose throw rugs in walkways, pet beds, electrical cords, etc?: Yes Adequate lighting in your home to reduce risk of falls?: Yes Life alert?: No Use of a cane, walker or w/c?: No Grab bars in the bathroom?: No Shower chair or bench in shower?: No Elevated toilet seat or a handicapped toilet?: No  TIMED UP AND GO:  Was the test performed?  No  Cognitive Function: 6CIT completed        05/21/2024   11:22 AM 05/16/2023   12:46 PM 05/08/2022   11:12 AM  6CIT Screen  What Year? 0 points 0 points 0 points  What month? 0 points 0 points 0 points  What time? 0 points 0 points 0 points  Count back from 20 0 points 0 points 0 points  Months in reverse 0 points 0 points 0 points  Repeat phrase 0 points 0 points 0 points  Total Score 0 points 0 points 0 points    Immunizations Immunization History  Administered Date(s) Administered   Fluad Quad(high Dose 65+) 11/17/2022   Influenza Inj Mdck Quad Pf 01/21/2017, 10/29/2017   Influenza Split 10/16/2012, 09/23/2013,  12/24/2016   Influenza, Seasonal, Injecte, Preservative Fre 10/01/2015, 12/24/2016   Influenza,inj,Quad PF,6+ Mos 10/01/2015, 10/21/2018, 09/14/2019, 09/29/2020   Influenza,inj,Quad PF,6-35 Mos 09/14/2019   Influenza-Unspecified 10/18/2014, 10/01/2015, 12/24/2016, 10/04/2021, 11/17/2022   PFIZER Comirnaty(Gray Top)Covid-19 Tri-Sucrose Vaccine 03/30/2021   PFIZER(Purple Top)SARS-COV-2 Vaccination 02/14/2020, 03/09/2020, 10/14/2020, 03/30/2021   Pneumococcal Conjugate-13 01/27/2021   Pneumococcal Polysaccharide-23 02/11/2017   RSV,unspecified 11/17/2022   Respiratory Syncytial Virus Vaccine,Recomb Aduvanted(Arexvy) 11/17/2022   Tdap 12/24/2010   Zoster Recombinant(Shingrix ) 11/01/2017, 01/31/2018   Zoster, Live 12/24/2010    Screening Tests Health Maintenance  Topic Date Due   COVID-19 Vaccine (6 - 2024-25 season) 08/25/2023   INFLUENZA VACCINE  07/24/2024   Medicare Annual Wellness (AWV)  05/21/2025   Pneumonia Vaccine 12+ Years old (3 of 3 - PCV20 or PCV21) 01/27/2026   Fecal DNA (Cologuard)  06/11/2026   Hepatitis C Screening  Completed   Zoster Vaccines- Shingrix   Completed   HPV VACCINES  Aged Out   Meningococcal B Vaccine  Aged Out  DTaP/Tdap/Td  Discontinued   Colonoscopy  Discontinued    Health Maintenance  Health Maintenance Due  Topic Date Due   COVID-19 Vaccine (6 - 2024-25 season) 08/25/2023   Health Maintenance Items Addressed: See Nurse Notes  Additional Screening:  Vision Screening: Recommended annual ophthalmology exams for early detection of glaucoma and other disorders of the eye.  Dental Screening: Recommended annual dental exams for proper oral hygiene  Community Resource Referral / Chronic Care Management: CRR required this visit?  No   CCM required this visit?  No   Plan:    I have personally reviewed and noted the following in the patient's chart:   Medical and social history Use of alcohol, tobacco or illicit drugs  Current  medications and supplements including opioid prescriptions. Patient is not currently taking opioid prescriptions. Functional ability and status Nutritional status Physical activity Advanced directives List of other physicians Hospitalizations, surgeries, and ER visits in previous 12 months Vitals Screenings to include cognitive, depression, and falls Referrals and appointments  In addition, I have reviewed and discussed with patient certain preventive protocols, quality metrics, and best practice recommendations. A written personalized care plan for preventive services as well as general preventive health recommendations were provided to patient.   Bruno Capri, LPN   1/61/0960   After Visit Summary: (MyChart) Due to this being a telephonic visit, the after visit summary with patients personalized plan was offered to patient via MyChart   Notes: Nothing significant to report at this time.

## 2024-05-21 NOTE — Patient Instructions (Signed)
 Ricardo Garcia , Thank you for taking time out of your busy schedule to complete your Annual Wellness Visit with me. I enjoyed our conversation and look forward to speaking with you again next year. I, as well as your care team,  appreciate your ongoing commitment to your health goals. Please review the following plan we discussed and let me know if I can assist you in the future. Your Game plan/ To Do List    Referrals: If you haven't heard from the office you've been referred to, please reach out to them at the phone provided.   Follow up Visits: Next Medicare AWV with our clinical staff: 06/01/25   Have you seen your provider in the last 6 months (3 months if uncontrolled diabetes)? Yes Next Office Visit with your provider: 08/20/24  Clinician Recommendations:  Aim for 30 minutes of exercise or brisk walking, 6-8 glasses of water, and 5 servings of fruits and vegetables each day.       This is a list of the screening recommended for you and due dates:  Health Maintenance  Topic Date Due   COVID-19 Vaccine (6 - 2024-25 season) 08/25/2023   Medicare Annual Wellness Visit  05/15/2024   Flu Shot  07/24/2024   Pneumonia Vaccine (3 of 3 - PCV20 or PCV21) 01/27/2026   Cologuard (Stool DNA test)  06/11/2026   Hepatitis C Screening  Completed   Zoster (Shingles) Vaccine  Completed   HPV Vaccine  Aged Out   Meningitis B Vaccine  Aged Out   DTaP/Tdap/Td vaccine  Discontinued   Colon Cancer Screening  Discontinued    Advanced directives: (Copy Requested) Please bring a copy of your health care power of attorney and living will to the office to be added to your chart at your convenience. You can mail to [facility] [address] [place], [place] [address] or email to [email or web address] Advance Care Planning is important because it:  [x]  Makes sure you receive the medical care that is consistent with your values, goals, and preferences  [x]  It provides guidance to your  family and loved ones and reduces their decisional burden about whether or not they are making the right decisions based on your wishes.  Follow the link provided in your after visit summary or read over the paperwork we have mailed to you to help you started getting your Advance Directives in place. If you need assistance in completing these, please reach out to us  so that we can help you!  See attachments for Preventive Care and Fall Prevention Tips.

## 2024-06-08 ENCOUNTER — Other Ambulatory Visit: Payer: Self-pay

## 2024-06-08 MED ORDER — LISINOPRIL 20 MG PO TABS: 20.0000 mg | ORAL_TABLET | Freq: Every day | ORAL | 3 refills | Status: DC

## 2024-06-08 MED ORDER — ROSUVASTATIN CALCIUM 10 MG PO TABS: 10.0000 mg | ORAL_TABLET | Freq: Every day | ORAL | 3 refills | Status: DC

## 2024-07-31 ENCOUNTER — Ambulatory Visit: Payer: Self-pay

## 2024-07-31 ENCOUNTER — Telehealth: Admitting: Nurse Practitioner

## 2024-07-31 DIAGNOSIS — J0140 Acute pansinusitis, unspecified: Secondary | ICD-10-CM

## 2024-07-31 MED ORDER — AMOXICILLIN-POT CLAVULANATE 875-125 MG PO TABS
1.0000 | ORAL_TABLET | Freq: Two times a day (BID) | ORAL | 0 refills | Status: DC
Start: 2024-07-31 — End: 2024-08-20

## 2024-07-31 NOTE — Telephone Encounter (Signed)
 No in office or virtual office availability - has virtual UC visit scheduled. Will cancel if provider is comfortable with rx antibiotics w/o visit. Pt states has been done previously d/t his chronic sinusitis.  FYI Only or Action Required?: Action required by provider: clinical question for provider.  Patient was last seen in primary care on 02/20/2024 by Katrinka Garnette KIDD, MD.  Called Nurse Triage reporting Triage.  Symptoms began several weeks ago.  Interventions attempted: Rest, hydration, or home remedies.  Symptoms are: gradually worsening.  Triage Disposition: No disposition on file.  Patient/caregiver understands and will follow disposition?:  Reason for Disposition  [1] Sinus congestion (pressure, fullness) AND [2] present > 10 days  Answer Assessment - Initial Assessment Questions 1. LOCATION: Where does it hurt?      Sinus pressure, congestion, headache  2. ONSET: When did the sinus pain start?  (e.g., hours, days)      2-3 weeks  3. SEVERITY: How bad is the pain?   (Scale 0-10; or none, mild, moderate or severe)     Moderate  4. RECURRENT SYMPTOM: Have you ever had sinus problems before? If Yes, ask: When was the last time? and What happened that time?      Yes x 20 years  8. OTHER SYMPTOMS: Do you have any other symptoms? (e.g., sore throat, cough, earache, difficulty breathing)     Denies  Protocols used: Sinus Pain or Congestion-A-AH Copied from CRM #[id number]. Topic: Clinical - Red Word Triage >> Jul 31, 2024 12:00 PM [place] E wrote: [facility] that prompted transfer to Nurse Triage: Potential sinus infection. Fatigue, and right ear is ringing. Been going on for weeks.

## 2024-07-31 NOTE — Progress Notes (Signed)
 Virtual Visit Consent   Ricardo Garcia, you are scheduled for a virtual visit with a [facility] provider today. Just as with appointments in the office, your consent must be obtained to participate. Your consent will be active for this visit and any virtual visit you may have with one of our providers in the next 365 days. If you have a MyChart account, a copy of this consent can be sent to you electronically.  As this is a virtual visit, video technology does not allow for your provider to perform a traditional examination. This may limit your provider's ability to fully assess your condition. If your provider identifies any concerns that need to be evaluated in person or the need to arrange testing (such as labs, EKG, etc.), we will make arrangements to do so. Although advances in technology are sophisticated, we cannot ensure that it will always work on either your end or our end. If the connection with a video visit is poor, the visit may have to be switched to a telephone visit. With either a video or telephone visit, we are not always able to ensure that we have a secure connection.  By engaging in this virtual visit, you consent to the provision of healthcare and authorize for your insurance to be billed (if applicable) for the services provided during this visit. Depending on your insurance coverage, you may receive a charge related to this service.  I need to obtain your verbal consent now. Are you willing to proceed with your visit today? Ricardo Garcia has provided verbal consent on 07/31/2024 for a virtual visit (video or telephone). Lauraine Kitty, FNP  Date: 07/31/2024 2:45 PM   Virtual Visit via Video Note   I, Lauraine Kitty, connected with  Ricardo Garcia  ([id number], 1956-07-21) on 07/31/24 at  2:45 PM EDT by a video-enabled telemedicine application and verified that I am speaking with the correct person using two identifiers.  Location: Patient: Virtual Visit Location Patient:  Home Provider: Virtual Visit Location Provider: Home Office   I discussed the limitations of evaluation and management by telemedicine and the availability of in person appointments. The patient expressed understanding and agreed to proceed.    History of Present Illness: Ricardo Garcia is a 68 y.o. who identifies as a male who was assigned male at birth, and is being seen today for concerns over sinus congestion.   Patient has a significant history of chronic sinusitis.  He suffers from chronic congestion and at times struggles to differentiate when his congestion has progressed into a secondary infection. Some symptoms in the past that are currently occurring including:   Fatigue, headache and sensitivity to upper dental work and increased ringing in his ears (does have chronic tinnitus) that typically alert the patient to infection  He does see an ENT, 10 days of Augmentin  typically will help him, he has had SE to doxycyline (worsening tinnitus) he will typically get treated once every 6 months   He does use sinus rinses daily, has tried steroid nasal sprays in the past without any change in his chronic symptoms   Has completed allergy  testing and has also had sinus surgery    Problems:  Patient Active Problem List   Diagnosis Date Noted   Gout 11/26/2023   PVC's (premature ventricular contractions) 07/06/2019   Eczema 04/15/2017   Sinusitis, chronic 07/28/2013   Hypertension 01/26/2012   Hyperlipidemia 01/26/2012    Allergies:  Allergies  Allergen Reactions   Levaquin  [Levofloxacin ]  Other (See Comments)    MYALGIAS.SABRASABRANOTED AFTER TREATMENT 10/2016   Doxycycline      Worsening tinnitus   Medications:  Current Outpatient Medications:    albuterol  (VENTOLIN  HFA) 108 (90 Base) MCG/ACT inhaler, Inhale 2 puffs into the lungs every 6 (six) hours as needed for wheezing or shortness of breath., Disp: 18 g, Rfl: 0   allopurinol  (ZYLOPRIM ) 100 MG tablet, Take 2 tablets (200 mg total)  by mouth daily., Disp: 60 tablet, Rfl: 11   Bromelains 500 MG TABS, Take 1 tablet by mouth daily. , Disp: , Rfl:    budesonide -formoterol  (SYMBICORT ) 160-4.5 MCG/ACT inhaler, Inhale 2 puffs into the lungs 2 (two) times daily., Disp: 1 each, Rfl: 1   clobetasol  (TEMOVATE ) 0.05 % external solution, Apply 1 application topically 2 (two) times daily. 7 days maximum. For scalp (Patient taking differently: Apply 1 application  topically daily as needed. As needed.), Disp: 50 mL, Rfl: 2   diltiazem  (CARDIZEM  CD) 120 MG 24 hr capsule, TAKE 1 CAPSULE BY MOUTH EVERY DAY, Disp: 90 capsule, Rfl: 3   lisinopril  (ZESTRIL ) 20 MG tablet, Take 1 tablet (20 mg total) by mouth daily., Disp: 90 tablet, Rfl: 3   mometasone (ELOCON) 0.1 % cream, Apply 1 application. topically daily., Disp: , Rfl:    Multiple Vitamin (MULTIVITAMIN) capsule, Take 1 capsule by mouth daily., Disp: , Rfl:    ranolazine (RANEXA) 500 MG 12 hr tablet, Take 500 mg by mouth 2 (two) times daily., Disp: , Rfl:    rosuvastatin  (CRESTOR ) 10 MG tablet, Take 1 tablet (10 mg total) by mouth daily., Disp: 90 tablet, Rfl: 3   sildenafil  (VIAGRA ) 100 MG tablet, Take 1 tablet (100 mg total) by mouth daily as needed for erectile dysfunction., Disp: 30 tablet, Rfl: 5  Observations/Objective: Patient is well-developed, well-nourished in no acute distress.  Resting comfortably  at home.  Head is normocephalic, atraumatic.  No labored breathing.  Speech is clear and coherent with logical content.  Patient is alert and oriented at baseline.    Assessment and Plan:  1. Acute non-recurrent pansinusitis   Meds ordered this encounter  Medications   amoxicillin -clavulanate (AUGMENTIN ) 875-125 MG tablet    Sig: Take 1 tablet by mouth 2 (two) times daily.    Dispense:  20 tablet    Refill:  0     Follow Up Instructions: I discussed the assessment and treatment plan with the patient. The patient was provided an opportunity to ask questions and all were  answered. The patient agreed with the plan and demonstrated an understanding of the instructions.  A copy of instructions were sent to the patient via MyChart unless otherwise noted below.    The patient was advised to call back or seek an in-person evaluation if the symptoms worsen or if the condition fails to improve as anticipated.    Lauraine Kitty, FNP

## 2024-07-31 NOTE — Telephone Encounter (Signed)
 Pt will need to keep virtual visit for antibiotic.

## 2024-08-20 ENCOUNTER — Encounter: Payer: Self-pay | Admitting: Family Medicine

## 2024-08-20 ENCOUNTER — Ambulatory Visit (INDEPENDENT_AMBULATORY_CARE_PROVIDER_SITE_OTHER): Payer: Medicare Other | Admitting: Family Medicine

## 2024-08-20 VITALS — BP 138/70 | HR 63 | Temp 97.3°F | Wt 229.8 lb

## 2024-08-20 DIAGNOSIS — Z131 Encounter for screening for diabetes mellitus: Secondary | ICD-10-CM

## 2024-08-20 DIAGNOSIS — I10 Essential (primary) hypertension: Secondary | ICD-10-CM | POA: Diagnosis not present

## 2024-08-20 DIAGNOSIS — M1A00X0 Idiopathic chronic gout, unspecified site, without tophus (tophi): Secondary | ICD-10-CM | POA: Diagnosis not present

## 2024-08-20 DIAGNOSIS — I493 Ventricular premature depolarization: Secondary | ICD-10-CM

## 2024-08-20 DIAGNOSIS — R7303 Prediabetes: Secondary | ICD-10-CM | POA: Diagnosis not present

## 2024-08-20 DIAGNOSIS — E785 Hyperlipidemia, unspecified: Secondary | ICD-10-CM | POA: Diagnosis not present

## 2024-08-20 MED ORDER — ALLOPURINOL 100 MG PO TABS: 200.0000 mg | ORAL_TABLET | Freq: Every day | ORAL | 3 refills | Status: DC

## 2024-08-20 NOTE — Progress Notes (Signed)
 Phone [phone] In person visit   Subjective:   Ricardo Garcia is a 68 y.o. year old very pleasant male patient who presents for/with See problem oriented charting Chief Complaint  Patient presents with   Follow-up    Room 7. 6 month follow up.   Past Medical History-  Patient Active Problem List   Diagnosis Date Noted   Gout 11/26/2023    Priority: Medium    PVC's (premature ventricular contractions) 07/06/2019    Priority: Medium    Eczema 04/15/2017    Priority: Medium    Sinusitis, chronic 07/28/2013    Priority: Medium    Hypertension 01/26/2012    Priority: Medium    Hyperlipidemia 01/26/2012    Priority: Medium     Medications- reviewed and updated Current Outpatient Medications  Medication Sig Dispense Refill   albuterol  (VENTOLIN  HFA) 108 (90 Base) MCG/ACT inhaler Inhale 2 puffs into the lungs every 6 (six) hours as needed for wheezing or shortness of breath. 18 g 0   diltiazem  (CARDIZEM  CD) 120 MG 24 hr capsule TAKE 1 CAPSULE BY MOUTH EVERY DAY 90 capsule 3   lisinopril  (ZESTRIL ) 20 MG tablet Take 1 tablet (20 mg total) by mouth daily. 90 tablet 3   Multiple Vitamin (MULTIVITAMIN) capsule Take 1 capsule by mouth daily.     ranolazine (RANEXA) 500 MG 12 hr tablet Take 500 mg by mouth 2 (two) times daily.     rosuvastatin  (CRESTOR ) 10 MG tablet Take 1 tablet (10 mg total) by mouth daily. 90 tablet 3   sildenafil  (VIAGRA ) 100 MG tablet Take 1 tablet (100 mg total) by mouth daily as needed for erectile dysfunction. 30 tablet 5   allopurinol  (ZYLOPRIM ) 100 MG tablet Take 2 tablets (200 mg total) by mouth daily. 3 month supply please 180 tablet 3   Bromelains 500 MG TABS Take 1 tablet by mouth daily.  (Patient not taking: Reported on 08/20/2024)     budesonide -formoterol  (SYMBICORT ) 160-4.5 MCG/ACT inhaler Inhale 2 puffs into the lungs 2 (two) times daily. (Patient not taking: Reported on 08/20/2024) 1 each 1   clobetasol  (TEMOVATE ) 0.05 % external solution Apply 1  application topically 2 (two) times daily. 7 days maximum. For scalp (Patient not taking: Reported on 08/20/2024) 50 mL 2   mometasone (ELOCON) 0.1 % cream Apply 1 application. topically daily.     No current facility-administered medications for this visit.     Objective:  BP 138/70   Pulse 63   Temp (!) 97.3 F (36.3 C) (Temporal)   Wt 229 lb 12.8 oz (104.2 kg)   SpO2 96%   BMI 29.50 kg/m  Gen: NAD, resting comfortably CV: RRR no murmurs rubs or gallops Lungs: CTAB no crackles, wheeze, rhonchi Ext: no edema Skin: warm, dry  Back - Normal skin, Spine with normal alignment and no deformity.  No tenderness to vertebral process palpation.  Paraspinous muscles are not tender and without spasm.  Patient is significantly tender over right SI joint Negative Straight leg raise.  Neuro- no saddle anesthesia, 5/5 strength lower extremities    Assessment and Plan   # Right low back pain S:a number of months ago  and went to bed just fine then woke up with pain shooting down into right hip. Went to chiropractor (had x-ray and did show some degeneration in right hip) and did e-stim and didn't feel it helped but eventually went away.  Feels a knot at times in area/slight swelling- isolates to right low back  near [facility] joint . No radiation into the legs. Not as bad this time. Was severe the first time. Intermittently grabbing him now A/P: Suspect SI joint dysfunction or potential arthritis.  Discussed home exercise program.  Offered sports medicine orthopedics consult but he wants to hold off for now unless he fails to improve  #hypertension/PVC's-  [facility]  S: medication: diltiazem  120 mg XR (generally controls PACs/PVC's) but in 2024 ranexa started by [facility] and helpful, lisinopril  20 mg -losartan can cause stuffy nose and with sinus issues wanted to be cautious BP Readings from Last 3 Encounters:  08/20/24 138/70  02/20/24 126/76  02/06/24 (!) 144/77  A/P: blood pressure high  acceptable - continue current medications .   #hyperlipidemia- myalgias so have not increased dose as well as prediabetes  S: Medication:rosuvastatin  10 mg daily  Lab Results  Component Value Date   CHOL 173 02/20/2024   HDL 74.90 02/20/2024   LDLCALC 83 02/20/2024   LDLDIRECT 110.0 08/06/2023   TRIG 79.0 02/20/2024   CHOLHDL 2 02/20/2024   A/P: close to ideal goal of LDL 70 or less at 83- plus statins slightly increase prediabetes risk so want to be cautious about increase  #Gout S: Medication: Allopurinol  100 mg started in 2024 with uric acid 7.4--> 200mg  As needed: Prednisone -as colchicine with interactions with Ranexa and diltiazem          Lab Results  Component Value Date   LABURIC 6.3 02/20/2024  A/P:hoping uric acid under 6 with increase- needs higher 90 day supply sent in for 200 mg dose- sent today   # Hyperglycemia/insulin resistance/prediabetes- a1c 5.31 January 2023 S:  Medication: none Exercise and diet- has pulled back on keto diet. Even with less aggressive dietary changes tends to hang around same weight 222 on home scale.  Wt Readings from Last 3 Encounters:  08/20/24 229 lb 12.8 oz (104.2 kg)  05/21/24 223 lb (101.2 kg)  02/20/24 223 lb (101.2 kg)   Lab Results  Component Value Date   HGBA1C 5.7 02/20/2024   HGBA1C 5.8 08/06/2023   HGBA1C 5.8 02/04/2023  A/P: hopefully stable- update a1c today. Continue without meds for now   Recommended follow up: Return in about 6 months (around 02/20/2025) for followup or sooner if needed.Schedule b4 you leave. Future Appointments  Date Time Provider Department Center  06/01/2025 11:20 AM [facility] ANNUAL WELLNESS VISIT 1 [facility] [place]   Lab/Order associations:   ICD-10-CM   1. Primary hypertension  I10 Comprehensive metabolic panel with GFR    CBC with Differential/Platelet    2. Hyperlipidemia, unspecified hyperlipidemia type  E78.5 Comprehensive metabolic panel with GFR    CBC with Differential/Platelet     3. PVC's (premature ventricular contractions)  I49.3     4. Prediabetes  R73.03 Hemoglobin A1c    5. Screening for diabetes mellitus  Z13.1 Hemoglobin A1c    6. Idiopathic chronic gout without tophus, unspecified site  M1A.00X0 Uric acid      Meds ordered this encounter  Medications   allopurinol  (ZYLOPRIM ) 100 MG tablet    Sig: Take 2 tablets (200 mg total) by mouth daily. 3 month supply please    Dispense:  180 tablet    Refill:  3    Return precautions advised.  Garnette Lukes, MD

## 2024-08-20 NOTE — Patient Instructions (Addendum)
 Please stop by lab before you go If you have mychart- we will send your results within 3 business days of us  receiving them.  If you do not have mychart- we will call you about results within 5 business days of us  receiving them.  *please also note that you will see labs on mychart as soon as they post. I will later go in and write notes on them- will say notes from Dr. Katrinka   Team please give him exercises for SI joint pain I want you to do the exercise 3x a week for a month then once a week for another month. Stop any exercise that causes more than 1-2/10 pain increase. If not doing better within 1-2 months let us  refer you to sports medicine or see your orthopedist  Recommended follow up: Return in about 6 months (around 02/20/2025) for followup or sooner if needed.Schedule b4 you leave.

## 2024-08-21 LAB — COMPREHENSIVE METABOLIC PANEL WITH GFR
ALT: 20 U/L (ref 0–53)
AST: 21 U/L (ref 0–37)
Albumin: 4.4 g/dL (ref 3.5–5.2)
Alkaline Phosphatase: 46 U/L (ref 39–117)
BUN: 14 mg/dL (ref 6–23)
CO2: 28 meq/L (ref 19–32)
Calcium: 9.6 mg/dL (ref 8.4–10.5)
Chloride: 101 meq/L (ref 96–112)
Creatinine, Ser: 1.15 mg/dL (ref 0.40–1.50)
GFR: 65.16 mL/min (ref >60.00)
Glucose, Bld: 98 mg/dL (ref 70–99)
Potassium: 4.1 meq/L (ref 3.5–5.1)
Sodium: 138 meq/L (ref 135–145)
Total Bilirubin: 0.4 mg/dL (ref 0.2–1.2)
Total Protein: 7.1 g/dL (ref 6.0–8.3)

## 2024-08-21 LAB — CBC WITH DIFFERENTIAL/PLATELET
Basophils Absolute: 0.1 K/uL (ref 0.0–0.1)
Basophils Relative: 1.1 % (ref 0.0–3.0)
Eosinophils Absolute: 0.2 K/uL (ref 0.0–0.7)
Eosinophils Relative: 3.5 % (ref 0.0–5.0)
HCT: 40.8 % (ref 39.0–52.0)
Hemoglobin: 13.6 g/dL (ref 13.0–17.0)
Lymphocytes Relative: 16.3 % (ref 12.0–46.0)
Lymphs Abs: 1.1 K/uL (ref 0.7–4.0)
MCHC: 33.3 g/dL (ref 30.0–36.0)
MCV: 95.4 fl (ref 78.0–100.0)
Monocytes Absolute: 0.7 K/uL (ref 0.1–1.0)
Monocytes Relative: 10 % (ref 3.0–12.0)
Neutro Abs: 4.5 K/uL (ref 1.4–7.7)
Neutrophils Relative %: 69.1 % (ref 43.0–77.0)
Platelets: 213 K/uL (ref 150.0–400.0)
RBC: 4.28 Mil/uL (ref 4.22–5.81)
RDW: 13.9 % (ref 11.5–15.5)
WBC: 6.6 K/uL (ref 4.0–10.5)

## 2024-08-21 LAB — HEMOGLOBIN A1C: Hgb A1c MFr Bld: 5.7 % (ref 4.6–6.5)

## 2024-08-21 LAB — URIC ACID: Uric Acid, Serum: 5.5 mg/dL (ref 4.0–7.8)

## 2024-08-22 ENCOUNTER — Ambulatory Visit: Payer: Self-pay | Admitting: Family Medicine

## 2024-09-01 ENCOUNTER — Other Ambulatory Visit: Payer: Self-pay

## 2024-09-01 MED ORDER — DILTIAZEM HCL ER COATED BEADS 120 MG PO CP24: 120.0000 mg | ORAL_CAPSULE | Freq: Every day | ORAL | 3 refills | Status: AC

## 2024-09-11 ENCOUNTER — Encounter: Payer: Self-pay | Admitting: Family Medicine

## 2024-09-11 ENCOUNTER — Ambulatory Visit
Admission: EM | Admit: 2024-09-11 | Discharge: 2024-09-11 | Disposition: A | Discharge status: Home / Self Care | Attending: Internal Medicine | Admitting: Internal Medicine

## 2024-09-11 ENCOUNTER — Other Ambulatory Visit: Payer: Self-pay | Admitting: Family Medicine

## 2024-09-11 ENCOUNTER — Other Ambulatory Visit: Payer: Self-pay

## 2024-09-11 DIAGNOSIS — R197 Diarrhea, unspecified: Secondary | ICD-10-CM | POA: Insufficient documentation

## 2024-09-11 DIAGNOSIS — K921 Melena: Secondary | ICD-10-CM | POA: Diagnosis not present

## 2024-09-11 DIAGNOSIS — R1030 Lower abdominal pain, unspecified: Secondary | ICD-10-CM | POA: Diagnosis not present

## 2024-09-11 LAB — POC HEMOCCULT BLD/STL (OFFICE/1-CARD/DIAGNOSTIC): Fecal Occult Blood, POC: POSITIVE — AB

## 2024-09-11 LAB — POCT URINE DIPSTICK
Bilirubin, UA: NEGATIVE
Blood, UA: NEGATIVE
Glucose, UA: NEGATIVE mg/dL
Ketones, POC UA: NEGATIVE mg/dL
Leukocytes, UA: NEGATIVE
Nitrite, UA: NEGATIVE
POC PROTEIN,UA: NEGATIVE
Spec Grav, UA: 1.025 (ref 1.010–1.025)
Urobilinogen, UA: 0.2 U/dL
pH, UA: 6 (ref 5.0–8.0)

## 2024-09-11 MED ORDER — ROSUVASTATIN CALCIUM 10 MG PO TABS: 10.0000 mg | ORAL_TABLET | Freq: Every day | ORAL | 3 refills | Status: DC

## 2024-09-11 MED ORDER — LISINOPRIL 20 MG PO TABS: 20.0000 mg | ORAL_TABLET | Freq: Every day | ORAL | 3 refills | Status: DC

## 2024-09-11 NOTE — Discharge Instructions (Addendum)
 Your blood work was sent to the lab for further testing. Someone from our office will call you with your results.  Please return a stool sample to the office whenever you can, so we can send it for further testing.   Recommend that you increase oral fluids such as water and Pedialyte. You should really push fluids. A bland diet is recommend at this time such as the B.R.A.T diet (Broth, Rice, Applesauce, Toast) as tolerated.  Please noted our office is limited in the tests and imaging we can perform at our facility. Please go directly to the Emergency Department immediately should you begin to feel worse in any way or have any of the following symptoms: increasing or different abdominal pain, persistent vomiting, inability to drink fluids, fevers, persistent bloody bowel movements, or begin vomiting blood.   I recommend you follow up with your PCP as well for recheck at the beginning of next week.

## 2024-09-11 NOTE — ED Notes (Signed)
 Chaperone visit for rectal exam with Ulanda Punches NP

## 2024-09-11 NOTE — ED Triage Notes (Signed)
 C/O thin, watery diarrhea with blood since last night. Patient states ten episodes of diarrhea. Patient denies abdominal pain at this time. Patient has hx of diverticulosis. Patient has some burning type feeling in throat.

## 2024-09-11 NOTE — ED Provider Notes (Signed)
 [facility]  Note:  This document was prepared using Dragon voice recognition software and may include unintentional dictation errors.  MRN: [id number] DOB: 12/31/1955 DATE: 09/11/24   Subjective:  Chief Complaint:  Chief Complaint  Patient presents with   Diarrhea     HPI: Ricardo Garcia is a 68 y.o. male presenting for lower abdominal pain and bloody diarrhea for less than one day. Patient states yesterday evening around 6pm he started with generalized lower abdominal pain. He states he soon after that developed loose, watery diarrhea. He reports that the first 3-4 stools were only loose stool, but he soon started having blood as well in his stools. He states he was up all night with bloody diarrhea. He states he had over 10 stools last night, but less than 20 stools. He reports that he did not sleep due to the being up with the diarrhea all night. The abdominal pain has since resolved. He reports no pain currently. Reports no nausea or vomiting. He does have a history of diverticulitis. Last colonoscopy appears to be in 2024 and he reports no polyps were noted. He does have a history of positive cologuard. He states that he has been on two antibiotics within the past 90 days. He has not traveled out of the country. Reports no one else with similar symptoms. Last meal was shrimp yesterday prior to onset of symptoms. He has not taken anything for his symptoms. Only abdominal surgery was a hernia repair when he was 68 years old. Denies fever, nausea/vomiting, dysuria, cough. Endorses diarrhea, bloody stools, lower abdominal pain. Presents NAD.  Prior to Admission medications   Medication Sig Start Date End Date Taking? Authorizing Provider  albuterol  (VENTOLIN  HFA) 108 (90 Base) MCG/ACT inhaler Inhale 2 puffs into the lungs every 6 (six) hours as needed for wheezing or shortness of breath. 01/23/24   Zalia Hautala P, PA-C  allopurinol  (ZYLOPRIM ) 100 MG tablet Take 2 tablets (200 mg total)  by mouth daily. 3 month supply please 08/20/24   Katrinka Garnette KIDD, MD  Bromelains 500 MG TABS Take 1 tablet by mouth daily.  Patient not taking: Reported on 08/20/2024    [provider]  budesonide -formoterol  (SYMBICORT ) 160-4.5 MCG/ACT inhaler Inhale 2 puffs into the lungs 2 (two) times daily. Patient not taking: Reported on 08/20/2024 02/06/24   Everado Pillsbury P, PA-C  clobetasol  (TEMOVATE ) 0.05 % external solution Apply 1 application topically 2 (two) times daily. 7 days maximum. For scalp Patient not taking: Reported on 08/20/2024 06/21/20   Katrinka Garnette KIDD, MD  diltiazem  (CARDIZEM  CD) 120 MG 24 hr capsule Take 1 capsule (120 mg total) by mouth daily. 09/01/24   Katrinka Garnette KIDD, MD  lisinopril  (ZESTRIL ) 20 MG tablet Take 1 tablet (20 mg total) by mouth daily. 06/08/24   Katrinka Garnette KIDD, MD  mometasone (ELOCON) 0.1 % cream Apply 1 application. topically daily.    [provider]  Multiple Vitamin (MULTIVITAMIN) capsule Take 1 capsule by mouth daily.    [provider]  ranolazine (RANEXA) 500 MG 12 hr tablet Take 500 mg by mouth 2 (two) times daily. 06/19/23   [provider]  rosuvastatin  (CRESTOR ) 10 MG tablet Take 1 tablet (10 mg total) by mouth daily. 06/08/24   Katrinka Garnette KIDD, MD  sildenafil  (VIAGRA ) 100 MG tablet Take 1 tablet (100 mg total) by mouth daily as needed for erectile dysfunction. 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   Diverticulitis    Hyperlipidemia    Hypertension    PONV (postoperative nausea and vomiting)    when he was 68 years old     Past Surgical History:  Procedure Laterality Date    bone spur removal     2016- foot   ARTHROTOMY Right 11/15/2015    Procedure: RIGHT ANKLE ARTHROTOMY ;  Surgeon: Maude Herald, MD;  Location: [facility] OR;  Service: Orthopedics;  Laterality: Right;   balloon septoplasty     COLONOSCOPY  2011   ELECTROMAGNETIC NAVIGATION BROCHOSCOPY  12/20/2016   EYE SURGERY  1999   Lasik   FOREIGN BODY REMOVAL Right 11/15/2015   Procedure: RIGHT ANKLE LOOSE BODY REMOVAL;  Surgeon: Maude Herald, MD;  Location: [facility] OR;  Service: Orthopedics;  Laterality: Right;   HERNIA REPAIR     68 years old   LUNG BIOPSY Right 03/07/2017   Procedure: RIGHT LUNG BIOPSY;  Surgeon: Elspeth JAYSON Millers, MD;  Location: [facility] OR;  Service: Thoracic;  Laterality: Right;   LYMPH NODE DISSECTION Right 03/07/2017   Procedure: LYMPH NODE DISSECTION, right lung;  Surgeon: Elspeth JAYSON Millers, MD;  Location: [facility] OR;  Service: Thoracic;  Laterality: Right;   SEGMENTECOMY Right 03/07/2017   Procedure: RIGHT LOWER LOBE SUPERIOR SEGMENTECTOMY;  Surgeon: Elspeth JAYSON Millers, MD;  Location: [facility] OR;  Service: Thoracic;  Laterality: Right;   VIDEO ASSISTED THORACOSCOPY (VATS)/WEDGE RESECTION Right 03/07/2017   Procedure: RIGHT LUNG VIDEO ASSISTED THORACOSCOPY (VATS)/WEDGE RESECTION;  Surgeon: Elspeth JAYSON Millers, MD;  Location: [facility] OR;  Service: Thoracic;  Laterality: Right;    Family History  Problem Relation Age of Onset   Hypertension Mother        uncontrolled   Stroke Mother        32   Other Mother        died- fell after stroke and broke ribs- lungs filled with fluid   Other Father        unknown cause of death age 53   Rheum arthritis Father    Arthritis Father    Early death Father    Other Brother        some step siblingsonly   Colon cancer Neg Hx    Colon polyps Neg Hx    Esophageal cancer Neg Hx    Rectal cancer Neg Hx    Stomach cancer Neg Hx     Social History   Tobacco Use   Smoking status: Never   Smokeless tobacco: Never  Vaping Use   Vaping status: Never Used  Substance Use Topics   Alcohol use: Yes    Alcohol/week: 8.0  standard drinks of alcohol    Types: 1 Glasses of wine, 7 Standard drinks or equivalent per week    Comment: occ   Drug use: No    Review of Systems  Constitutional:  Positive for appetite change. Negative for fever and unexpected weight change.  Respiratory:  Negative for cough.   Gastrointestinal:  Positive for abdominal pain, blood in stool and diarrhea. Negative for nausea, rectal pain and vomiting.  Genitourinary:  Negative for dysuria, flank pain and hematuria.  Neurological:  Negative for dizziness, syncope and light-headedness.     Objective:  Vitals: BP (!) 146/80 (BP Location: Right Arm)   Pulse 67   Temp 97.8 F (36.6 C) (Oral)   Resp 18   SpO2 95%   Physical Exam Exam conducted with a chaperone present Isidoro Knee, RN).  Constitutional:      General: He is not in acute distress.    Appearance: Normal appearance. He is well-developed and overweight. He is not ill-appearing or toxic-appearing.  HENT:     Head: Normocephalic and atraumatic.  Cardiovascular:     Rate and Rhythm: Normal rate and regular rhythm.     Heart sounds: Normal heart sounds.  Pulmonary:     Effort: Pulmonary effort is normal.     Breath sounds: Normal breath sounds.     Comments: Clear to auscultation bilaterally  Abdominal:     General: Bowel sounds are normal.     Palpations: Abdomen is soft.     Tenderness: There is no abdominal tenderness. There is no right CVA tenderness or left CVA tenderness. Negative signs include Murphy's sign.     Comments: No acute abdomen   Genitourinary:    Rectum: Guaiac result positive. No anal fissure, external hemorrhoid or internal hemorrhoid.  Skin:    General: Skin is warm and dry.  Neurological:     General: No focal deficit present.     Mental Status: He is alert.     GCS: GCS eye subscore is 4. GCS verbal subscore is 5. GCS motor subscore is 6.  Psychiatric:        Mood and Affect: Mood and affect normal.     Results:  Labs: Results  for orders placed or performed during the hospital encounter of 09/11/24 (from the past 24 hours)  POC Hemoccult Bld/Stl (1-Cd Office Dx)     Status: Abnormal   Collection Time: 09/11/24  8:44 AM  Result Value Ref Range   Card #1 Date     Fecal Occult Blood, POC Positive (A) Negative  POCT URINE DIPSTICK     Status: None   Collection Time: 09/11/24  9:03 AM  Result Value Ref Range   Color, UA yellow yellow   Clarity, UA clear clear   Glucose, UA negative negative mg/dL   Bilirubin, UA negative negative   Ketones, POC UA negative negative mg/dL   Spec Grav, UA 8.974 8.989 - 1.025   Blood, UA negative negative   pH, UA 6.0 5.0 - 8.0   POC PROTEIN,UA negative negative, trace   Urobilinogen, UA 0.2 0.2 or 1.0 E.U./dL   Nitrite, UA Negative Negative   Leukocytes, UA Negative Negative    Radiology: No results found.   UC Course/Treatments:  Procedures: Procedures   Medications Ordered in UC: Medications - No data to display   Assessment and Plan :     ICD-10-CM   1. Bloody diarrhea  R19.7 Comprehensive metabolic panel    CBC with Differential/Platelet    Gastrointestinal Panel by PCR , Stool    C Difficile Quick Screen w PCR reflex    Enteric precautions (UV disinfection)    Comprehensive metabolic panel    CBC with Differential/Platelet    Gastrointestinal Panel by PCR , Stool    C Difficile Quick Screen w PCR reflex    Enteric precautions (UV disinfection)    POC Hemoccult Bld/Stl (1-Cd Office Dx)    POC Hemoccult Bld/Stl (1-Cd Office Dx)    2. Lower abdominal pain  R10.30 POCT URINE DIPSTICK    POCT URINE DIPSTICK  Comprehensive metabolic panel    CBC with Differential/Platelet    Gastrointestinal Panel by PCR , Stool    C Difficile Quick Screen w PCR reflex    Enteric precautions (UV disinfection)    Comprehensive metabolic panel    CBC with Differential/Platelet    Gastrointestinal Panel by PCR , Stool    C Difficile Quick Screen w PCR reflex    Enteric  precautions (UV disinfection)    POC Hemoccult Bld/Stl (1-Cd Office Dx)    POC Hemoccult Bld/Stl (1-Cd Office Dx)     Bloody diarrhea Lower abdominal pain Afebrile, nontoxic-appearing, NAD. VSS. DDX includes but not limited to: gastroenteritis, colitis, IBD, IBS, e.coli., c.diff, viral etiology UA was normal. Hemoccult was positive. DRE unremarkable except for blood. CBC and CMP are pending to evaluate blood counts and electrolytes. Patient declined IV fluids today in office stating he would continue with oral hydration. No acute abdomen on exam and abdominal pain has seem to improve. Given on fevers and no active abdominal pain, will defer ABX until patient has returned stool sample for testing. At this point, feel that the risk out weigh benefits of ABX prior to testing results. Very strict ED guidelines given if lab work is critical, abdominal pain worsens, or bloody stools persists, then he will need to go to the ER. Recommend follow up with PCP next week for recheck and follow up. Strict ED precautions were given and patient verbalized understanding.  ED Discharge Orders     None        PDMP not reviewed this encounter.     Basilia Ulanda SQUIBB, PA-C 09/11/24 0940

## 2024-09-13 ENCOUNTER — Ambulatory Visit: Payer: Self-pay | Admitting: Internal Medicine

## 2024-09-13 LAB — CBC WITH DIFFERENTIAL/PLATELET
Basophils Absolute: 0.1 x10E3/uL (ref 0.0–0.2)
Basos: 1 %
EOS (ABSOLUTE): 0.3 x10E3/uL (ref 0.0–0.4)
Eos: 4 %
Hematocrit: 44.9 % (ref 37.5–51.0)
Hemoglobin: 14.8 g/dL (ref 13.0–17.7)
Immature Grans (Abs): 0 x10E3/uL (ref 0.0–0.1)
Immature Granulocytes: 0 %
Lymphocytes Absolute: 0.8 x10E3/uL (ref 0.7–3.1)
Lymphs: 11 %
MCH: 32.1 pg (ref 26.6–33.0)
MCHC: 33 g/dL (ref 31.5–35.7)
MCV: 97 fL (ref 79–97)
Monocytes Absolute: 0.6 x10E3/uL (ref 0.1–0.9)
Monocytes: 8 %
Neutrophils Absolute: 5.5 x10E3/uL (ref 1.4–7.0)
Neutrophils: 75 %
Platelets: 209 x10E3/uL (ref 150–450)
RBC: 4.61 x10E6/uL (ref 4.14–5.80)
RDW: 12.3 % (ref 11.6–15.4)
WBC: 7.3 x10E3/uL (ref 3.4–10.8)

## 2024-09-13 LAB — COMPREHENSIVE METABOLIC PANEL WITH GFR
ALT: 22 IU/L (ref 0–44)
AST: 29 IU/L (ref 0–40)
Albumin: 4.6 g/dL (ref 3.9–4.9)
Alkaline Phosphatase: 73 IU/L (ref 47–123)
BUN/Creatinine Ratio: 16 (ref 10–24)
BUN: 19 mg/dL (ref 8–27)
Bilirubin Total: 0.4 mg/dL (ref 0.0–1.2)
Calcium: 9.8 mg/dL (ref 8.6–10.2)
Chloride: 103 mmol/L (ref 96–106)
Creatinine, Ser: 1.22 mg/dL (ref 0.76–1.27)
Globulin, Total: 2.6 g/dL (ref 1.5–4.5)
Glucose: 139 mg/dL — ABNORMAL HIGH (ref 70–99)
Potassium: 4.8 mmol/L (ref 3.5–5.2)
Sodium: 140 mmol/L (ref 134–144)
Total Protein: 7.2 g/dL (ref 6.0–8.5)
eGFR: 64 mL/min/1.73 (ref >59)

## 2024-09-14 LAB — GASTROINTESTINAL PANEL BY PCR, STOOL (REPLACES STOOL CULTURE)

## 2024-11-30 ENCOUNTER — Encounter: Payer: Self-pay | Admitting: Family Medicine

## 2024-11-30 ENCOUNTER — Other Ambulatory Visit: Payer: Self-pay

## 2024-11-30 MED ORDER — ALLOPURINOL 100 MG PO TABS: 200.0000 mg | ORAL_TABLET | Freq: Every day | ORAL | 3 refills | Status: AC

## 2024-11-30 MED ORDER — ROSUVASTATIN CALCIUM 10 MG PO TABS: 10.0000 mg | ORAL_TABLET | Freq: Every day | ORAL | 3 refills | Status: AC

## 2024-11-30 MED ORDER — LISINOPRIL 20 MG PO TABS: 20.0000 mg | ORAL_TABLET | Freq: Every day | ORAL | 3 refills | Status: AC

## 2024-11-30 NOTE — Telephone Encounter (Signed)
[facility]

## 2025-02-22 ENCOUNTER — Ambulatory Visit: Admitting: Family Medicine

## 2025-06-01 ENCOUNTER — Ambulatory Visit
